# Patient Record
Sex: Male | Born: 1955 | Race: Black or African American | Hispanic: No | Marital: Single | Smoking: Former smoker
Health system: Southern US, Community
[De-identification: ages and names within clinical notes are randomized; demographics above are authoritative.]

## PROBLEM LIST (undated history)

## (undated) DIAGNOSIS — I10 Essential (primary) hypertension: Secondary | ICD-10-CM

## (undated) DIAGNOSIS — I209 Angina pectoris, unspecified: Secondary | ICD-10-CM

## (undated) DIAGNOSIS — F1010 Alcohol abuse, uncomplicated: Secondary | ICD-10-CM

## (undated) DIAGNOSIS — N189 Chronic kidney disease, unspecified: Secondary | ICD-10-CM

## (undated) DIAGNOSIS — C61 Malignant neoplasm of prostate: Secondary | ICD-10-CM

## (undated) DIAGNOSIS — E785 Hyperlipidemia, unspecified: Secondary | ICD-10-CM

## (undated) HISTORY — PX: PROSTATE BIOPSY: SHX241

## (undated) HISTORY — DX: Hyperlipidemia, unspecified: E78.5

## (undated) HISTORY — DX: Alcohol abuse, uncomplicated: F10.10

---

## 2002-03-24 ENCOUNTER — Emergency Department (HOSPITAL_COMMUNITY): Admission: EM | Admit: 2002-03-24 | Discharge: 2002-03-24 | Payer: Self-pay | Admitting: Emergency Medicine

## 2004-10-02 ENCOUNTER — Emergency Department (HOSPITAL_COMMUNITY): Admission: EM | Admit: 2004-10-02 | Discharge: 2004-10-02 | Payer: Self-pay | Admitting: Family Medicine

## 2006-09-27 ENCOUNTER — Inpatient Hospital Stay (HOSPITAL_COMMUNITY): Admission: EM | Admit: 2006-09-27 | Discharge: 2006-09-29 | Payer: Self-pay | Admitting: Family Medicine

## 2006-10-17 ENCOUNTER — Ambulatory Visit: Payer: Self-pay | Admitting: Family Medicine

## 2006-11-03 ENCOUNTER — Ambulatory Visit: Payer: Self-pay | Admitting: Family Medicine

## 2006-11-03 LAB — CONVERTED CEMR LAB
Creatinine,U: 170.1 mg/dL
Hgb A1c MFr Bld: 6.2 % — ABNORMAL HIGH (ref 4.6–6.0)
Microalb Creat Ratio: 1.2 mg/g (ref 0.0–30.0)
Microalb, Ur: 0.2 mg/dL (ref 0.0–1.9)

## 2006-11-23 ENCOUNTER — Encounter: Admission: RE | Admit: 2006-11-23 | Discharge: 2007-02-21 | Payer: Self-pay | Admitting: Family Medicine

## 2007-09-29 DIAGNOSIS — I10 Essential (primary) hypertension: Secondary | ICD-10-CM

## 2011-08-02 ENCOUNTER — Ambulatory Visit (INDEPENDENT_AMBULATORY_CARE_PROVIDER_SITE_OTHER): Payer: BC Managed Care – PPO | Admitting: Family Medicine

## 2011-08-02 ENCOUNTER — Encounter: Payer: Self-pay | Admitting: Family Medicine

## 2011-08-02 DIAGNOSIS — E119 Type 2 diabetes mellitus without complications: Secondary | ICD-10-CM

## 2011-08-02 LAB — POCT URINALYSIS DIPSTICK
Leukocytes, UA: NEGATIVE
Nitrite, UA: NEGATIVE
Spec Grav, UA: 1.015
Urobilinogen, UA: 0.2
pH, UA: 5

## 2011-08-02 LAB — GLUCOSE, POCT (MANUAL RESULT ENTRY): POC Glucose: 460

## 2011-08-02 MED ORDER — METFORMIN HCL 1000 MG PO TABS: 1000.0000 mg | ORAL_TABLET | Freq: Two times a day (BID) | ORAL | Status: DC

## 2011-08-02 MED ORDER — POLYETHYLENE GLYCOL 3350 17 GM/SCOOP PO POWD: 17.0000 g | Freq: Every day | ORAL | Status: AC

## 2011-08-02 NOTE — Progress Notes (Signed)
  Subjective:    Patient ID: Kenneth [name], male    DOB: 1956-11-18, 55 y.o.   MRN: [id number]  HPI 55 yr old male to re-establish with [place] after an absence of almost 5 years. He had lost his medical insurance and so stopped seeing [place], but recently he got insurance again. 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 He has diabetes, and when we last saw him in 2007 he was doing well on Avandamet and Lantus. His last A1c then was 6.2. He has been off all meds for years now, and it sounds like he does not watch his diet very closely. He does not check his glucoses either. He has felt very fatigued for the past few months, he has lost some weight, and he has been very constipated.    Review of Systems  Constitutional: Positive for unexpected weight change.  Respiratory: Negative.   Cardiovascular: Negative.   Gastrointestinal: Positive for constipation. Negative for nausea, vomiting, abdominal pain and abdominal distention.       Objective:   Physical Exam  Constitutional: He appears well-developed and well-nourished.  Neck: No thyromegaly present.  Cardiovascular: Normal rate, regular rhythm, normal heart sounds and intact distal pulses.   Pulmonary/Chest: Effort normal and breath sounds normal.  Abdominal: Soft. Bowel sounds are normal. He exhibits no distension and no mass. There is no tenderness. There is no rebound and no guarding.  Lymphadenopathy:    He has no cervical adenopathy.          Assessment & Plan:  His diabetes is out of control, and this probably explains the fatigue and the weight loss. Get back on Metformin again, and get more detailed labs today. Add Miralax for  the constipation. Recheck in one week.

## 2011-08-03 ENCOUNTER — Inpatient Hospital Stay (HOSPITAL_COMMUNITY)
Admission: EM | Admit: 2011-08-03 | Discharge: 2011-08-05 | DRG: 566 | Disposition: A | Discharge status: Home / Self Care | Payer: BC Managed Care – PPO | Attending: Internal Medicine | Admitting: Internal Medicine

## 2011-08-03 ENCOUNTER — Emergency Department (HOSPITAL_COMMUNITY): Payer: BC Managed Care – PPO

## 2011-08-03 DIAGNOSIS — E1310 Other specified diabetes mellitus with ketoacidosis without coma: Principal | ICD-10-CM | POA: Diagnosis present

## 2011-08-03 DIAGNOSIS — Z794 Long term (current) use of insulin: Secondary | ICD-10-CM

## 2011-08-03 DIAGNOSIS — N179 Acute kidney failure, unspecified: Secondary | ICD-10-CM | POA: Diagnosis present

## 2011-08-03 DIAGNOSIS — Z91199 Patient's noncompliance with other medical treatment and regimen due to unspecified reason: Secondary | ICD-10-CM

## 2011-08-03 DIAGNOSIS — E785 Hyperlipidemia, unspecified: Secondary | ICD-10-CM | POA: Diagnosis present

## 2011-08-03 DIAGNOSIS — E876 Hypokalemia: Secondary | ICD-10-CM | POA: Diagnosis present

## 2011-08-03 DIAGNOSIS — I10 Essential (primary) hypertension: Secondary | ICD-10-CM | POA: Diagnosis present

## 2011-08-03 DIAGNOSIS — Z833 Family history of diabetes mellitus: Secondary | ICD-10-CM

## 2011-08-03 DIAGNOSIS — Z9119 Patient's noncompliance with other medical treatment and regimen: Secondary | ICD-10-CM

## 2011-08-03 DIAGNOSIS — F17200 Nicotine dependence, unspecified, uncomplicated: Secondary | ICD-10-CM | POA: Diagnosis present

## 2011-08-03 LAB — URINALYSIS, ROUTINE W REFLEX MICROSCOPIC
Glucose, UA: >1000 mg/dL — AB
Ketones, ur: >80 mg/dL — AB
Leukocytes, UA: NEGATIVE
Nitrite: NEGATIVE
Protein, ur: 30 mg/dL — AB
Specific Gravity, Urine: 1.025 (ref 1.005–1.030)
Urobilinogen, UA: 0.2 mg/dL (ref 0.0–1.0)
pH: 5 (ref 5.0–8.0)

## 2011-08-03 LAB — DIFFERENTIAL
Basophils Absolute: 0 10*3/uL (ref 0.0–0.1)
Basophils Relative: 0 % (ref 0–1)
Eosinophils Absolute: 0 10*3/uL (ref 0.0–0.7)
Eosinophils Relative: 0 % (ref 0–5)
Lymphocytes Relative: 12 % (ref 12–46)
Lymphs Abs: 2 10*3/uL (ref 0.7–4.0)
Monocytes Absolute: 0.9 10*3/uL (ref 0.1–1.0)
Monocytes Relative: 5 % (ref 3–12)
Neutro Abs: 13.9 10*3/uL — ABNORMAL HIGH (ref 1.7–7.7)
Neutrophils Relative %: 83 % — ABNORMAL HIGH (ref 43–77)

## 2011-08-03 LAB — CBC
HCT: 43.6 % (ref 39.0–52.0)
Hemoglobin: 15.3 g/dL (ref 13.0–17.0)
MCH: 32.8 pg (ref 26.0–34.0)
MCHC: 35.1 g/dL (ref 30.0–36.0)
MCV: 93.4 fL (ref 78.0–100.0)
Platelets: 355 10*3/uL (ref 150–400)
RBC: 4.67 MIL/uL (ref 4.22–5.81)
RDW: 11.9 % (ref 11.5–15.5)
WBC: 16.8 10*3/uL — ABNORMAL HIGH (ref 4.0–10.5)

## 2011-08-03 LAB — POCT I-STAT 3, ART BLOOD GAS (G3+)
Acid-base deficit: 21 mmol/L — ABNORMAL HIGH (ref 0.0–2.0)
Bicarbonate: 6.7 mEq/L — ABNORMAL LOW (ref 20.0–24.0)
O2 Saturation: 94 %
Patient temperature: 98
TCO2: 7 mmol/L (ref 0–100)
pCO2 arterial: 21.2 mmHg — ABNORMAL LOW (ref 35.0–45.0)
pH, Arterial: 7.105 — CL (ref 7.350–7.450)
pO2, Arterial: 91 mmHg (ref 80.0–100.0)

## 2011-08-03 LAB — COMPREHENSIVE METABOLIC PANEL
ALT: 17 U/L (ref 0–53)
AST: 7 U/L (ref 0–37)
Albumin: 4.5 g/dL (ref 3.5–5.2)
Alkaline Phosphatase: 98 U/L (ref 39–117)
BUN: 17 mg/dL (ref 6–23)
CO2: 10 mEq/L — CL (ref 19–32)
Calcium: 9.9 mg/dL (ref 8.4–10.5)
Chloride: 94 mEq/L — ABNORMAL LOW (ref 96–112)
Creatinine, Ser: 1.93 mg/dL — ABNORMAL HIGH (ref 0.50–1.35)
GFR calc Af Amer: 44 mL/min — ABNORMAL LOW (ref >60)
GFR calc non Af Amer: 37 mL/min — ABNORMAL LOW (ref >60)
Glucose, Bld: 555 mg/dL (ref 70–99)
Potassium: 4.9 mEq/L (ref 3.5–5.1)
Sodium: 134 mEq/L — ABNORMAL LOW (ref 135–145)
Total Bilirubin: 0.4 mg/dL (ref 0.3–1.2)
Total Protein: 8.7 g/dL — ABNORMAL HIGH (ref 6.0–8.3)

## 2011-08-03 LAB — BASIC METABOLIC PANEL
BUN: 14 mg/dL (ref 6–23)
CO2: 15 mEq/L — ABNORMAL LOW (ref 19–32)
Calcium: 9.3 mg/dL (ref 8.4–10.5)
Chloride: 97 mEq/L (ref 96–112)
Creatinine, Ser: 1.3 mg/dL (ref 0.4–1.5)
GFR: 73.71 mL/min (ref >60.00)
Glucose, Bld: 481 mg/dL — ABNORMAL HIGH (ref 70–99)
Potassium: 4.6 mEq/L (ref 3.5–5.1)
Sodium: 132 mEq/L — ABNORMAL LOW (ref 135–145)

## 2011-08-03 LAB — GLUCOSE, CAPILLARY
Glucose-Capillary: 503 mg/dL — ABNORMAL HIGH (ref 70–99)
Glucose-Capillary: 462 mg/dL — ABNORMAL HIGH (ref 70–99)

## 2011-08-03 LAB — CBC WITH DIFFERENTIAL/PLATELET
Basophils Absolute: 0.2 10*3/uL — ABNORMAL HIGH (ref 0.0–0.1)
Basophils Relative: 1.6 % (ref 0.0–3.0)
Eosinophils Absolute: 0 10*3/uL (ref 0.0–0.7)
Eosinophils Relative: 0.2 % (ref 0.0–5.0)
HCT: 40.7 % (ref 39.0–52.0)
Hemoglobin: 13.8 g/dL (ref 13.0–17.0)
Lymphocytes Relative: 24.4 % (ref 12.0–46.0)
Lymphs Abs: 2.2 10*3/uL (ref 0.7–4.0)
MCHC: 33.9 g/dL (ref 30.0–36.0)
MCV: 95.1 fl (ref 78.0–100.0)
Monocytes Absolute: 0.4 10*3/uL (ref 0.1–1.0)
Monocytes Relative: 4.5 % (ref 3.0–12.0)
Neutro Abs: 6.3 10*3/uL (ref 1.4–7.7)
Neutrophils Relative %: 69.3 % (ref 43.0–77.0)
Platelets: 266 10*3/uL (ref 150.0–400.0)
RBC: 4.28 Mil/uL (ref 4.22–5.81)
RDW: 12.4 % (ref 11.5–14.6)
WBC: 9.1 10*3/uL (ref 4.5–10.5)

## 2011-08-03 LAB — HEPATIC FUNCTION PANEL
ALT: 22 U/L (ref 0–53)
AST: 12 U/L (ref 0–37)
Albumin: 4.3 g/dL (ref 3.5–5.2)
Alkaline Phosphatase: 82 U/L (ref 39–117)
Bilirubin, Direct: 0.1 mg/dL (ref 0.0–0.3)
Total Bilirubin: 1.4 mg/dL — ABNORMAL HIGH (ref 0.3–1.2)
Total Protein: 7.8 g/dL (ref 6.0–8.3)

## 2011-08-03 LAB — POCT I-STAT 3, VENOUS BLOOD GAS (G3P V)
Acid-base deficit: 21 mmol/L — ABNORMAL HIGH (ref 0.0–2.0)
Bicarbonate: 9.8 mEq/L — ABNORMAL LOW (ref 20.0–24.0)
O2 Saturation: 40 %
TCO2: 11 mmol/L (ref 0–100)
pCO2, Ven: 41.3 mmHg — ABNORMAL LOW (ref 45.0–50.0)
pH, Ven: 6.984 — CL (ref 7.250–7.300)
pO2, Ven: 35 mmHg (ref 30.0–45.0)

## 2011-08-03 LAB — URINE MICROSCOPIC-ADD ON

## 2011-08-03 LAB — MICROALBUMIN / CREATININE URINE RATIO
Creatinine,U: 38.1 mg/dL
Microalb Creat Ratio: 11.6 mg/g (ref 0.0–30.0)
Microalb, Ur: 4.4 mg/dL — ABNORMAL HIGH (ref 0.0–1.9)

## 2011-08-03 LAB — HEMOGLOBIN A1C: Hgb A1c MFr Bld: 12.3 % — ABNORMAL HIGH (ref 4.6–6.5)

## 2011-08-03 LAB — TSH: TSH: 1.57 u[IU]/mL (ref 0.35–5.50)

## 2011-08-04 ENCOUNTER — Telehealth: Payer: Self-pay | Admitting: *Deleted

## 2011-08-04 ENCOUNTER — Inpatient Hospital Stay (HOSPITAL_COMMUNITY): Payer: BC Managed Care – PPO

## 2011-08-04 LAB — GLUCOSE, CAPILLARY
Glucose-Capillary: 301 mg/dL — ABNORMAL HIGH (ref 70–99)
Glucose-Capillary: 357 mg/dL — ABNORMAL HIGH (ref 70–99)
Glucose-Capillary: 445 mg/dL — ABNORMAL HIGH (ref 70–99)
Glucose-Capillary: 265 mg/dL — ABNORMAL HIGH (ref 70–99)
Glucose-Capillary: 248 mg/dL — ABNORMAL HIGH (ref 70–99)
Glucose-Capillary: 250 mg/dL — ABNORMAL HIGH (ref 70–99)
Glucose-Capillary: 253 mg/dL — ABNORMAL HIGH (ref 70–99)
Glucose-Capillary: 196 mg/dL — ABNORMAL HIGH (ref 70–99)
Glucose-Capillary: 237 mg/dL — ABNORMAL HIGH (ref 70–99)
Glucose-Capillary: 212 mg/dL — ABNORMAL HIGH (ref 70–99)

## 2011-08-04 LAB — LIPID PANEL
Cholesterol: 218 mg/dL — ABNORMAL HIGH (ref 0–200)
HDL: 40 mg/dL (ref >39)
LDL Cholesterol: 149 mg/dL — ABNORMAL HIGH (ref 0–99)
Total CHOL/HDL Ratio: 5.5 RATIO
Triglycerides: 147 mg/dL (ref <150)
VLDL: 29 mg/dL (ref 0–40)

## 2011-08-04 LAB — BASIC METABOLIC PANEL
BUN: 12 mg/dL (ref 6–23)
CO2: 12 mEq/L — ABNORMAL LOW (ref 19–32)
CO2: 18 mEq/L — ABNORMAL LOW (ref 19–32)
CO2: 22 mEq/L (ref 19–32)
Calcium: 9.2 mg/dL (ref 8.4–10.5)
Calcium: 9 mg/dL (ref 8.4–10.5)
Chloride: 109 mEq/L (ref 96–112)
Creatinine, Ser: 1.41 mg/dL — ABNORMAL HIGH (ref 0.50–1.35)
Creatinine, Ser: 1.14 mg/dL (ref 0.50–1.35)
Creatinine, Ser: 0.99 mg/dL (ref 0.50–1.35)
GFR calc Af Amer: >60 mL/min (ref >60)
GFR calc Af Amer: >60 mL/min (ref >60)
GFR calc non Af Amer: >60 mL/min (ref >60)
Glucose, Bld: 265 mg/dL — ABNORMAL HIGH (ref 70–99)
Potassium: 3.8 mEq/L (ref 3.5–5.1)
Sodium: 141 mEq/L (ref 135–145)
Sodium: 145 mEq/L (ref 135–145)

## 2011-08-04 LAB — CBC
HCT: 35.7 % — ABNORMAL LOW (ref 39.0–52.0)
Hemoglobin: 12.6 g/dL — ABNORMAL LOW (ref 13.0–17.0)
MCH: 31.7 pg (ref 26.0–34.0)
MCHC: 35.3 g/dL (ref 30.0–36.0)
RDW: 11.8 % (ref 11.5–15.5)

## 2011-08-04 LAB — HEMOGLOBIN A1C
Hgb A1c MFr Bld: 13 % — ABNORMAL HIGH (ref <5.7)
Mean Plasma Glucose: 326 mg/dL — ABNORMAL HIGH (ref <117)

## 2011-08-04 LAB — DIFFERENTIAL
Basophils Relative: 0 % (ref 0–1)
Eosinophils Relative: 0 % (ref 0–5)
Lymphocytes Relative: 19 % (ref 12–46)
Monocytes Absolute: 0.9 10*3/uL (ref 0.1–1.0)
Monocytes Relative: 8 % (ref 3–12)
Neutro Abs: 8.2 10*3/uL — ABNORMAL HIGH (ref 1.7–7.7)

## 2011-08-04 LAB — RAPID URINE DRUG SCREEN, HOSP PERFORMED
Amphetamines: NOT DETECTED
Barbiturates: NOT DETECTED
Benzodiazepines: NOT DETECTED
Cocaine: NOT DETECTED
Opiates: NOT DETECTED
Tetrahydrocannabinol: NOT DETECTED

## 2011-08-04 LAB — CARDIAC PANEL(CRET KIN+CKTOT+MB+TROPI)
CK, MB: 5.2 ng/mL — ABNORMAL HIGH (ref 0.3–4.0)
Relative Index: 2.9 — ABNORMAL HIGH (ref 0.0–2.5)
Relative Index: 2.9 — ABNORMAL HIGH (ref 0.0–2.5)
Total CK: 180 U/L (ref 7–232)
Troponin I: <0.3 ng/mL (ref <0.30)

## 2011-08-04 LAB — MRSA PCR SCREENING: MRSA by PCR: NEGATIVE

## 2011-08-04 NOTE — Telephone Encounter (Signed)
[facility] Triage Call Report Triage Record Num: [id number] Operator: Audelia Hives Patient Name: Chandra Feger Call Date & Time: 08/03/2011 6:28:34PM Patient Phone: [phone] PCP: Tera Mater. Clent Ridges Patient Gender: Male PCP Fax : [phone] Patient DOB: 1956/09/10 [facility] Name[facility] Reason for Call: Greta, Significant Other, calling regarding Other. PCP is Bernie Covey number is [phone]. Greta/Spouse calling to report that she is in route to the ED [place]. Spoke with RN earlier and pt did not want to go via EMS. Pt has been vomiting and BS is elevated. See prior triage. Protocol(s) Used: Office Note Recommended Outcome per Protocol: Information Noted and Sent to Office Reason for Outcome: Caller information to office Care Advice: ~ 08/03/2011 6:32:44PM Page 1 of 1 C

## 2011-08-04 NOTE — Telephone Encounter (Signed)
[facility] Triage Call Report Triage Record Num: [id number] Operator: Lodema Pilot Patient Name: Kenneth [name] Call Date & Time: 08/03/2011 5:21:57PM Patient Phone: [phone] PCP: Tera Mater. Clent Ridges Patient Gender: Male PCP Fax : [phone] Patient DOB: 1956-05-13 [facility] Name: [facility] Reason for Call: PCP is Bernie Covey number is [phone]. Greta, Significant Other, calling regarding DM and Vomiting. Pt was seen in office on Friday 07/30/11. Pt was prescribed Metformin and BG was 460. Pt took Metformin 08/02/11 HS and vomited. Pt was sent home from work today 08/03/11 due to weakness and vomiting. BG unknown. Per Diabetes: Gastrointestinal Problems, advised fiance to call 911. Care advice given. Protocol(s) Used: Diabetes: Gastrointestinal Problems Recommended Outcome per Protocol: Activate EMS 911 Reason for Outcome: New or worsening signs and symptoms that may indicate shock Care Advice: ~ If testing equipment is available, measure blood sugar AFTER calling EMS 911. ~ An adult should stay with the patient, preferably one trained in CPR. ~ If available, bring recent log of blood sugars or bring blood glucose monitor with log of blood sugars. Lay the person down and elevate legs at least 12 inches (30 cm) above level of heart. Cover to help maintain body temperature. ~ ~ IMMEDIATE ACTION Write down provider's name. List or place the following in a bag for transport with the patient: current prescription and/or nonprescription medications; alternative treatments, therapies and medications; and street drugs. ~ 08/03/2011 5:46:06PM Page 1 of 1 [id number]

## 2011-08-05 ENCOUNTER — Telehealth: Payer: Self-pay | Admitting: Family Medicine

## 2011-08-05 LAB — BASIC METABOLIC PANEL
BUN: 9 mg/dL (ref 6–23)
CO2: 20 mEq/L (ref 19–32)
Calcium: 8.2 mg/dL — ABNORMAL LOW (ref 8.4–10.5)
Creatinine, Ser: 0.88 mg/dL (ref 0.50–1.35)
Glucose, Bld: 222 mg/dL — ABNORMAL HIGH (ref 70–99)

## 2011-08-05 LAB — CBC
Hemoglobin: 10.1 g/dL — ABNORMAL LOW (ref 13.0–17.0)
MCH: 32.1 pg (ref 26.0–34.0)
MCV: 89.5 fL (ref 78.0–100.0)
RBC: 3.15 MIL/uL — ABNORMAL LOW (ref 4.22–5.81)

## 2011-08-05 LAB — GLUCOSE, CAPILLARY

## 2011-08-05 NOTE — Telephone Encounter (Signed)
Spoke with pt and gave results. 

## 2011-08-05 NOTE — Telephone Encounter (Signed)
Message copied by Baldemar Friday on Thu Aug 05, 2011  1:00 PM ------      Message from: Gershon Crane A      Created: Thu Aug 05, 2011  5:45 AM       Labs are normal except for his diabetes, which is quite out of control. See me again this week, drink lots of water. He will need to be on insulin from now on.

## 2011-08-09 ENCOUNTER — Ambulatory Visit (INDEPENDENT_AMBULATORY_CARE_PROVIDER_SITE_OTHER): Payer: BC Managed Care – PPO | Admitting: Family Medicine

## 2011-08-09 ENCOUNTER — Encounter: Payer: Self-pay | Admitting: Family Medicine

## 2011-08-09 VITALS — BP 110/64 | HR 88 | Temp 98.4°F | Wt 203.0 lb

## 2011-08-09 DIAGNOSIS — E119 Type 2 diabetes mellitus without complications: Secondary | ICD-10-CM

## 2011-08-09 MED ORDER — LISINOPRIL 5 MG PO TABS: 2.5000 mg | ORAL_TABLET | Freq: Every day | ORAL | Status: DC

## 2011-08-09 MED ORDER — INSULIN GLARGINE 100 UNIT/ML ~~LOC~~ SOLN: 20.0000 [IU] | Freq: Every day | SUBCUTANEOUS | Status: DC

## 2011-08-09 MED ORDER — INSULIN ASPART 100 UNIT/ML ~~LOC~~ SOLN: 10.0000 [IU] | Freq: Three times a day (TID) | SUBCUTANEOUS | Status: DC

## 2011-08-09 NOTE — Progress Notes (Signed)
  Subjective:    Patient ID: Kenneth [name], male    DOB: [birth year], 55 y.o.   MRN: [id number]  HPI Here to follow up on an overnight stay at [facility] from 08-03-11 to 08-04-11 for DKA and newly diagnosed diabetes. His A1c was 13.0. He was started on insulin, Lisinopril, and Zocor. He feels much better now. His glucoses at home are running in the 200s most of the time.    Review of Systems  Constitutional: Negative.   Respiratory: Negative.   Cardiovascular: Negative.        Objective:   Physical Exam  Constitutional: He appears well-developed and well-nourished.  Cardiovascular: Normal rate, regular rhythm, normal heart sounds and intact distal pulses.   Pulmonary/Chest: Effort normal and breath sounds normal. No respiratory distress. He has no wheezes. He has no rales. He exhibits no tenderness.          Assessment & Plan:  He is doing better. We will adjust the insulins as above. Recheck in 2 weeks.

## 2011-08-10 LAB — CULTURE, BLOOD (ROUTINE X 2)
Culture  Setup Time: 201208150916
Culture: NO GROWTH

## 2011-08-24 ENCOUNTER — Encounter: Payer: Self-pay | Admitting: Family Medicine

## 2011-08-24 ENCOUNTER — Ambulatory Visit (INDEPENDENT_AMBULATORY_CARE_PROVIDER_SITE_OTHER): Payer: BC Managed Care – PPO | Admitting: Family Medicine

## 2011-08-24 VITALS — BP 108/62 | HR 81 | Temp 98.3°F | Wt 201.0 lb

## 2011-08-24 DIAGNOSIS — E119 Type 2 diabetes mellitus without complications: Secondary | ICD-10-CM

## 2011-08-24 DIAGNOSIS — I10 Essential (primary) hypertension: Secondary | ICD-10-CM

## 2011-08-24 MED ORDER — SIMVASTATIN 20 MG PO TABS: 20.0000 mg | ORAL_TABLET | Freq: Every day | ORAL | Status: DC

## 2011-08-24 MED ORDER — LISINOPRIL 2.5 MG PO TABS: 2.5000 mg | ORAL_TABLET | Freq: Every day | ORAL | Status: DC

## 2011-08-24 NOTE — Progress Notes (Signed)
  Subjective:    Patient ID: Kenneth [name], male    DOB: February 14, 1956, 55 y.o.   MRN: [id number]  HPI Here to follow up on diabetes. He feels well, and he has made some big adjustments to his diet. He has decreased his dose of Novolog from 10 units to 7. His fasting glucoses usually run 120-140.   Review of Systems  Constitutional: Negative.   Respiratory: Negative.   Cardiovascular: Negative.        Objective:   Physical Exam  Constitutional: He appears well-developed and well-nourished.  Cardiovascular: Normal rate, regular rhythm, normal heart sounds and intact distal pulses.   Pulmonary/Chest: Effort normal and breath sounds normal.          Assessment & Plan:  He seems to be stable now. Continue current meds and recheck with an A1c in early November.

## 2011-08-24 NOTE — H&P (Signed)
Kenneth [name], Kenneth [name] NO.:  [id number]  MEDICAL RECORD NO.:  [id number]  LOCATION:  [facility]                         FACILITY:  [facility]  PHYSICIAN:  Eduard Clos, MDDATE OF BIRTH:  07-May-1956  DATE OF ADMISSION:  08/03/2011 DATE OF DISCHARGE:                             HISTORY & PHYSICAL   PRIMARY CARE PHYSICIAN:  Jeannett Senior A. Clent Ridges, MD  CHIEF COMPLAINT:  Increased blood sugar.  HISTORY OF PRESENT ILLNESS:  This is a 55 year old male with a history diabetes mellitus type 2 who was not taking him medicines for almost last 2 years having increasing weakness, urinary frequency, and fatigue and gone to his primary care yesterday and was prescribed some medications tablet.  Previously, he was on Lantus and metformin which he has not been taking for almost 2 years due to insurance issues.  In the ER, the patient was found to have blood sugars in the 500s with anion gap.  The patient is admitted for DKA.  The patient denies any chest pain, shortness of breath, cough, or phlegm.  Denies any dizziness, loss of consciousness, or any focal deficits.  He has been having nausea and vomiting.  Denies any abdominal pain, any dysuria, discharge, or diarrhea.  He has been having constipation.  Denies any headache or visual symptoms or any difficulty speaking or swallowing.  PAST MEDICAL HISTORY:  Diabetes mellitus type 2.  SOCIAL HISTORY:  The patient smokes cigarettes.  He has been advised to quit smoking.  Drinks alcohol occasionally, usually 1-2 beers a week. Denies any drug abuse.  He is married, lives with his wife.  FAMILY HISTORY:  Significant for diabetes in both sides of the family.  ALLERGIES:  No known drug allergies.  REVIEW OF SYSTEMS:  As per history of present illness, nothing else significant.  PHYSICAL EXAMINATION:  GENERAL:  The patient is examined bedside, not in acute distress. VITAL SIGNS:  Blood pressure 120/60, pulse 119 per minute,  temperature 98.3, respirations 20 per minute, and O2 sat is 98%. HEENT:  Anicteric.  No pallor.  No discharge from ears, eyes, nose, or mouth. CHEST:  Bilateral entry present.  No rhonchi.  No crepitation. HEART:  S1 and S2 heard. ABDOMEN:  Soft and nontender.  Bowel sounds heard. CENTRAL NERVOUS SYSTEM:  Alert, awake, and oriented to time, place, and person.  Moves upper and lower extremities 5/5. EXTREMITIES:  Peripheral pulses are felt.  No edema.  LABORATORY DATA:  EKG has been ordered.  Abdominal x-ray shows no significant abnormality identified.  ABG:  PH is 7.1, pCO2 is 21.2, pO2 is 91, and oxygen saturation 94% on room air.  CBC:  WBC 16.8, hemoglobin is 15.3, hematocrit 43.6, and platelets 335.  Basic metabolic panel:  Sodium 134, potassium 4.9, chloride 94, carbon dioxide 10, anion gap is 30, glucose 555, BUN 17, creatinine 1.9, total bilirubin is 0.4, alkaline phosphatase 98, AST 7, ALT 17, total protein 8.7, albumin 4.5, calcium 9.9.  Hemoglobin A1c is 12.3.  Microalbumin is 4.4.  UA shows glucose more than 1000, ketones more than 80, bilirubin small, blood moderate, nitrites negative, leukocytes negative, squamous cells rare, granular and hyaline casts, wbc 0, bacteria rare.  ASSESSMENT: 1. Diabetic ketoacidosis. 2. Acute renal failure. 3. Nausea and vomiting probably from diabetic ketoacidosis reason.  PLAN: 1. At this time, we will admit the patient to intensive care unit. 2. For DKA, at this time we are going to aggressively hydrate the     patient with IV fluids.  The patient will be on DKA protocol with     IV insulin with frequent BMET checks.  We will repeat his CBC and     mag level again in the a.m.  We will keep the patient on strict     intake, output, and daily weights.  His acute renal failure is     probably from his dehydration which again I think will improve with     hydration.  As advised earlier, we will closely follow strict     intake, output,  and daily weight. 3. He is in DKA probably because he did not take his medications.  I     do not see any definite protruding cause.  At this time, the     patient is having no chest pain or cough or phlegm and he is not     febrile.  But in any case, I am going to check cardiac enzymes.  I     am going to check EKG.  We will continue with his IV insulin until     his anion gap is corrected.  The patient will need diabetes     education.     Eduard Clos, MD     ANK/MEDQ  D:  08/03/2011  T:  08/03/2011  Job:  [id number]  cc:   Jeannett Senior A. Clent Ridges, MD  Electronically Signed by Midge Minium MD on 08/24/2011 09:24:52 AM

## 2011-09-15 NOTE — Discharge Summary (Signed)
  Kenneth, [name]              ACCOUNT NO.:  [id number]  MEDICAL RECORD NO.:  [id number]  LOCATION:  4501                         FACILITY:  [facility]  PHYSICIAN:  Rafia Shedden I Nou Chard, MD      DATE OF BIRTH:  24-Apr-1956  DATE OF ADMISSION:  08/03/2011 DATE OF DISCHARGE:  08/05/2011                              DISCHARGE SUMMARY   PRIMARY CARE PHYSICIAN:  Jeannett Senior A. Clent Ridges, MD  DISCHARGE DIAGNOSES: 1. Diabetic ketoacidosis, resolved. 2. Diabetes mellitus with hemoglobin A1c of 13. 3. Noncompliance. 4. Acute renal failure, resolved. 5. Hypokalemia. 6. Hyperlipidemia.  DISCHARGE MEDICATIONS: 1. Insulin Lantus 20 units subcu daily. 2. NovoLog 3 units subcu t.i.d. every morning. 3. Lisinopril 2.5 mg p.o. daily. 4. Zocor 20 mg p.o. at bedtime.  PROCEDURE: 1. Chest x-ray, chronic bibasilar atelectasis scarring, no acute     cardiopulmonary process. 2. Abdominal x-ray, no significant abnormality.  CONSULTATION:  None.  HISTORY OF PRESENT ILLNESS:  This is a 55 year old male with a history diabetes mellitus type 2 who was not taking his medications for almost last 2 years presented with weakness and increased urine frequency, and fatigue.  The patient presented to the ED, found to have a blood sugar of 500 with anion gap and accordingly, the patient admitted.  His vitals signs, blood pressure 120/60, pulse rate 190, temperature 98.3.  The patient found to have a BUN of 17, creatinine 1.9, sodium 134, potassium 4.9, carbon dioxide 10, anion gap of 30, total protein 8.7, albumin 4.5, and calcium 9.9 and had microalbumin of 4.4.  Urinalysis, glucose more than 1000 and ketone more than 80.  Diagnosis of diabetic ketoacidosis made. 1. The patient DKA followed standard protocol of insulin IV.  Anion     gap closed and the patient started on insulin Lantus 15 units     subcu.  He is still with significant CBG around 206 and 273.     Accordingly, insulin Lantus increased to 20 units subcu  daily and     NovoLog 3 unit with meal done during hospital stay.  Education done     and the patient admitted.  He does not need any education regarding     insulin administration.  Currently, we felt the patient is stable     to be discharge, need to follow up closely with his primary care     physician for further adjustment of his insulin.     [name], MD     HIE/MEDQ  D:  08/05/2011  T:  08/05/2011  Job:  [id number]  cc:   Jeannett Senior A. Clent Ridges, MD  Electronically Signed by Ebony Cargo MD on 09/15/2011 10:27:01 AM

## 2011-09-20 ENCOUNTER — Other Ambulatory Visit: Payer: Self-pay | Admitting: Family Medicine

## 2011-09-20 NOTE — Telephone Encounter (Signed)
Pt needs refill on novolog call into [facility] [phone]

## 2011-09-22 MED ORDER — INSULIN ASPART 100 UNIT/ML ~~LOC~~ SOLN: 10.0000 [IU] | Freq: Three times a day (TID) | SUBCUTANEOUS | Status: DC

## 2011-09-22 NOTE — Telephone Encounter (Signed)
rx called into pharmacy

## 2011-09-22 NOTE — Telephone Encounter (Signed)
Pt will be out of novolog today.

## 2011-10-25 ENCOUNTER — Ambulatory Visit: Payer: BC Managed Care – PPO | Admitting: Family Medicine

## 2011-10-28 ENCOUNTER — Encounter: Payer: Self-pay | Admitting: Family Medicine

## 2011-10-28 ENCOUNTER — Ambulatory Visit (INDEPENDENT_AMBULATORY_CARE_PROVIDER_SITE_OTHER): Payer: BC Managed Care – PPO | Admitting: Family Medicine

## 2011-10-28 VITALS — BP 130/82 | HR 83 | Temp 98.5°F | Wt 217.0 lb

## 2011-10-28 DIAGNOSIS — E119 Type 2 diabetes mellitus without complications: Secondary | ICD-10-CM

## 2011-10-28 DIAGNOSIS — E785 Hyperlipidemia, unspecified: Secondary | ICD-10-CM | POA: Insufficient documentation

## 2011-10-28 DIAGNOSIS — I10 Essential (primary) hypertension: Secondary | ICD-10-CM

## 2011-10-28 LAB — HEMOGLOBIN A1C: Hgb A1c MFr Bld: 6.3 % (ref 4.6–6.5)

## 2011-10-28 MED ORDER — INSULIN ASPART 100 UNIT/ML ~~LOC~~ SOLN: 7.0000 [IU] | Freq: Three times a day (TID) | SUBCUTANEOUS | Status: DC

## 2011-10-28 MED ORDER — LISINOPRIL 2.5 MG PO TABS: 2.5000 mg | ORAL_TABLET | Freq: Every day | ORAL | Status: DC

## 2011-10-28 MED ORDER — INSULIN GLARGINE 100 UNIT/ML ~~LOC~~ SOLN: 15.0000 [IU] | Freq: Every day | SUBCUTANEOUS | Status: DC

## 2011-10-28 MED ORDER — SIMVASTATIN 20 MG PO TABS: 20.0000 mg | ORAL_TABLET | Freq: Every day | ORAL | Status: DC

## 2011-10-28 NOTE — Progress Notes (Signed)
  Subjective:    Patient ID: Kenneth [name], male    DOB: 12-09-1956, 55 y.o.   MRN: [id number]  HPI Here to follow up on diabetes. He feels well. His glucoses run from 120 to 150 most of the time.   Review of Systems  Constitutional: Negative.   Respiratory: Negative.   Cardiovascular: Negative.        Objective:   Physical Exam  Constitutional: He appears well-developed and well-nourished.  Neck: No thyromegaly present.  Cardiovascular: Normal rate, regular rhythm, normal heart sounds and intact distal pulses.   Pulmonary/Chest: Effort normal and breath sounds normal. No respiratory distress. He has no wheezes. He has no rales. He exhibits no tenderness.  Lymphadenopathy:    He has no cervical adenopathy.          Assessment & Plan:  Get an A1c. It sounds like he is doing well.

## 2011-11-01 NOTE — Progress Notes (Signed)
Quick Note:  Spoke with pt and gave results. Also put future lab order in computer. ______

## 2011-11-01 NOTE — Progress Notes (Signed)
Addended by: Aniceto Boss A on: 11/01/2011 03:32 PM   Modules accepted: Orders

## 2011-12-27 ENCOUNTER — Other Ambulatory Visit: Payer: Self-pay | Admitting: Internal Medicine

## 2012-02-05 ENCOUNTER — Inpatient Hospital Stay (HOSPITAL_COMMUNITY)
Admission: EM | Admit: 2012-02-05 | Discharge: 2012-02-08 | DRG: 566 | Disposition: A | Discharge status: Home / Self Care | Payer: BC Managed Care – PPO | Attending: Internal Medicine | Admitting: Internal Medicine

## 2012-02-05 ENCOUNTER — Other Ambulatory Visit: Payer: Self-pay

## 2012-02-05 ENCOUNTER — Emergency Department (HOSPITAL_COMMUNITY): Payer: BC Managed Care – PPO

## 2012-02-05 ENCOUNTER — Encounter (HOSPITAL_COMMUNITY): Payer: Self-pay | Admitting: Emergency Medicine

## 2012-02-05 DIAGNOSIS — I10 Essential (primary) hypertension: Secondary | ICD-10-CM | POA: Diagnosis present

## 2012-02-05 DIAGNOSIS — E785 Hyperlipidemia, unspecified: Secondary | ICD-10-CM | POA: Diagnosis present

## 2012-02-05 DIAGNOSIS — E870 Hyperosmolality and hypernatremia: Secondary | ICD-10-CM | POA: Diagnosis present

## 2012-02-05 DIAGNOSIS — Z794 Long term (current) use of insulin: Secondary | ICD-10-CM

## 2012-02-05 DIAGNOSIS — Z720 Tobacco use: Secondary | ICD-10-CM | POA: Diagnosis present

## 2012-02-05 DIAGNOSIS — E1110 Type 2 diabetes mellitus with ketoacidosis without coma: Secondary | ICD-10-CM | POA: Diagnosis present

## 2012-02-05 DIAGNOSIS — F1010 Alcohol abuse, uncomplicated: Secondary | ICD-10-CM | POA: Diagnosis present

## 2012-02-05 DIAGNOSIS — Z6827 Body mass index (BMI) 27.0-27.9, adult: Secondary | ICD-10-CM

## 2012-02-05 DIAGNOSIS — E1310 Other specified diabetes mellitus with ketoacidosis without coma: Principal | ICD-10-CM | POA: Diagnosis present

## 2012-02-05 DIAGNOSIS — F17200 Nicotine dependence, unspecified, uncomplicated: Secondary | ICD-10-CM | POA: Diagnosis present

## 2012-02-05 DIAGNOSIS — D72829 Elevated white blood cell count, unspecified: Secondary | ICD-10-CM | POA: Diagnosis present

## 2012-02-05 DIAGNOSIS — E875 Hyperkalemia: Secondary | ICD-10-CM | POA: Diagnosis present

## 2012-02-05 DIAGNOSIS — J209 Acute bronchitis, unspecified: Secondary | ICD-10-CM | POA: Diagnosis present

## 2012-02-05 DIAGNOSIS — Z23 Encounter for immunization: Secondary | ICD-10-CM

## 2012-02-05 DIAGNOSIS — N179 Acute kidney failure, unspecified: Secondary | ICD-10-CM | POA: Diagnosis present

## 2012-02-05 HISTORY — DX: Essential (primary) hypertension: I10

## 2012-02-05 HISTORY — DX: Angina pectoris, unspecified: I20.9

## 2012-02-05 HISTORY — DX: Chronic kidney disease, unspecified: N18.9

## 2012-02-05 LAB — BASIC METABOLIC PANEL
BUN: 29 mg/dL — ABNORMAL HIGH (ref 6–23)
CO2: 8 mEq/L — CL (ref 19–32)
CO2: 5 mEq/L — CL (ref 19–32)
Chloride: 84 mEq/L — ABNORMAL LOW (ref 96–112)
Chloride: 102 mEq/L (ref 96–112)
Creatinine, Ser: 2 mg/dL — ABNORMAL HIGH (ref 0.50–1.35)
Creatinine, Ser: 1.4 mg/dL — ABNORMAL HIGH (ref 0.50–1.35)
GFR calc Af Amer: 65 mL/min — ABNORMAL LOW (ref >90)
Glucose, Bld: 434 mg/dL — ABNORMAL HIGH (ref 70–99)
Potassium: 5.7 mEq/L — ABNORMAL HIGH (ref 3.5–5.1)

## 2012-02-05 LAB — URINALYSIS, ROUTINE W REFLEX MICROSCOPIC
Leukocytes, UA: NEGATIVE
Protein, ur: NEGATIVE mg/dL
Urobilinogen, UA: 0.2 mg/dL (ref 0.0–1.0)

## 2012-02-05 LAB — GLUCOSE, CAPILLARY: Glucose-Capillary: 493 mg/dL — ABNORMAL HIGH (ref 70–99)

## 2012-02-05 LAB — URINE MICROSCOPIC-ADD ON

## 2012-02-05 LAB — KETONES, QUALITATIVE

## 2012-02-05 LAB — DIFFERENTIAL
Basophils Absolute: 0 10*3/uL (ref 0.0–0.1)
Lymphocytes Relative: 8 % — ABNORMAL LOW (ref 12–46)
Monocytes Absolute: 1.3 10*3/uL — ABNORMAL HIGH (ref 0.1–1.0)
Neutro Abs: 18.2 10*3/uL — ABNORMAL HIGH (ref 1.7–7.7)

## 2012-02-05 LAB — CBC
HCT: 38.2 % — ABNORMAL LOW (ref 39.0–52.0)
Hemoglobin: 12.4 g/dL — ABNORMAL LOW (ref 13.0–17.0)
RDW: 12.8 % (ref 11.5–15.5)
WBC: 21.2 10*3/uL — ABNORMAL HIGH (ref 4.0–10.5)

## 2012-02-05 MED ORDER — SODIUM CHLORIDE 0.9 % IV BOLUS (SEPSIS)
1000.0000 mL | Freq: Once | INTRAVENOUS | Status: AC
  Administered 2012-02-05: 1000 mL via INTRAVENOUS

## 2012-02-05 MED ORDER — SODIUM CHLORIDE 0.9 % IV SOLN
INTRAVENOUS | Status: DC
  Administered 2012-02-05: 5.4 [IU]/h via INTRAVENOUS
  Filled 2012-02-05: qty 1

## 2012-02-05 MED ORDER — ENOXAPARIN SODIUM 40 MG/0.4ML ~~LOC~~ SOLN
40.0000 mg | SUBCUTANEOUS | Status: DC
  Administered 2012-02-06 – 2012-02-07 (×3): 40 mg via SUBCUTANEOUS
  Filled 2012-02-05 (×5): qty 0.4

## 2012-02-05 MED ORDER — SODIUM CHLORIDE 0.9 % IV SOLN: INTRAVENOUS | Status: DC

## 2012-02-05 MED ORDER — SODIUM CHLORIDE 0.9 % IV SOLN
INTRAVENOUS | Status: DC
  Administered 2012-02-06: 02:00:00 via INTRAVENOUS
  Filled 2012-02-05 (×2): qty 1

## 2012-02-05 MED ORDER — INSULIN REGULAR BOLUS VIA INFUSION: 5.0000 [IU] | Freq: Three times a day (TID) | INTRAVENOUS | Status: DC

## 2012-02-05 MED ORDER — ONDANSETRON HCL 4 MG/2ML IJ SOLN: 4.0000 mg | Freq: Four times a day (QID) | INTRAMUSCULAR | Status: DC | PRN

## 2012-02-05 MED ORDER — ONDANSETRON HCL 4 MG/2ML IJ SOLN: 4.0000 mg | Freq: Three times a day (TID) | INTRAMUSCULAR | Status: AC | PRN

## 2012-02-05 MED ORDER — MORPHINE SULFATE 2 MG/ML IJ SOLN
2.0000 mg | INTRAMUSCULAR | Status: DC | PRN
  Administered 2012-02-06: 2 mg via INTRAVENOUS
  Filled 2012-02-05: qty 1

## 2012-02-05 MED ORDER — DEXTROSE-NACL 5-0.45 % IV SOLN
INTRAVENOUS | Status: DC
  Administered 2012-02-06: 1000 mL via INTRAVENOUS

## 2012-02-05 MED ORDER — HYDROMORPHONE HCL PF 1 MG/ML IJ SOLN
1.0000 mg | INTRAMUSCULAR | Status: AC | PRN
  Administered 2012-02-05: 1 mg via INTRAVENOUS
  Filled 2012-02-05: qty 1

## 2012-02-05 MED ORDER — DEXTROSE 50 % IV SOLN: 25.0000 mL | INTRAVENOUS | Status: DC | PRN

## 2012-02-05 MED ORDER — PANTOPRAZOLE SODIUM 40 MG IV SOLR
40.0000 mg | INTRAVENOUS | Status: DC
  Administered 2012-02-05 – 2012-02-06 (×2): 40 mg via INTRAVENOUS
  Filled 2012-02-05 (×3): qty 40

## 2012-02-05 MED ORDER — SODIUM CHLORIDE 0.9 % IV SOLN
INTRAVENOUS | Status: DC
  Administered 2012-02-05: 1000 mL via INTRAVENOUS

## 2012-02-05 MED ORDER — DEXTROSE-NACL 5-0.45 % IV SOLN: INTRAVENOUS | Status: DC

## 2012-02-05 MED ORDER — NICOTINE 21 MG/24HR TD PT24
21.0000 mg | MEDICATED_PATCH | Freq: Every day | TRANSDERMAL | Status: DC
  Administered 2012-02-06 – 2012-02-08 (×4): 21 mg via TRANSDERMAL
  Filled 2012-02-05 (×5): qty 1

## 2012-02-05 NOTE — ED Notes (Signed)
MD at bedside. EDPA Williams  

## 2012-02-05 NOTE — ED Notes (Signed)
Family at bedside. 

## 2012-02-05 NOTE — ED Provider Notes (Signed)
History     CSN: [id number]  Arrival date & time 02/05/12  1610   First MD Initiated Contact with Patient 02/05/12 1643      Chief Complaint  Patient presents with  . Hyperglycemia    (Consider location/radiation/quality/duration/timing/severity/associated sxs/prior treatment) HPI History is obtained from the patient and family member at bedside. He is an insulin-dependent diabetic. He states that he has been feeling poorly for the past 2 weeks with cough and congestion symptoms. He has been taking an over-the-counter decongestant and cough drops for his symptoms.   He is currently taking 7 units of NovoLog 3 times a day and 15 units of Lantus at bedtime daily for his diabetes. He states that he has not taken any insulin since Thursday morning due to feeling poorly. He began to gradually feel worse after stopping insulin, and developed nausea and vomiting late yesterday with accompanying generalized abdominal pain. Has vomited at least 10 times since yesterday. Has been feeling thirsty and had increased UOP.   States he has had to be hospitalized in the past several times for hyperglycemia.  Past Medical History  Diagnosis Date  . Diabetes mellitus   . Alcohol abuse     History reviewed. No pertinent past surgical history.  Family History  Problem Relation Age of Onset  . Diabetes Father   . Hypertension Father   . Alcohol abuse Father   . Diabetes Sister     History  Substance Use Topics  . Smoking status: Current Some Day Smoker    Types: Cigarettes  . Smokeless tobacco: Never Used  . Alcohol Use: Yes      Review of Systems  Constitutional: Positive for appetite change. Negative for fever and activity change.  HENT: Positive for rhinorrhea. Negative for sore throat and trouble swallowing.   Eyes: Negative.   Respiratory: Negative for chest tightness and shortness of breath.   Cardiovascular: Negative for chest pain and palpitations.  Gastrointestinal: Positive  for nausea, vomiting and abdominal pain. Negative for diarrhea.  Genitourinary: Negative for dysuria, decreased urine volume and difficulty urinating.  Musculoskeletal: Negative for myalgias.  Skin: Negative for color change and wound.  Neurological: Negative for dizziness and weakness.    Allergies  Review of patient's allergies indicates no known allergies.  Home Medications   Current Outpatient Rx  Name Route Sig Dispense Refill  . ACETAMINOPHEN 500 MG PO TABS Oral Take 1,000 mg by mouth every 6 (six) hours as needed. For pain    . INSULIN ASPART 100 UNIT/ML [place] SOLN Subcutaneous Inject 7 Units into the skin 3 (three) times daily before meals.    . INSULIN GLARGINE 100 UNIT/ML [place] SOLN Subcutaneous Inject 15 Units into the skin at bedtime.    Marland Kitchen LISINOPRIL 2.5 MG PO TABS Oral Take 2.5 mg by mouth every morning.    Marland Kitchen SIMVASTATIN 20 MG PO TABS Oral Take 20 mg by mouth at bedtime.    Letta Pate ULTRA SYSTEM W/DEVICE KIT Does not apply 1 kit by Does not apply route 3 (three) times daily.      Marland Kitchen LANCETS MISC Does not apply 1 each by Does not apply route 3 (three) times daily.        BP 110/57  Pulse 126  Temp(Src) 98.3 F (36.8 C) (Oral)  Resp 20  SpO2 97%  Physical Exam  Nursing note and vitals reviewed. Constitutional: He is oriented to person, place, and time. He appears well-developed and well-nourished. No distress.  Slightly somnolent, but arousable to speech and answering questions appropriately.  HENT:  Head: Normocephalic and atraumatic.  Eyes: Pupils are equal, round, and reactive to light.  Neck: Normal range of motion.  Cardiovascular: Regular rhythm and normal heart sounds.  Exam reveals no gallop and no friction rub.   No murmur heard.      tachycardic  Pulmonary/Chest: Effort normal and breath sounds normal. He has no wheezes. He exhibits no tenderness.  Abdominal: Soft. Bowel sounds are normal. There is no rebound and no guarding.       Generalized  tenderness to palpation, worst in the epigastrium  Neurological: He is oriented to person, place, and time. No cranial nerve deficit. GCS eye subscore is 3. GCS verbal subscore is 5. GCS motor subscore is 6.  Skin: Skin is warm and dry. No rash noted. He is not diaphoretic.    ED Course  Procedures (including critical care time)  CRITICAL CARE Performed by: Grant Fontana   Total critical care time: 30 min  Critical care time was exclusive of separately billable procedures and treating other patients.  Critical care was necessary to treat or prevent imminent or life-threatening deterioration.  Critical care was time spent personally by me on the following activities: development of treatment plan with patient and/or surrogate as well as nursing, discussions with consultants, evaluation of patient's response to treatment, examination of patient, obtaining history from patient or surrogate, ordering and performing treatments and interventions, ordering and review of laboratory studies, ordering and review of radiographic studies, pulse oximetry and re-evaluation of patient's condition.   Date: 02/05/2012  Rate: 123  Rhythm: sinus tachycardia  QRS Axis: normal  Intervals: normal  ST/T Wave abnormalities: normal  Conduction Disutrbances:none  Narrative Interpretation: borderline prolonged QTc  Old EKG Reviewed: compared with Aug 2012 - old with slight STE in anterolat leads, not present today  Labs Reviewed  GLUCOSE, CAPILLARY - Abnormal; Notable for the following:    Glucose-Capillary >600 (*)    All other components within normal limits  CBC - Abnormal; Notable for the following:    WBC 21.2 (*)    RBC 3.84 (*)    Hemoglobin 12.4 (*)    HCT 38.2 (*)    Platelets 404 (*)    All other components within normal limits  DIFFERENTIAL - Abnormal; Notable for the following:    Neutrophils Relative 86 (*)    Neutro Abs 18.2 (*)    Lymphocytes Relative 8 (*)    Monocytes  Absolute 1.3 (*)    All other components within normal limits  BASIC METABOLIC PANEL - Abnormal; Notable for the following:    Potassium 5.9 (*)    Chloride 84 (*)    CO2 8 (*)    Glucose, Bld 763 (*)    BUN 34 (*)    Creatinine, Ser 2.00 (*)    GFR calc non Af Amer 36 (*)    GFR calc Af Amer 42 (*)    All other components within normal limits  KETONES, QUALITATIVE - Abnormal; Notable for the following:    Acetone, Bld SMALL (*)    All other components within normal limits  GLUCOSE, CAPILLARY - Abnormal; Notable for the following:    Glucose-Capillary 589 (*)    All other components within normal limits  GLUCOSE, CAPILLARY - Abnormal; Notable for the following:    Glucose-Capillary 493 (*)    All other components within normal limits  URINALYSIS, ROUTINE W REFLEX MICROSCOPIC  BLOOD GAS, ARTERIAL  CARDIAC PANEL(CRET KIN+CKTOT+MB+TROPI)   No results found.   1. DKA (diabetic ketoacidoses)       MDM  5:09 PM Patient with blood sugar over 600 in the department. He is slightly somnolent, but able to answer questions. Plan to obtain basic blood work, aggressively rehydrate, and put on Glucomander protocol.  5:51 PM Basic metabolic panel shows hyperkalemia, hyperchloremia, a bicarbonate of 8, and glucose of 763. The anion gap is 44. DKA. Second line started with IVF bolus. Discussed with Dr. Estell Harpin. Plan to admit to [facility].      Grant Fontana, [place] 02/05/12 2100  Grant Fontana, [place] 02/05/12 2132

## 2012-02-05 NOTE — H&P (Signed)
PCP:   Kenneth Salisbury, MD, MD   Chief Complaint:  Nausea vomiting  HPI: Patient is a 56 year old Afro-American male past medical history of diabetes mellitus type 2 now on insulin for the past 7 years plus hypertension was actually been in relatively well controlled without any complications. In the past few weeks the patient has noted a mild cough but no other problems. In the last day, he started having complaints of abdominal cramping nausea and vomiting and diarrhea. Today when he could not stop throwing up, he came to the emergency room.  The emergency room patient was noted to be in acute renal failure with a creatinine of 2 was normal baseline is normal renal function. His blood sugars were in the 700s, his white blood cell count was 22 with 83% shift, his anion gap was markedly elevated at 43 and he showed signs of a significant DKA. His urinalysis and chest x-ray were unremarkable as were cardiac markers. Patient was started on IV fluids and IV insulin try hospitals were called for admission.  Review of Systems:  I saw the patient, he was doing okay. He complains of mild abdominal discomfort and mild nausea. Overall he said he felt much better than before. He denies any headaches, vision changes, dysphasia, chest pain, palpitations, shortness of breath or wheezing. He did note some coughing this past week but nothing productive. Again he does is a mild abdominal discomfort. No hematuria or dysuria or constipation. He was having some diarrhea yesterday but has not had any since. He denies any focal tenderness weakness or pain. He does feel quite fatigued. Review systems otherwise negative.  Past Medical History: Past Medical History  Diagnosis Date  . Diabetes mellitus   . Alcohol abuse    History reviewed. No pertinent past surgical history.  Medications: Prior to Admission medications   Medication Sig Start Date End Date Taking? Authorizing Provider  acetaminophen (TYLENOL) 500 MG  tablet Take 1,000 mg by mouth every 6 (six) hours as needed. For pain   Yes Historical Provider, MD  insulin aspart (NOVOLOG) 100 UNIT/ML injection Inject 7 Units into the skin 3 (three) times daily before meals. 10/28/11  Yes Kenneth Salisbury, MD  insulin glargine (LANTUS) 100 UNIT/ML injection Inject 15 Units into the skin at bedtime. 10/28/11  Yes Kenneth Salisbury, MD  lisinopril (PRINIVIL,ZESTRIL) 2.5 MG tablet Take 2.5 mg by mouth every morning. 10/28/11  Yes Kenneth Salisbury, MD  simvastatin (ZOCOR) 20 MG tablet Take 20 mg by mouth at bedtime. 10/28/11  Yes Kenneth Salisbury, MD  Blood Glucose Monitoring Suppl (ONE TOUCH ULTRA SYSTEM KIT) W/DEVICE KIT 1 kit by Does not apply route 3 (three) times daily.      Historical Provider, MD  Lancets MISC 1 each by Does not apply route 3 (three) times daily.      Historical Provider, MD    Allergies:  No Known Allergies  Social History:  reports that he has been smoking Cigarettes.  He has never used smokeless tobacco. He reports that he drinks alcohol. He reports that he does not use illicit drugs. The patient is normally at baseline able to participate in full activities of daily living without complaint. He lives at home with his wife..  Family History: Family History  Problem Relation Age of Onset  . Diabetes Father   . Hypertension Father   . Alcohol abuse Father   . Diabetes Sister     Physical Exam: Filed Vitals:   02/05/12  1800 02/05/12 1815 02/05/12 1901 02/05/12 2031  BP: 134/76  135/72 135/72  Pulse: 120 121 120 126  Temp:      TempSrc:      Resp: 21 20 20    SpO2: 98% 98% 97% 99%   HEENT: Normocephalic, atraumatic, mucous her meds are dry Cardiovascular: Regular rhythm, tachycardic Lungs:" Bilaterally Abdomen: Soft, generalized nonspecific tenderness, minimal distention, hypoactive bowel sounds Extremity, clubbing or cyanosis, trace pitting edema   Labs on Admission:   [facility] 02/05/12 1649  NA 136  K 5.9*  CL 84*  CO2 8*    GLUCOSE 763*  BUN 34*  CREATININE 2.00*  CALCIUM 9.8  MG --  PHOS --    Basename 02/05/12 1649  WBC 21.2*  NEUTROABS 18.2*  HGB 12.4*  HCT 38.2*  MCV 99.5  PLT 404*    Basename 02/05/12 1903  CKTOTAL 147  CKMB 3.3  CKMBINDEX --  TROPONINI <0.30    Radiological Exams on Admission: Dg Chest Portable 1 View 02/05/2012    IMPRESSION:  1. No acute cardiopulmonary disease. 2.  Increased density projecting over the medial right lung base is similar over numerous prior exams and likely an area of scarring.  Original Report Authenticated By: Consuello Bossier, M.D.    Assessment/Plan Present on Admission:  .HYPERTENSION: Holding antihypertensives given his volume depletion with DKA.  Marland KitchenHyperlipidemia: Stable. Holding his statin.  Marland KitchenDIABETES MELLITUS, TYPE II: Normally well controlled. His A1c in November was 6.9. Unclear what had caused this DKA episode. We'll continue to investigate.  .ARF (acute renal failure): Secondary DKA. Baseline is normal renal function. Continue to follow labs.  .DKA, type 2: Principal problem. On DKA protocol. Continue aggressive hydration plus IV insulin and serial basic metabolic panels.  Leukocytosis: In part from stress margination from DKA, although some may be an underlying infection which may have caused this to begin with. We'll continue to follow.  .Hyperkalemia: This likely reverse itself as his CBGs come down with IV insulin.  .Tobacco abuse: Given nicotine patch  After discussion with the patient, he is to be a full code.  We will respect these wishes.  I anticipate his length of stay to be 2-3 days based on history, physical exam and lab work.  Time spent on this patient including examination and decision-making process: 55 minutes.  Hollice Espy [phone] 02/05/2012, 8:52 PM

## 2012-02-05 NOTE — ED Provider Notes (Signed)
Medical screening examination/treatment/procedure(s) were performed by non-physician practitioner and as supervising physician I was immediately available for consultation/collaboration.   Benny Lennert, MD 02/05/12 [phone]

## 2012-02-05 NOTE — ED Notes (Signed)
Per spouse, pt has been sick w/chest sinus congestion x2 wks, pt has been taking OTC decongestant and sugar free cough drops, pt reports he began feeling weaker on Thursday, began vomiting last night, vomit x10 in last 2 days, increase urine output and thirst, last took his insulin on Thursday. Pt sleepy, easily aroused w/verbal stimuli

## 2012-02-05 NOTE — ED Notes (Signed)
Pt to ED c/o hyperglycemia.  St's he has not taken his insulin since [place]. Because he felt bad.  CBG in triage >600

## 2012-02-05 NOTE — ED Notes (Signed)
Patient denies pain and is resting comfortably.  

## 2012-02-05 NOTE — ED Provider Notes (Signed)
Pt  Has weakness and cough.  pe lungs clear.  Heart tachycardic.  Dx dka.  Admit  Medical screening examination/treatment/procedure(s) were conducted as a shared visit with non-physician practitioner(s) and myself.  I personally evaluated the patient during the encounter   Benny Lennert, MD 02/05/12 1810

## 2012-02-05 NOTE — ED Notes (Signed)
Pt knows that urine is needed 

## 2012-02-06 LAB — CBC
Hemoglobin: 10.9 g/dL — ABNORMAL LOW (ref 13.0–17.0)
MCHC: 34.1 g/dL (ref 30.0–36.0)
RBC: 3.43 MIL/uL — ABNORMAL LOW (ref 4.22–5.81)

## 2012-02-06 LAB — GLUCOSE, CAPILLARY
Glucose-Capillary: 139 mg/dL — ABNORMAL HIGH (ref 70–99)
Glucose-Capillary: 142 mg/dL — ABNORMAL HIGH (ref 70–99)
Glucose-Capillary: 156 mg/dL — ABNORMAL HIGH (ref 70–99)
Glucose-Capillary: 156 mg/dL — ABNORMAL HIGH (ref 70–99)
Glucose-Capillary: 181 mg/dL — ABNORMAL HIGH (ref 70–99)
Glucose-Capillary: 181 mg/dL — ABNORMAL HIGH (ref 70–99)
Glucose-Capillary: 191 mg/dL — ABNORMAL HIGH (ref 70–99)
Glucose-Capillary: 222 mg/dL — ABNORMAL HIGH (ref 70–99)
Glucose-Capillary: 253 mg/dL — ABNORMAL HIGH (ref 70–99)

## 2012-02-06 LAB — BASIC METABOLIC PANEL
BUN: 21 mg/dL (ref 6–23)
BUN: 13 mg/dL (ref 6–23)
CO2: 18 mEq/L — ABNORMAL LOW (ref 19–32)
CO2: 20 mEq/L (ref 19–32)
CO2: 21 mEq/L (ref 19–32)
CO2: 25 mEq/L (ref 19–32)
Calcium: 9.2 mg/dL (ref 8.4–10.5)
Calcium: 8.8 mg/dL (ref 8.4–10.5)
Chloride: 113 mEq/L — ABNORMAL HIGH (ref 96–112)
Creatinine, Ser: 1.09 mg/dL (ref 0.50–1.35)
GFR calc non Af Amer: >90 mL/min (ref >90)
Glucose, Bld: 230 mg/dL — ABNORMAL HIGH (ref 70–99)
Glucose, Bld: 187 mg/dL — ABNORMAL HIGH (ref 70–99)
Glucose, Bld: 170 mg/dL — ABNORMAL HIGH (ref 70–99)
Glucose, Bld: 172 mg/dL — ABNORMAL HIGH (ref 70–99)
Potassium: 3 mEq/L — ABNORMAL LOW (ref 3.5–5.1)
Sodium: 150 mEq/L — ABNORMAL HIGH (ref 135–145)
Sodium: 151 mEq/L — ABNORMAL HIGH (ref 135–145)

## 2012-02-06 MED ORDER — POTASSIUM CHLORIDE CRYS ER 20 MEQ PO TBCR
40.0000 meq | EXTENDED_RELEASE_TABLET | Freq: Once | ORAL | Status: AC
  Administered 2012-02-06: 40 meq via ORAL
  Filled 2012-02-06: qty 2

## 2012-02-06 MED ORDER — DM-GUAIFENESIN ER 30-600 MG PO TB12
1.0000 | ORAL_TABLET | Freq: Two times a day (BID) | ORAL | Status: DC
  Administered 2012-02-06 – 2012-02-08 (×5): 1 via ORAL
  Filled 2012-02-06 (×7): qty 1

## 2012-02-06 MED ORDER — INSULIN GLARGINE 100 UNIT/ML ~~LOC~~ SOLN
15.0000 [IU] | SUBCUTANEOUS | Status: AC
  Administered 2012-02-06: 15 [IU] via SUBCUTANEOUS

## 2012-02-06 MED ORDER — INSULIN ASPART 100 UNIT/ML ~~LOC~~ SOLN
7.0000 [IU] | Freq: Three times a day (TID) | SUBCUTANEOUS | Status: DC
  Administered 2012-02-07 (×2): 7 [IU] via SUBCUTANEOUS
  Filled 2012-02-06: qty 3

## 2012-02-06 MED ORDER — SIMVASTATIN 20 MG PO TABS
20.0000 mg | ORAL_TABLET | Freq: Every day | ORAL | Status: DC
  Administered 2012-02-06 – 2012-02-07 (×2): 20 mg via ORAL
  Filled 2012-02-06 (×3): qty 1

## 2012-02-06 MED ORDER — SODIUM CHLORIDE 0.9 % IV SOLN
INTRAVENOUS | Status: DC
  Administered 2012-02-06: 1000 mL via INTRAVENOUS

## 2012-02-06 MED ORDER — INSULIN GLARGINE 100 UNIT/ML ~~LOC~~ SOLN
15.0000 [IU] | Freq: Every day | SUBCUTANEOUS | Status: DC
  Filled 2012-02-06: qty 3

## 2012-02-06 MED ORDER — INSULIN GLARGINE 100 UNIT/ML ~~LOC~~ SOLN
15.0000 [IU] | Freq: Every day | SUBCUTANEOUS | Status: DC
  Administered 2012-02-06: 15 [IU] via SUBCUTANEOUS

## 2012-02-06 MED ORDER — LISINOPRIL 2.5 MG PO TABS: 2.5000 mg | ORAL_TABLET | Freq: Every morning | ORAL | Status: DC

## 2012-02-06 NOTE — Progress Notes (Addendum)
Subjective: He ran out of his Lantus 2 wks ago and has been taking Novolog with breakfast and lunch only. He developed a cough w/ yellow sputum about 1 wk ago. He noted his sugars going up about 2-3 days ago after he began a cough syrup.   Objective: Blood pressure 106/64, pulse 90, temperature 97.7 F (36.5 C), temperature source Oral, resp. rate 22, height 5\' 9"  (1.753 m), weight 83.462 kg (184 lb), SpO2 98.00%. Weight change:   Intake/Output Summary (Last 24 hours) at 02/06/12 1532 Last data filed at 02/06/12 1200  Gross per 24 hour  Intake   3440 ml  Output   2425 ml  Net   1015 ml    Physical Exam: General appearance: alert and cooperative Lungs: clear to auscultation bilaterally Heart: regular rate and rhythm, S1, S2 normal, no murmur, click, rub or gallop Abdomen: soft, non-tender; bowel sounds normal; no masses,  no organomegaly Skin: Skin color, texture, turgor normal. No rashes or lesions  Lab Results:  Basename 02/06/12 0926 02/06/12 0245  NA 151* 150*  K 4.1 4.1  CL 116* 114*  CO2 21 20  GLUCOSE 170* 187*  BUN 18 21  CREATININE 0.80 1.01  CALCIUM 8.8 9.2  MG -- --  PHOS -- --   No results found for this basename: AST:2,ALT:2,ALKPHOS:2,BILITOT:2,PROT:2,ALBUMIN:2 in the last 72 hours No results found for this basename: LIPASE:2,AMYLASE:2 in the last 72 hours  Basename 02/06/12 0245 02/05/12 1649  WBC 15.2* 21.2*  NEUTROABS -- 18.2*  HGB 10.9* 12.4*  HCT 32.0* 38.2*  MCV 93.3 99.5  PLT 311 404*    Basename 02/05/12 1903  CKTOTAL 147  CKMB 3.3  CKMBINDEX --  TROPONINI <0.30   No components found with this basename: POCBNP:3 No results found for this basename: DDIMER:2 in the last 72 hours No results found for this basename: HGBA1C:2 in the last 72 hours No results found for this basename: CHOL:2,HDL:2,LDLCALC:2,TRIG:2,CHOLHDL:2,LDLDIRECT:2 in the last 72 hours No results found for this basename: TSH,T4TOTAL,FREET3,T3FREE,THYROIDAB in the last 72  hours No results found for this basename: VITAMINB12:2,FOLATE:2,FERRITIN:2,TIBC:2,IRON:2,RETICCTPCT:2 in the last 72 hours  Micro Results: Recent Results (from the past 240 hour(s))  MRSA PCR SCREENING     Status: Normal   Collection Time   02/05/12 10:27 PM      Component Value Range Status Comment   MRSA by PCR NEGATIVE  NEGATIVE  Final     Studies/Results: Dg Chest Portable 1 View  02/05/2012  *RADIOLOGY REPORT*  Clinical Data: Hyperglycemia and weakness.  PORTABLE CHEST - 1 VIEW  Comparison: 08/04/2011.  Findings: Midline trachea.  Normal heart size.  Costophrenic angles are minimally excluded. Given this factor, no pleural effusion or pneumothorax.  Lung apices are clear.  An area of increased density projecting over the medial right lung base is similar over prior exams, including back to 09/27/2006.  Lower lobe predominant interstitial thickening may be related to smoking or chronic bronchitis.  IMPRESSION:  1. No acute cardiopulmonary disease. 2.  Increased density projecting over the medial right lung base is similar over numerous prior exams and likely an area of scarring.  Original Report Authenticated By: Consuello Bossier, M.D.    Medications: Scheduled Meds:   . dextromethorphan-guaiFENesin  1 tablet Oral BID  . enoxaparin  40 mg Subcutaneous Q24H  . nicotine  21 mg Transdermal Daily  . pantoprazole (PROTONIX) IV  40 mg Intravenous Q24H  . sodium chloride  1,000 mL Intravenous Once  . sodium chloride  1,000  mL Intravenous Once  . sodium chloride  1,000 mL Intravenous Once  . sodium chloride  1,000 mL Intravenous Once  . DISCONTD: insulin regular  5 Units Intravenous TID WC   Continuous Infusions:   . dextrose 5 % and 0.45% NaCl    . dextrose 5 % and 0.45% NaCl 125 mL/hr at 02/06/12 1200  . insulin (NOVOLIN-R) infusion 10.8 Units/hr (02/05/12 2043)  . insulin (NOVOLIN-R) infusion 4.1 Units/hr (02/06/12 0650)  . DISCONTD: sodium chloride    . DISCONTD: sodium chloride 125  mL/hr at 02/06/12 0300   PRN Meds:.dextrose, dextrose, HYDROmorphone (DILAUDID) injection, morphine injection, ondansetron (ZOFRAN) IV, ondansetron (ZOFRAN) IV  Assessment/Plan: Principal Problem:  *DKA, type 2- will switch off of insulin drip once gap has closed. Will resume home dose of Lantus and place him on TID novolog.   Active Problems:  Acute Bronchitis  Viral vs bacterial. If WBC count doesn't improve, may need to start a Quinolone tomorrow. He may have had the flu but as this has been going on for about 1 wk and he is improving now, he is not a candidate for tamiflu.    HYPERTENSION- cont to hold lisinopril due to hypotension  Hypernatremia- if sodium continues to increase on d51/2ns, then will switch to sterile water.    ARF (acute renal failure)- resolved  Hyperkalemia- resolved  Tobacco abuse  Hyperlipidemia   LOS: 1 day   [facility] [phone] 02/06/2012, 3:32 PM

## 2012-02-07 LAB — GLUCOSE, CAPILLARY
Glucose-Capillary: 382 mg/dL — ABNORMAL HIGH (ref 70–99)
Glucose-Capillary: 365 mg/dL — ABNORMAL HIGH (ref 70–99)
Glucose-Capillary: 197 mg/dL — ABNORMAL HIGH (ref 70–99)
Glucose-Capillary: 274 mg/dL — ABNORMAL HIGH (ref 70–99)
Glucose-Capillary: 303 mg/dL — ABNORMAL HIGH (ref 70–99)

## 2012-02-07 LAB — BASIC METABOLIC PANEL
BUN: 9 mg/dL (ref 6–23)
CO2: 25 mEq/L (ref 19–32)
Calcium: 8.5 mg/dL (ref 8.4–10.5)
Creatinine, Ser: 0.71 mg/dL (ref 0.50–1.35)

## 2012-02-07 MED ORDER — ASPIRIN 81 MG PO CHEW
81.0000 mg | CHEWABLE_TABLET | Freq: Every day | ORAL | Status: DC
  Administered 2012-02-07 – 2012-02-08 (×2): 81 mg via ORAL
  Filled 2012-02-07 (×2): qty 1

## 2012-02-07 MED ORDER — INSULIN ASPART 100 UNIT/ML ~~LOC~~ SOLN
0.0000 [IU] | Freq: Every day | SUBCUTANEOUS | Status: DC
  Administered 2012-02-07: 4 [IU] via SUBCUTANEOUS

## 2012-02-07 MED ORDER — POTASSIUM CHLORIDE CRYS ER 20 MEQ PO TBCR
40.0000 meq | EXTENDED_RELEASE_TABLET | Freq: Once | ORAL | Status: AC
  Administered 2012-02-07: 40 meq via ORAL
  Filled 2012-02-07: qty 2

## 2012-02-07 MED ORDER — INSULIN GLARGINE 100 UNIT/ML ~~LOC~~ SOLN
20.0000 [IU] | Freq: Every day | SUBCUTANEOUS | Status: DC
  Administered 2012-02-07: 20 [IU] via SUBCUTANEOUS

## 2012-02-07 MED ORDER — INSULIN ASPART 100 UNIT/ML ~~LOC~~ SOLN
0.0000 [IU] | Freq: Three times a day (TID) | SUBCUTANEOUS | Status: DC
  Administered 2012-02-07: 15 [IU] via SUBCUTANEOUS
  Administered 2012-02-08: 7 [IU] via SUBCUTANEOUS
  Administered 2012-02-08: 11 [IU] via SUBCUTANEOUS
  Administered 2012-02-08: 7 [IU] via SUBCUTANEOUS

## 2012-02-07 MED ORDER — INSULIN ASPART 100 UNIT/ML ~~LOC~~ SOLN
8.0000 [IU] | Freq: Three times a day (TID) | SUBCUTANEOUS | Status: DC
  Administered 2012-02-07 – 2012-02-08 (×4): 8 [IU] via SUBCUTANEOUS

## 2012-02-07 MED ORDER — LISINOPRIL 2.5 MG PO TABS
2.5000 mg | ORAL_TABLET | Freq: Every morning | ORAL | Status: DC
  Administered 2012-02-08: 2.5 mg via ORAL
  Filled 2012-02-07: qty 1

## 2012-02-07 MED ORDER — OXYCODONE HCL 5 MG PO TABS: 5.0000 mg | ORAL_TABLET | ORAL | Status: DC | PRN

## 2012-02-07 NOTE — Progress Notes (Signed)
[facility] [place] TEAM 8  Subjective: 56 year old male past medical history of diabetes mellitus type 2 now on insulin for the past 7 years started having complaints of abdominal cramping nausea and vomiting and diarrhea.  Resting comfortably at present.  Denies f/c, sob, n/v, or abdom pain.    Objective: Weight change:   Intake/Output Summary (Last 24 hours) at 02/07/12 1546 Last data filed at 02/07/12 1200  Gross per 24 hour  Intake    940 ml  Output   3000 ml  Net  -2060 ml   Blood pressure 119/79, pulse 85, temperature 97.8 F (36.6 C), temperature source Oral, resp. rate 18, height 5\' 9"  (1.753 m), weight 83.462 kg (184 lb), SpO2 96.00%.  Physical Exam: General: No acute respiratory distress Lungs: Clear to auscultation bilaterally without wheezes or crackles Cardiovascular: Regular rate and rhythm without murmur gallop or rub normal S1 and S2 Abdomen: Nontender, nondistended, soft, bowel sounds positive, no rebound, no ascites, no appreciable mass Extremities: No significant cyanosis, clubbing, or edema bilateral lower extremities  Lab Results:  Basename 02/07/12 0505 02/06/12 1555 02/06/12 0926  NA 139 143 151*  K 3.4* 3.0* 4.1  CL 106 109 116*  CO2 25 25 21   GLUCOSE 203* 172* 170*  BUN 9 13 18   CREATININE 0.71 0.72 0.80  CALCIUM 8.5 8.6 8.8  MG -- -- --  PHOS -- -- --    Basename 02/06/12 0245 02/05/12 1649  WBC 15.2* 21.2*  NEUTROABS -- 18.2*  HGB 10.9* 12.4*  HCT 32.0* 38.2*  MCV 93.3 99.5  PLT 311 404*    Basename 02/05/12 1903  CKTOTAL 147  CKMB 3.3  CKMBINDEX --  TROPONINI <0.30   Micro Results: Recent Results (from the past 240 hour(s))  MRSA PCR SCREENING     Status: Normal   Collection Time   02/05/12 10:27 PM      Component Value Range Status Comment   MRSA by PCR NEGATIVE  NEGATIVE  Final     Studies/Results: All recent x-ray/radiology reports have been reviewed in detail.   Medications: I have reviewed the patient's  complete medication list.  Assessment/Plan:  Acute renal failure  Resolved  Uncontrolled DM/DKA Ran out of his lantus 2 weeks ago - gap now closed - off insulin gtt - CBG still not at goal/is erratic - adjust tx plan and follow CBG - is on an ACE and asa as well   Acute bronchitis Sx have all but resolved - do not feel abx will be necessary  hypernatremia Resolved  EtOH abuse Advised to abstain  HTN Well controlled at present - follow trend   Hyperlipidemia On medical tx  Leukocytosis Likely due to DKA - CBC was not ordered for today - will recheck in AM  Tobacco abuse Advised to abstain  Mild hypokalemia Replace and recheck in AM  Dispo Stable for transfer to floor - d/c home as soon as CBG better controlled  Lonia Blood, MD [facility] Office  [phone] Pager [phone]  On-Call/Text Page:      [email or web address]      password [email or web address]

## 2012-02-07 NOTE — Progress Notes (Addendum)
Inpatient Diabetes Program Recommendations  AACE/ADA: New Consensus Statement on Inpatient Glycemic Control (2009)  Target Ranges:  Prepandial:   less than 140 mg/dL      Peak postprandial:   less than 180 mg/dL (1-2 hours)      Critically ill patients:  140 - 180 mg/dL   Results for Kenneth [name], Kenneth [name] (MRN [id number]) as of 02/07/2012 09:16  Ref. Range 02/07/2012 08:12  Glucose-Capillary Latest Range: 70-99 mg/dL 409 (H)    Inpatient Diabetes Program Recommendations Correction (SSI): Please start Novolog Moderate correction scale (SSI) tid ac + HS.  Spoke with patient about why he ran out of insulin.  Pt told me he gets his Novolog and Lantus at a local pharmacy and could not get to the pharmacy before it closed after work.  Reminded pt of the importance of taking insulin to control CBGs and prevent long-term and acute complications.  Pt stated that he checks his CBGs bid at home.  Needs to make a follow-up appt with his primary MD (Dr. Clent Ridges) in about 1 month.  Last A1C was 6.3% (10/28/11).    Note: will follow. Ambrose Finland RN, MSN, CDE Diabetes Coordinator Inpatient Diabetes Program [phone]

## 2012-02-07 NOTE — Progress Notes (Signed)
Utilization Review Completed.Kenneth [name] T2/18/2013   

## 2012-02-07 NOTE — Progress Notes (Signed)
Pt. Being transferred to [id number] via wheelchair. Pt. As well as his girlfriend are aware of the new room. Phone report called to Tammy,RN.

## 2012-02-08 LAB — CBC
Hemoglobin: 9.2 g/dL — ABNORMAL LOW (ref 13.0–17.0)
MCH: 32.2 pg (ref 26.0–34.0)
MCV: 93 fL (ref 78.0–100.0)
RBC: 2.86 MIL/uL — ABNORMAL LOW (ref 4.22–5.81)

## 2012-02-08 LAB — BASIC METABOLIC PANEL
CO2: 30 mEq/L (ref 19–32)
GFR calc non Af Amer: >90 mL/min (ref >90)
Glucose, Bld: 219 mg/dL — ABNORMAL HIGH (ref 70–99)
Potassium: 3.3 mEq/L — ABNORMAL LOW (ref 3.5–5.1)
Sodium: 139 mEq/L (ref 135–145)

## 2012-02-08 LAB — GLUCOSE, CAPILLARY
Glucose-Capillary: 212 mg/dL — ABNORMAL HIGH (ref 70–99)
Glucose-Capillary: 261 mg/dL — ABNORMAL HIGH (ref 70–99)
Glucose-Capillary: 227 mg/dL — ABNORMAL HIGH (ref 70–99)

## 2012-02-08 MED ORDER — INSULIN GLARGINE 100 UNIT/ML ~~LOC~~ SOLN: 25.0000 [IU] | Freq: Every day | SUBCUTANEOUS | Status: DC

## 2012-02-08 MED ORDER — INSULIN ASPART 100 UNIT/ML ~~LOC~~ SOLN: 10.0000 [IU] | Freq: Three times a day (TID) | SUBCUTANEOUS | Status: DC

## 2012-02-09 NOTE — Discharge Summary (Signed)
DISCHARGE SUMMARY  Kenneth [name]  MR#: [id number]  DOB:1956-04-25  Date of Admission: 02/05/2012 Date of Discharge: 02/09/2012  Attending Physician:Kenneth [name]  Patient's PCP:Kenneth A, MD, MD  Consults: none  Presenting Complaint: Nausea and vomiting  Discharge Diagnoses: Principal Problem:  *DKA, type 2 Active Problems: Acute Viral bronchitis ARF due to dehydration  HYPERTENSION  ARF (acute renal failure)  DIABETES MELLITUS, TYPE II  Hyperlipidemia  Hyperkalemia  Tobacco abuse    Discharge Medications: Medication List  As of 02/09/2012  9:55 PM   TAKE these medications         acetaminophen 500 MG tablet   Commonly known as: TYLENOL   Take 1,000 mg by mouth every 6 (six) hours as needed. For pain      insulin aspart 100 UNIT/ML injection   Commonly known as: novoLOG   Inject 10 Units into the skin 3 (three) times daily before meals.      insulin glargine 100 UNIT/ML injection   Commonly known as: LANTUS   Inject 25 Units into the skin at bedtime.      Lancets Misc   1 each by Does not apply route 3 (three) times daily.      lisinopril 2.5 MG tablet   Commonly known as: PRINIVIL,ZESTRIL   Take 2.5 mg by mouth every morning.      ONE TOUCH ULTRA SYSTEM KIT W/DEVICE Kit   1 kit by Does not apply route 3 (three) times daily.      simvastatin 20 MG tablet   Commonly known as: ZOCOR   Take 20 mg by mouth at bedtime.             Procedures: Dg Chest Portable 1 View  02/05/2012  *RADIOLOGY REPORT*  Clinical Data: Hyperglycemia and weakness.  PORTABLE CHEST - 1 VIEW  Comparison: 08/04/2011.  Findings: Midline trachea.  Normal heart size.  Costophrenic angles are minimally excluded. Given this factor, no pleural effusion or pneumothorax.  Lung apices are clear.  An area of increased density projecting over the medial right lung base is similar over prior exams, including back to 09/27/2006.  Lower lobe predominant interstitial thickening may be  related to smoking or chronic bronchitis.  IMPRESSION:  1. No acute cardiopulmonary disease. 2.  Increased density projecting over the medial right lung base is similar over numerous prior exams and likely an area of scarring.  Original Report Authenticated By: Consuello Bossier, M.D.    echocardiogram  Hospital Course This is [name] 56 y/o insluin dependant diabetic who ran out of his Lantus about 2-3 weeks prior to admission. About [name] week prior to admission he developed [name] cold and [name] congested cough. He noticed that his sugars began to creep up until they did not register on his glucometer. He developed vomiting and then presented to the ER and was noted to be in DKA.   His metabolic disturbance was corrected with an Insulin drip, IVF and potassium replacement. His vomiting resolved and he is now tolerating [name] regular diet. His sugars have been high and we have had to titrate his Insulin up. He is now running in the high 100s-low 200s. He is advised to ensure he follows up with his doctor in 1 week. He states that he has bought his Lantus and will be using it as ordered.    Day of Discharge Physical Exam: BP 121/69  Pulse 99  Temp(Src) 99 F (37.2 C) (Oral)  Resp 18  Ht 5\' 9"  (1.753 m)  Wt 83.462 kg (184 lb)  BMI 27.17 kg/m2  SpO2 99% General: No acute respiratory distress  Lungs: Clear to auscultation bilaterally without wheezes or crackles  Cardiovascular: Regular rate and rhythm without murmur gallop or rub normal S1 and S2  Abdomen: Nontender, nondistended, soft, bowel sounds positive, no rebound, no ascites, no appreciable mass  Extremities: No significant cyanosis, clubbing, or edema bilateral lower extremities   No results found for this or any previous visit (from the past 24 hour(s)).  Disposition: stable for d/c   Follow-up Appts: Discharge Orders    Future Orders Please Complete By Expires   Diet - low sodium heart healthy      Comments:   diabetic diet   Increase activity  slowly         Follow-up with PCP in 1-2 weeks.   Time on Discharge: >45 min  Signed: [name] 02/09/2012, 9:55 PM

## 2012-02-14 ENCOUNTER — Encounter: Payer: Self-pay | Admitting: Family Medicine

## 2012-02-14 ENCOUNTER — Ambulatory Visit (INDEPENDENT_AMBULATORY_CARE_PROVIDER_SITE_OTHER): Payer: BC Managed Care – PPO | Admitting: Family Medicine

## 2012-02-14 VITALS — BP 120/70 | HR 82 | Temp 98.9°F | Wt 203.0 lb

## 2012-02-14 DIAGNOSIS — E1110 Type 2 diabetes mellitus with ketoacidosis without coma: Secondary | ICD-10-CM

## 2012-02-14 DIAGNOSIS — E119 Type 2 diabetes mellitus without complications: Secondary | ICD-10-CM

## 2012-02-14 MED ORDER — INSULIN ASPART 100 UNIT/ML ~~LOC~~ SOLN: 10.0000 [IU] | Freq: Three times a day (TID) | SUBCUTANEOUS | Status: DC

## 2012-02-14 MED ORDER — INSULIN GLARGINE 100 UNIT/ML ~~LOC~~ SOLN: 25.0000 [IU] | Freq: Every day | SUBCUTANEOUS | Status: DC

## 2012-02-14 NOTE — Progress Notes (Signed)
  Subjective:    Patient ID: Kenneth [name], male    DOB: Oct 17, 1956, 56 y.o.   MRN: [id number]  HPI Here to follow up a hospital stay from 02-05-12 to 02-09-12 for DKA. His diabetes was under excellent control for awhile, as evidenced by his A1c of 6.3 on 11-01-11. Then he ran out of Lantus for 3 weeks and developed a viral URI. He quickly became dehydrated, and wound up in the ER. He has done well since he got home, and he is back to work. His glucoses run in the high 100s to middle 200s. He has stopped smoking.    Review of Systems  Constitutional: Negative.   Respiratory: Negative.   Cardiovascular: Negative.        Objective:   Physical Exam  Constitutional: He appears well-developed and well-nourished.  Cardiovascular: Normal rate, regular rhythm, normal heart sounds and intact distal pulses.   Pulmonary/Chest: Effort normal and breath sounds normal.          Assessment & Plan:  His URI has resolved. His diabetes is getting back under control. We will see him back for an A1c in 4 weeks

## 2012-02-28 ENCOUNTER — Telehealth: Payer: Self-pay | Admitting: Family Medicine

## 2012-02-28 NOTE — Telephone Encounter (Signed)
Pt is experiencing a cough and would like to know what he could take to help suppress the cough. Pt has been taking cough drops but are not helping.

## 2012-02-28 NOTE — Telephone Encounter (Signed)
Pt has non productive cough with no fever, and it is mostly at night.  Would like RX called in to pharmacy.  Does not feel ill.

## 2012-02-28 NOTE — Telephone Encounter (Signed)
Use Mucinex bid and add Delsym prn cough

## 2012-02-29 NOTE — Telephone Encounter (Signed)
Notified pt. 

## 2012-03-16 ENCOUNTER — Ambulatory Visit (INDEPENDENT_AMBULATORY_CARE_PROVIDER_SITE_OTHER): Payer: BC Managed Care – PPO | Admitting: Family Medicine

## 2012-03-16 ENCOUNTER — Encounter: Payer: Self-pay | Admitting: Family Medicine

## 2012-03-16 VITALS — BP 120/80 | HR 81 | Temp 98.5°F | Wt 210.0 lb

## 2012-03-16 DIAGNOSIS — I10 Essential (primary) hypertension: Secondary | ICD-10-CM

## 2012-03-16 DIAGNOSIS — E119 Type 2 diabetes mellitus without complications: Secondary | ICD-10-CM

## 2012-03-16 LAB — BASIC METABOLIC PANEL
CO2: 27 mEq/L (ref 19–32)
Calcium: 9.4 mg/dL (ref 8.4–10.5)
Glucose, Bld: 119 mg/dL — ABNORMAL HIGH (ref 70–99)
Sodium: 138 mEq/L (ref 135–145)

## 2012-03-16 NOTE — Progress Notes (Signed)
  Subjective:    Patient ID: Kenneth [name], male    DOB: 21-Jan-1956, 56 y.o.   MRN: [id number]  HPI Here to follow up on diabetes. He is following his diet closely and taking his meds as prescribed. He feels great.    Review of Systems  Constitutional: Negative.   Respiratory: Negative.   Cardiovascular: Negative.        Objective:   Physical Exam  Constitutional: He appears well-developed and well-nourished.  Cardiovascular: Normal rate, regular rhythm, normal heart sounds and intact distal pulses.   Pulmonary/Chest: Effort normal and breath sounds normal.          Assessment & Plan:  He seems to be doing well. We will check an A1c today

## 2012-03-28 ENCOUNTER — Other Ambulatory Visit: Payer: Self-pay | Admitting: Family Medicine

## 2012-03-28 NOTE — Progress Notes (Signed)
Quick Note:  Pt informed, labs mailed to his home with instructions highlighted. Future orders done ______

## 2012-04-25 ENCOUNTER — Telehealth: Payer: Self-pay | Admitting: Family Medicine

## 2012-04-25 NOTE — Telephone Encounter (Signed)
Call in 100 mg tabs to use prn, #5 with 11 rf

## 2012-04-25 NOTE — Telephone Encounter (Signed)
Pt left a voice message and is requesting Viagra.

## 2012-04-26 MED ORDER — SILDENAFIL CITRATE 100 MG PO TABS: 50.0000 mg | ORAL_TABLET | Freq: Every day | ORAL | Status: DC | PRN

## 2012-04-26 NOTE — Telephone Encounter (Signed)
I sent in script and spoke with pt. 

## 2012-06-20 ENCOUNTER — Telehealth: Payer: Self-pay | Admitting: Family Medicine

## 2012-06-20 NOTE — Telephone Encounter (Signed)
Faxed to 2 pages to Dr. Gershon Crane for review on 06-20-12 ym

## 2012-09-12 ENCOUNTER — Other Ambulatory Visit: Payer: Self-pay | Admitting: Family Medicine

## 2012-09-18 ENCOUNTER — Ambulatory Visit (INDEPENDENT_AMBULATORY_CARE_PROVIDER_SITE_OTHER): Payer: BC Managed Care – PPO | Admitting: Family Medicine

## 2012-09-18 ENCOUNTER — Encounter: Payer: Self-pay | Admitting: Family Medicine

## 2012-09-18 VITALS — BP 122/80 | HR 86 | Temp 98.8°F | Wt 218.0 lb

## 2012-09-18 DIAGNOSIS — E119 Type 2 diabetes mellitus without complications: Secondary | ICD-10-CM

## 2012-09-18 DIAGNOSIS — I10 Essential (primary) hypertension: Secondary | ICD-10-CM

## 2012-09-18 DIAGNOSIS — E785 Hyperlipidemia, unspecified: Secondary | ICD-10-CM

## 2012-09-18 DIAGNOSIS — Z23 Encounter for immunization: Secondary | ICD-10-CM

## 2012-09-18 DIAGNOSIS — R05 Cough: Secondary | ICD-10-CM

## 2012-09-18 DIAGNOSIS — R059 Cough, unspecified: Secondary | ICD-10-CM

## 2012-09-18 LAB — CBC WITH DIFFERENTIAL/PLATELET
Basophils Absolute: 0 10*3/uL (ref 0.0–0.1)
Basophils Relative: 0.3 % (ref 0.0–3.0)
Eosinophils Absolute: 0 10*3/uL (ref 0.0–0.7)
HCT: 37 % — ABNORMAL LOW (ref 39.0–52.0)
Hemoglobin: 12.3 g/dL — ABNORMAL LOW (ref 13.0–17.0)
Lymphocytes Relative: 36.1 % (ref 12.0–46.0)
Lymphs Abs: 2.4 10*3/uL (ref 0.7–4.0)
MCHC: 33.2 g/dL (ref 30.0–36.0)
Monocytes Relative: 10 % (ref 3.0–12.0)
Neutro Abs: 3.6 10*3/uL (ref 1.4–7.7)
RBC: 3.87 Mil/uL — ABNORMAL LOW (ref 4.22–5.81)
RDW: 13.6 % (ref 11.5–14.6)

## 2012-09-18 LAB — HEPATIC FUNCTION PANEL
Alkaline Phosphatase: 82 U/L (ref 39–117)
Bilirubin, Direct: 0.1 mg/dL (ref 0.0–0.3)
Total Bilirubin: 0.6 mg/dL (ref 0.3–1.2)

## 2012-09-18 LAB — LIPID PANEL
HDL: 42.3 mg/dL (ref >39.00)
LDL Cholesterol: 68 mg/dL (ref 0–99)
Total CHOL/HDL Ratio: 3
VLDL: 14.2 mg/dL (ref 0.0–40.0)

## 2012-09-18 LAB — BASIC METABOLIC PANEL
CO2: 25 mEq/L (ref 19–32)
Calcium: 9.2 mg/dL (ref 8.4–10.5)
GFR: 108.98 mL/min (ref >60.00)
Sodium: 138 mEq/L (ref 135–145)

## 2012-09-18 MED ORDER — SIMVASTATIN 20 MG PO TABS: 20.0000 mg | ORAL_TABLET | Freq: Every day | ORAL | Status: DC

## 2012-09-18 MED ORDER — LOSARTAN POTASSIUM 25 MG PO TABS: 25.0000 mg | ORAL_TABLET | Freq: Every day | ORAL | Status: DC

## 2012-09-18 NOTE — Addendum Note (Signed)
Addended by: Aniceto Boss A on: 09/18/2012 10:05 AM   Modules accepted: Orders

## 2012-09-18 NOTE — Progress Notes (Signed)
  Subjective:    Patient ID: Kenneth [name], male    DOB: 12-Oct-1956, 56 y.o.   MRN: [id number]  HPI Here to follow up. He feels fine except he mentions a 3 month hx of a dry tickling cough that comes at night mostly. No allergy symptoms. He has been on Lisinopril for about a year now. His am fasting glucoses average 120-140.    Review of Systems  Constitutional: Negative.   Respiratory: Positive for cough. Negative for choking, chest tightness, shortness of breath and wheezing.   Cardiovascular: Negative.        Objective:   Physical Exam  Constitutional: He appears well-developed and well-nourished.  Neck: No thyromegaly present.  Cardiovascular: Normal rate, regular rhythm, normal heart sounds and intact distal pulses.   Pulmonary/Chest: Effort normal and breath sounds normal.  Lymphadenopathy:    He has no cervical adenopathy.          Assessment & Plan:  It sounds like his cough is related to his ACE inhibitor. Stop the Lisinopril and switch to Losartan. Get fasting labs today

## 2012-09-20 ENCOUNTER — Encounter: Payer: Self-pay | Admitting: Family Medicine

## 2012-09-20 MED ORDER — POTASSIUM CHLORIDE ER 10 MEQ PO TBCR: 10.0000 meq | EXTENDED_RELEASE_TABLET | Freq: Every day | ORAL | Status: DC

## 2012-09-20 NOTE — Progress Notes (Signed)
Quick Note:  I left voice message for pt to return my call, med list was updated, sent script in for [place] con to pharmacy and put a copy of results in mail. ______

## 2012-09-20 NOTE — Addendum Note (Signed)
Addended by: Aniceto Boss A on: 09/20/2012 04:46 PM   Modules accepted: Orders

## 2012-09-21 NOTE — Progress Notes (Signed)
Quick Note:  I spoke with pt and went over all results. ______

## 2012-11-10 ENCOUNTER — Telehealth: Payer: Self-pay | Admitting: Family Medicine

## 2012-11-10 NOTE — Telephone Encounter (Signed)
Pt had flu shot about a month ago. He now feels like he's coming down w/something. States that because he is a diabetic, he doesn't know what he should take. Same thing happened last year. Please call.

## 2012-11-10 NOTE — Telephone Encounter (Signed)
Use Mucinex for congestion, Claritin for runny nose, and Robitussin for cough

## 2012-11-10 NOTE — Telephone Encounter (Signed)
I spoke with pt  

## 2013-02-16 ENCOUNTER — Other Ambulatory Visit: Payer: Self-pay | Admitting: Family Medicine

## 2013-03-26 ENCOUNTER — Telehealth: Payer: Self-pay | Admitting: Family Medicine

## 2013-03-26 NOTE — Telephone Encounter (Signed)
What is he referring to exactly?

## 2013-03-26 NOTE — Telephone Encounter (Signed)
Pt is requesting topical medication for pain/inflammation and send to [facility] ( fax # [phone] )

## 2013-03-27 NOTE — Telephone Encounter (Signed)
It is a compounded medication, see actual fax. Per Dr. Clent Ridges this is okay to send in, which I did and then sent to scan.

## 2013-05-18 ENCOUNTER — Encounter: Payer: Self-pay | Admitting: Family Medicine

## 2013-05-18 ENCOUNTER — Ambulatory Visit (INDEPENDENT_AMBULATORY_CARE_PROVIDER_SITE_OTHER): Payer: BC Managed Care – PPO | Admitting: Family Medicine

## 2013-05-18 VITALS — BP 136/78 | HR 95 | Temp 97.7°F | Wt 216.0 lb

## 2013-05-18 DIAGNOSIS — I10 Essential (primary) hypertension: Secondary | ICD-10-CM

## 2013-05-18 DIAGNOSIS — E119 Type 2 diabetes mellitus without complications: Secondary | ICD-10-CM

## 2013-05-18 DIAGNOSIS — E785 Hyperlipidemia, unspecified: Secondary | ICD-10-CM

## 2013-05-18 MED ORDER — INSULIN ASPART 100 UNIT/ML ~~LOC~~ SOLN: SUBCUTANEOUS | Status: DC

## 2013-05-18 MED ORDER — INSULIN GLARGINE 100 UNIT/ML SOLOSTAR PEN: 40.0000 [IU] | PEN_INJECTOR | Freq: Every day | SUBCUTANEOUS | Status: DC

## 2013-05-18 NOTE — Progress Notes (Signed)
  Subjective:    Patient ID: Kenneth [name], male    DOB: 1956-06-09, 57 y.o.   MRN: [id number]  HPI Here for follow up. He feels great. His random glucoses run from 180 to 240.    Review of Systems  Constitutional: Negative.   Respiratory: Negative.   Cardiovascular: Negative.        Objective:   Physical Exam  Constitutional: He appears well-developed and well-nourished.  Cardiovascular: Normal rate, regular rhythm, normal heart sounds and intact distal pulses.   Pulmonary/Chest: Effort normal and breath sounds normal.          Assessment & Plan:  He seems to be doing well. We will set up fasting labs soon

## 2013-05-28 ENCOUNTER — Other Ambulatory Visit: Payer: BC Managed Care – PPO

## 2013-05-28 LAB — LIPID PANEL
Cholesterol: 116 mg/dL (ref 0–200)
HDL: 32 mg/dL — ABNORMAL LOW (ref >39.00)
Triglycerides: 70 mg/dL (ref 0.0–149.0)
VLDL: 14 mg/dL (ref 0.0–40.0)

## 2013-05-28 LAB — BASIC METABOLIC PANEL
BUN: 13 mg/dL (ref 6–23)
Calcium: 9.2 mg/dL (ref 8.4–10.5)
Creatinine, Ser: 0.8 mg/dL (ref 0.4–1.5)
GFR: 122.25 mL/min (ref >60.00)
Glucose, Bld: 97 mg/dL (ref 70–99)

## 2013-05-28 LAB — HEPATIC FUNCTION PANEL
Albumin: 3.7 g/dL (ref 3.5–5.2)
Total Protein: 7.6 g/dL (ref 6.0–8.3)

## 2013-05-28 NOTE — Addendum Note (Signed)
Addended by: Bonnye Fava on: 05/28/2013 08:14 AM   Modules accepted: Orders

## 2013-05-30 MED ORDER — POTASSIUM CHLORIDE CRYS ER 20 MEQ PO TBCR: 20.0000 meq | EXTENDED_RELEASE_TABLET | Freq: Every day | ORAL | Status: DC

## 2013-05-30 NOTE — Progress Notes (Signed)
Quick Note:  I spoke with pt and send script e-scribe ______

## 2013-05-30 NOTE — Addendum Note (Signed)
Addended by: Aniceto Boss A on: 05/30/2013 01:46 PM   Modules accepted: Orders

## 2013-09-13 ENCOUNTER — Telehealth: Payer: Self-pay | Admitting: Family Medicine

## 2013-09-13 NOTE — Telephone Encounter (Signed)
I spoke with pt and this is not something he is going to start on. I tried to call the company and the mail box was full.

## 2013-09-13 NOTE — Telephone Encounter (Signed)
[facility] foll up on request for the topical compound for pt. pls advise,

## 2013-09-19 ENCOUNTER — Other Ambulatory Visit: Payer: Self-pay | Admitting: Family Medicine

## 2013-09-20 ENCOUNTER — Telehealth: Payer: Self-pay | Admitting: Family Medicine

## 2013-09-20 NOTE — Telephone Encounter (Signed)
meds had already been called in for pt. Opened in error.

## 2013-09-21 ENCOUNTER — Other Ambulatory Visit: Payer: Self-pay | Admitting: Family Medicine

## 2013-10-31 ENCOUNTER — Telehealth: Payer: Self-pay | Admitting: Family Medicine

## 2013-10-31 NOTE — Telephone Encounter (Signed)
I spoke with pt and he does not want to order this cream.

## 2013-10-31 NOTE — Telephone Encounter (Signed)
Pt returning your call concerning that pain cream.

## 2013-12-11 ENCOUNTER — Telehealth: Payer: Self-pay | Admitting: Family Medicine

## 2013-12-11 MED ORDER — INSULIN GLARGINE 100 UNIT/ML SOLOSTAR PEN: 40.0000 [IU] | PEN_INJECTOR | Freq: Every day | SUBCUTANEOUS | Status: DC

## 2013-12-11 NOTE — Telephone Encounter (Signed)
I sent script e-scribe. 

## 2013-12-11 NOTE — Telephone Encounter (Signed)
Pt needs refill on lantus solostar sent to [facility]

## 2013-12-22 ENCOUNTER — Other Ambulatory Visit: Payer: Self-pay | Admitting: Family Medicine

## 2014-01-07 ENCOUNTER — Telehealth: Payer: Self-pay | Admitting: Family Medicine

## 2014-01-07 NOTE — Telephone Encounter (Signed)
I spoke with pt and he would like to order his diabetic testing supplies from another company, instead of [facility]. I have a fax with this request, howevery there is no name on it. Pt said to fill out and fax back to [phone], which I did.

## 2014-01-21 ENCOUNTER — Ambulatory Visit (INDEPENDENT_AMBULATORY_CARE_PROVIDER_SITE_OTHER): Payer: BC Managed Care – PPO | Admitting: Family Medicine

## 2014-01-21 ENCOUNTER — Encounter: Payer: Self-pay | Admitting: Family Medicine

## 2014-01-21 VITALS — BP 124/76 | HR 81 | Temp 97.8°F | Ht 69.0 in | Wt 200.0 lb

## 2014-01-21 DIAGNOSIS — I10 Essential (primary) hypertension: Secondary | ICD-10-CM

## 2014-01-21 DIAGNOSIS — E119 Type 2 diabetes mellitus without complications: Secondary | ICD-10-CM

## 2014-01-21 DIAGNOSIS — Z0000 Encounter for general adult medical examination without abnormal findings: Secondary | ICD-10-CM

## 2014-01-21 DIAGNOSIS — E785 Hyperlipidemia, unspecified: Secondary | ICD-10-CM

## 2014-01-21 LAB — POCT URINALYSIS DIPSTICK
Bilirubin, UA: NEGATIVE
Blood, UA: NEGATIVE
KETONES UA: NEGATIVE
LEUKOCYTES UA: NEGATIVE
Nitrite, UA: NEGATIVE
PROTEIN UA: NEGATIVE
Spec Grav, UA: 1.015
Urobilinogen, UA: 0.2
pH, UA: 5.5

## 2014-01-21 LAB — LIPID PANEL
CHOL/HDL RATIO: 3
CHOLESTEROL: 134 mg/dL (ref 0–200)
HDL: 38.8 mg/dL — ABNORMAL LOW (ref >39.00)
LDL CALC: 78 mg/dL (ref 0–99)
TRIGLYCERIDES: 87 mg/dL (ref 0.0–149.0)
VLDL: 17.4 mg/dL (ref 0.0–40.0)

## 2014-01-21 LAB — TSH: TSH: 3.24 u[IU]/mL (ref 0.35–5.50)

## 2014-01-21 LAB — HEPATIC FUNCTION PANEL
ALT: 14 U/L (ref 0–53)
AST: 11 U/L (ref 0–37)
Albumin: 4 g/dL (ref 3.5–5.2)
Alkaline Phosphatase: 91 U/L (ref 39–117)
BILIRUBIN TOTAL: 0.7 mg/dL (ref 0.3–1.2)
Bilirubin, Direct: 0 mg/dL (ref 0.0–0.3)
Total Protein: 7.7 g/dL (ref 6.0–8.3)

## 2014-01-21 LAB — CBC WITH DIFFERENTIAL/PLATELET
BASOS ABS: 0 10*3/uL (ref 0.0–0.1)
Basophils Relative: 0.4 % (ref 0.0–3.0)
Eosinophils Absolute: 0.1 10*3/uL (ref 0.0–0.7)
Eosinophils Relative: 0.9 % (ref 0.0–5.0)
HEMATOCRIT: 41.4 % (ref 39.0–52.0)
HEMOGLOBIN: 13.9 g/dL (ref 13.0–17.0)
LYMPHS PCT: 41.9 % (ref 12.0–46.0)
Lymphs Abs: 2.8 10*3/uL (ref 0.7–4.0)
MCHC: 33.6 g/dL (ref 30.0–36.0)
MCV: 92.7 fl (ref 78.0–100.0)
MONOS PCT: 7.9 % (ref 3.0–12.0)
Monocytes Absolute: 0.5 10*3/uL (ref 0.1–1.0)
NEUTROS ABS: 3.3 10*3/uL (ref 1.4–7.7)
Neutrophils Relative %: 48.9 % (ref 43.0–77.0)
Platelets: 300 10*3/uL (ref 150.0–400.0)
RBC: 4.46 Mil/uL (ref 4.22–5.81)
RDW: 13 % (ref 11.5–14.6)
WBC: 6.7 10*3/uL (ref 4.5–10.5)

## 2014-01-21 LAB — BASIC METABOLIC PANEL
BUN: 14 mg/dL (ref 6–23)
CHLORIDE: 99 meq/L (ref 96–112)
CO2: 30 mEq/L (ref 19–32)
CREATININE: 1 mg/dL (ref 0.4–1.5)
Calcium: 9.6 mg/dL (ref 8.4–10.5)
GFR: 95.31 mL/min (ref >60.00)
Glucose, Bld: 344 mg/dL — ABNORMAL HIGH (ref 70–99)
Potassium: 4.2 mEq/L (ref 3.5–5.1)
Sodium: 135 mEq/L (ref 135–145)

## 2014-01-21 LAB — HEMOGLOBIN A1C: Hgb A1c MFr Bld: 14 % — ABNORMAL HIGH (ref 4.6–6.5)

## 2014-01-21 NOTE — Progress Notes (Signed)
   Subjective:    Patient ID: Kenneth [name], male    DOB: 1956/03/19, 58 y.o.   MRN: [id number]  HPI Here for follow up. He feels fine. He had been gaining a lot of weight, and at one point he had reached 250 lbs. He decided this was due to the Novolog he was taking, so 2 months ago he stopped using Novolog altogether. He stayed on the other meds as directed. His glucoses in the mornings after breakfast run from 180 to 220.    Review of Systems  Constitutional: Negative.   Respiratory: Negative.   Cardiovascular: Negative.        Objective:   Physical Exam  Constitutional: He appears well-developed and well-nourished.  Cardiovascular: Normal rate, regular rhythm, normal heart sounds and intact distal pulses.   Pulmonary/Chest: Effort normal and breath sounds normal.          Assessment & Plan:  He is fasting so we will get labs today.

## 2014-01-22 ENCOUNTER — Telehealth: Payer: Self-pay | Admitting: Family Medicine

## 2014-01-22 NOTE — Telephone Encounter (Signed)
Relevant patient education mailed to patient.  

## 2014-01-24 NOTE — Addendum Note (Signed)
Addended by: Alysia Penna A on: 01/24/2014 02:15 PM   Modules accepted: Orders

## 2014-01-25 NOTE — Addendum Note (Signed)
Addended by: Alysia Penna A on: 01/25/2014 01:06 PM   Modules accepted: Orders

## 2014-01-30 ENCOUNTER — Telehealth: Payer: Self-pay | Admitting: Family Medicine

## 2014-01-30 ENCOUNTER — Ambulatory Visit: Payer: BC Managed Care – PPO | Admitting: Endocrinology

## 2014-01-30 NOTE — Telephone Encounter (Signed)
Dr. Fry is aware.  

## 2014-01-30 NOTE — Telephone Encounter (Signed)
Pt is calling to let md know he rescheduled his appt w/dr ellison until 02-15-14

## 2014-01-31 ENCOUNTER — Encounter: Payer: Self-pay | Admitting: Internal Medicine

## 2014-02-15 ENCOUNTER — Ambulatory Visit: Payer: BC Managed Care – PPO | Admitting: Endocrinology

## 2014-02-20 ENCOUNTER — Ambulatory Visit (INDEPENDENT_AMBULATORY_CARE_PROVIDER_SITE_OTHER): Payer: BC Managed Care – PPO | Admitting: Endocrinology

## 2014-02-20 ENCOUNTER — Encounter: Payer: Self-pay | Admitting: Endocrinology

## 2014-02-20 VITALS — BP 122/60 | HR 69 | Temp 98.2°F | Ht 69.0 in | Wt 202.0 lb

## 2014-02-20 DIAGNOSIS — E1029 Type 1 diabetes mellitus with other diabetic kidney complication: Secondary | ICD-10-CM

## 2014-02-20 MED ORDER — GLUCOSE BLOOD VI STRP: 1.0000 | ORAL_STRIP | Freq: Four times a day (QID) | Status: DC

## 2014-02-20 NOTE — Progress Notes (Signed)
Subjective:    Patient ID: Kenneth [name], male    DOB: Apr 12, 1956, 58 y.o.   MRN: [id number]  HPI pt states DM was dx'ed in 2008; he has mild if any neuropathy of the lower extremities, but he has associated renal disease.  he has been on insulin since dx.  He has had DKA twice--once at the time of dx, and the oother was approx 2011; he says these were related to alcoholism; pt says his diet and exercise are much better now, and he no longer consumes etoh.   Pt says glycemic control has been compromised by 2 deaths in his family 2 months ago.  Since then, he says control is much better.   Past Medical History  Diagnosis Date  . Diabetes mellitus   . Alcohol abuse   . Chronic kidney disease   . Hypertension   . Angina     No past surgical history on file.  History   Social History  . Marital Status: Single    Spouse Name: N/A    Number of Children: N/A  . Years of Education: N/A   Occupational History  . Not on file.   Social History Main Topics  . Smoking status: Former Smoker -- 0.50 packs/day    Types: Cigarettes    Quit date: 01/30/2012  . Smokeless tobacco: Never Used  . Alcohol Use: No  . Drug Use: No  . Sexual Activity: Not on file   Other Topics Concern  . Not on file   Social History Narrative  . No narrative on file    Current Outpatient Prescriptions on File Prior to Visit  Medication Sig Dispense Refill  . acetaminophen (TYLENOL) 500 MG tablet Take 1,000 mg by mouth every 6 (six) hours as needed. For pain      . Blood Glucose Monitoring Suppl (ONE TOUCH ULTRA SYSTEM KIT) W/DEVICE KIT 1 kit by Does not apply route 3 (three) times daily.        . insulin aspart (NOVOLOG FLEXPEN) 100 UNIT/ML injection Give 15 units with breakfast, 15 units with lunch, and 10 units with supper  1 vial  0  . Insulin Glargine (LANTUS SOLOSTAR) 100 UNIT/ML SOPN Inject 40 Units into the skin at bedtime.  45 mL  6  . Lancets MISC 1 each by Does not apply route 3 (three)  times daily.        Marland Kitchen losartan (COZAAR) 25 MG tablet TAKE ONE TABLET BY MOUTH EVERY DAY  30 tablet  11  . ONE TOUCH ULTRA TEST test strip TEST 3 TIMES DAILY  100 each  6  . potassium chloride SA (KLOR-CON M20) 20 MEQ tablet Take 1 tablet (20 mEq total) by mouth daily.  30 tablet  11  . simvastatin (ZOCOR) 20 MG tablet TAKE ONE TABLET BY MOUTH AT BEDTIME  30 tablet  11  . sildenafil (VIAGRA) 100 MG tablet Take 0.5-1 tablets (50-100 mg total) by mouth daily as needed for erectile dysfunction.  5 tablet  11   No current facility-administered medications on file prior to visit.    No Known Allergies  Family History  Problem Relation Age of Onset  . Diabetes Father   . Hypertension Father   . Alcohol abuse Father   . Diabetes Sister     BP 122/60  Pulse 69  Temp(Src) 98.2 F (36.8 C) (Oral)  Ht 5' 9"  (1.753 m)  Wt 202 lb (91.627 kg)  BMI 29.82 kg/m2  SpO2 97%  Review of Systems denies weight loss, blurry vision, headache, chest pain, sob, n/v, urinary frequency, cramps, excessive diaphoresis, memory loss, depression, cold intolerance, rhinorrhea, and easy bruising.      Objective:   Physical Exam VS: see vs page GEN: no distress HEAD: head: no deformity eyes: no periorbital swelling, no proptosis external nose and ears are normal mouth: no lesion seen NECK: supple, thyroid is not enlarged CHEST WALL: no deformity LUNGS: clear to auscultation BREASTS:  No gynecomastia CV: reg rate and rhythm, no murmur ABD: abdomen is soft, nontender.  no hepatosplenomegaly.  not distended.  no hernia MUSCULOSKELETAL: muscle bulk and strength are grossly normal.  no obvious joint swelling.  gait is normal and steady PULSES: no carotid bruit NEURO:  cn 2-12 grossly intact.   readily moves all 4's. SKIN:  Normal texture and temperature.  No rash or suspicious lesion is visible.   NODES:  None palpable at the neck PSYCH: alert, well-oriented.  Does not appear anxious nor depressed.  Lab  Results  Component Value Date   HGBA1C 14.0* 01/21/2014      Assessment & Plan:  Type 1 DM: very poor control: this causes very high risk to his health.  However, pt says control is improved.  The next step is to check cbg's on this insulin dosage. Smoking: this is very dangerous, in the setting of DM. Alcoholism: this raises the possibility of DM secondary to pancreatic insufficiency.  However, he does not seem to have exocrine insufficiency, so this seems unlikely. Bereavement: this compromises the rx of DM.

## 2014-02-20 NOTE — Patient Instructions (Addendum)
good diet and exercise habits significanly improve the control of your diabetes.  please let me know if you wish to be referred to a dietician.  high blood sugar is very risky to your health.  you should see an eye doctor every year.  You are at higher than average risk for pneumonia and hepatitis-B.  You should be vaccinated against both.   controlling your blood pressure and cholesterol drastically reduces the damage diabetes does to your body.  this also applies to quitting smoking.  please discuss these with your doctor.   check your blood sugar 4 times a day: before the 3 meals, and at bedtime.  also check if you have symptoms of your blood sugar being too high or too low.  please keep a record of the readings and bring it to your next appointment here.  You can write it on any piece of paper.  please call us sooner if your blood sugar goes below 70, or if you have a lot of readings over 200.     Please come back for a follow-up appointment in 2 weeks.

## 2014-02-21 ENCOUNTER — Other Ambulatory Visit: Payer: Self-pay | Admitting: *Deleted

## 2014-02-21 DIAGNOSIS — E1029 Type 1 diabetes mellitus with other diabetic kidney complication: Secondary | ICD-10-CM | POA: Insufficient documentation

## 2014-02-21 MED ORDER — INSULIN ASPART 100 UNIT/ML ~~LOC~~ SOLN: SUBCUTANEOUS | Status: DC

## 2014-03-11 ENCOUNTER — Ambulatory Visit (AMBULATORY_SURGERY_CENTER): Payer: Self-pay

## 2014-03-11 VITALS — Ht 71.0 in | Wt 207.0 lb

## 2014-03-11 DIAGNOSIS — Z1211 Encounter for screening for malignant neoplasm of colon: Secondary | ICD-10-CM

## 2014-03-11 MED ORDER — MOVIPREP 100 G PO SOLR: 1.0000 | Freq: Once | ORAL | Status: DC

## 2014-03-13 ENCOUNTER — Encounter: Payer: Self-pay | Admitting: Internal Medicine

## 2014-03-19 ENCOUNTER — Ambulatory Visit (INDEPENDENT_AMBULATORY_CARE_PROVIDER_SITE_OTHER): Payer: BC Managed Care – PPO | Admitting: Endocrinology

## 2014-03-19 ENCOUNTER — Encounter: Payer: Self-pay | Admitting: Endocrinology

## 2014-03-19 VITALS — BP 112/58 | HR 88 | Temp 98.4°F | Ht 69.0 in | Wt 208.0 lb

## 2014-03-19 DIAGNOSIS — I10 Essential (primary) hypertension: Secondary | ICD-10-CM

## 2014-03-19 NOTE — Patient Instructions (Addendum)
check your blood sugar 4 times a day: before the 3 meals, and at bedtime.  also check if you have symptoms of your blood sugar being too high or too low.  please keep a record of the readings and bring it to your next appointment here.  You can write it on any piece of paper.  please call us sooner if your blood sugar goes below 70, or if you have a lot of readings over 200.    Please stop taking the novolog, and: Increase the lantus to 80 units each morning.    Please come back for a follow-up appointment in 1 month.

## 2014-03-19 NOTE — Progress Notes (Signed)
Subjective:    Patient ID: Kenneth [name], male    DOB: 10-29-1956, 58 y.o.   MRN: [id number]  HPI pt returns for f/u of insulin-requiring DM (dx'ed 2008; he has mild if any neuropathy of the lower extremities, but he has associated renal disease; he has been on insulin since dx; he has had DKA twice--once at the time of dx, and the oother was approx 2011; he says these were related to alcoholism; he no longer consumes etoh).  no cbg record, but states cbg's vary from 190-320.  There is no trend throughout the day.  He says "there is no rhyme or reason to it."  Past Medical History  Diagnosis Date  . Diabetes mellitus   . Alcohol abuse   . Chronic kidney disease   . Hypertension   . Angina     No past surgical history on file.  History   Social History  . Marital Status: Single    Spouse Name: N/A    Number of Children: N/A  . Years of Education: N/A   Occupational History  . Not on file.   Social History Main Topics  . Smoking status: Former Smoker -- 0.50 packs/day    Types: Cigarettes    Quit date: 01/30/2012  . Smokeless tobacco: Never Used  . Alcohol Use: No  . Drug Use: No  . Sexual Activity: Not on file   Other Topics Concern  . Not on file   Social History Narrative  . No narrative on file    Current Outpatient Prescriptions on File Prior to Visit  Medication Sig Dispense Refill  . acetaminophen (TYLENOL) 500 MG tablet Take 1,000 mg by mouth every 6 (six) hours as needed. For pain      . glucose blood (ONE TOUCH ULTRA TEST) test strip 1 each by Other route 4 (four) times daily. And lancets 4/day, 250.01  120 each  12  . Lancets MISC 1 each by Does not apply route 3 (three) times daily.        Marland Kitchen losartan (COZAAR) 25 MG tablet TAKE ONE TABLET BY MOUTH EVERY DAY  30 tablet  11  . MOVIPREP 100 G SOLR Take 1 kit (200 g total) by mouth once.  1 kit  0  . potassium chloride SA (KLOR-CON M20) 20 MEQ tablet Take 1 tablet (20 mEq total) by mouth daily.  30 tablet   11  . simvastatin (ZOCOR) 20 MG tablet TAKE ONE TABLET BY MOUTH AT BEDTIME  30 tablet  11  . sildenafil (VIAGRA) 100 MG tablet Take 0.5-1 tablets (50-100 mg total) by mouth daily as needed for erectile dysfunction.  5 tablet  11   No current facility-administered medications on file prior to visit.    No Known Allergies  Family History  Problem Relation Age of Onset  . Diabetes Father   . Hypertension Father   . Alcohol abuse Father   . Diabetes Sister   . Breast cancer Mother   . Colon cancer Neg Hx   . Pancreatic cancer Neg Hx   . Stomach cancer Neg Hx     BP 112/58  Pulse 88  Temp(Src) 98.4 F (36.9 C) (Oral)  Ht _0  (1.753 m)  Wt 208 lb (94.348 kg)  BMI 30.70 kg/m2  SpO2 96%  Review of Systems He denies hypoglycemia and weight change    Objective:   Physical Exam VITAL SIGNS:  See vs page GENERAL: no distress SKIN:  Insulin injection sites  at the anterior thighs are normal.        Assessment & Plan:  DM: poor control.  He needs to change to qd insulin. Alcoholism: this raises the possibility of DM secondary to pancreatic insufficiency.  However, he does not seem to have exocrine insufficiency, so this seems unlikely.

## 2014-03-25 ENCOUNTER — Encounter: Payer: BC Managed Care – PPO | Admitting: Internal Medicine

## 2014-04-18 ENCOUNTER — Encounter: Payer: Self-pay | Admitting: Endocrinology

## 2014-04-18 ENCOUNTER — Ambulatory Visit (INDEPENDENT_AMBULATORY_CARE_PROVIDER_SITE_OTHER): Payer: BC Managed Care – PPO | Admitting: Endocrinology

## 2014-04-18 VITALS — BP 118/68 | HR 72 | Temp 98.5°F | Ht 69.0 in | Wt 215.0 lb

## 2014-04-18 DIAGNOSIS — E1029 Type 1 diabetes mellitus with other diabetic kidney complication: Secondary | ICD-10-CM

## 2014-04-18 NOTE — Patient Instructions (Addendum)
check your blood sugar 4 times a day: before the 3 meals, and at bedtime.  also check if you have symptoms of your blood sugar being too high or too low.  please keep a record of the readings and bring it to your next appointment here.  You can write it on any piece of paper.  please call us sooner if your blood sugar goes below 70, or if you have a lot of readings over 200.    Increase the lantus to 100 units each morning.    Please come back for a follow-up appointment in 2 months.  On this type of insulin schedule, you should eat meals on a regular schedule.  If a meal is missed or significantly delayed, your blood sugar could go low.

## 2014-04-18 NOTE — Progress Notes (Signed)
Subjective:    Patient ID: Kenneth [name], male    DOB: 10-24-1956, 58 y.o.   MRN: [id number]  HPI pt returns for f/u of insulin-requiring DM (dx'ed 2008; he has mild if any neuropathy of the lower extremities, but he has associated renal disease; he has been on insulin since dx; he has had DKA twice--once at the time of dx, and the other was approx 2011; he says these were related to alcoholism; he no longer consumes etoh; in early 2015, he was changed to a simpler qd insulin regimen, due to poor results with multiple daily injections).  no cbg record, but states cbg's vary from 190-251.  There is no trend throughout the day.  pt states he feels well in general.   Past Medical History  Diagnosis Date  . Diabetes mellitus   . Alcohol abuse   . Chronic kidney disease   . Hypertension   . Angina     No past surgical history on file.  History   Social History  . Marital Status: Single    Spouse Name: N/A    Number of Children: N/A  . Years of Education: N/A   Occupational History  . Not on file.   Social History Main Topics  . Smoking status: Former Smoker -- 0.50 packs/day    Types: Cigarettes    Quit date: 01/30/2012  . Smokeless tobacco: Never Used  . Alcohol Use: No  . Drug Use: No  . Sexual Activity: Not on file   Other Topics Concern  . Not on file   Social History Narrative  . No narrative on file    Current Outpatient Prescriptions on File Prior to Visit  Medication Sig Dispense Refill  . acetaminophen (TYLENOL) 500 MG tablet Take 1,000 mg by mouth every 6 (six) hours as needed. For pain      . glucose blood (ONE TOUCH ULTRA TEST) test strip 1 each by Other route 4 (four) times daily. And lancets 4/day, 250.01  120 each  12  . Insulin Glargine (LANTUS) 100 UNIT/ML Solostar Pen Inject 100 Units into the skin every morning.       . Lancets MISC 1 each by Does not apply route 3 (three) times daily.        Marland Kitchen losartan (COZAAR) 25 MG tablet TAKE ONE TABLET BY  MOUTH EVERY DAY  30 tablet  11  . MOVIPREP 100 G SOLR Take 1 kit (200 g total) by mouth once.  1 kit  0  . potassium chloride SA (KLOR-CON M20) 20 MEQ tablet Take 1 tablet (20 mEq total) by mouth daily.  30 tablet  11  . simvastatin (ZOCOR) 20 MG tablet TAKE ONE TABLET BY MOUTH AT BEDTIME  30 tablet  11  . sildenafil (VIAGRA) 100 MG tablet Take 0.5-1 tablets (50-100 mg total) by mouth daily as needed for erectile dysfunction.  5 tablet  11   No current facility-administered medications on file prior to visit.    No Known Allergies  Family History  Problem Relation Age of Onset  . Diabetes Father   . Hypertension Father   . Alcohol abuse Father   . Diabetes Sister   . Breast cancer Mother   . Colon cancer Neg Hx   . Pancreatic cancer Neg Hx   . Stomach cancer Neg Hx     BP 118/68  Pulse 72  Temp(Src) 98.5 F (36.9 C) (Oral)  Ht 5' 9"  (1.753 m)  Wt 215 lb (97.523  kg)  BMI 31.74 kg/m2  SpO2 98%  Review of Systems He denies hypoglycemia and weight change.      Objective:   Physical Exam VITAL SIGNS:  See vs page GENERAL: no distress PSYCH: Alert and well-oriented.  Does not appear anxious nor depressed.    Lab Results  Component Value Date   HGBA1C 14.0* 01/21/2014      Assessment & Plan:  DM: he needs increased rx.  This insulin regimen was chosen from multiple options, for its simplicity.  The benefits of glycemic control must be weighed against the risks of hypoglycemia.   Alcoholism: this raises the possibility of DM secondary to pancreatic insufficiency.  However, he does not seem to have exocrine insufficiency, so this seems unlikely.   Smoking: this accelerates the development of chronic complications.

## 2014-04-22 ENCOUNTER — Telehealth: Payer: Self-pay | Admitting: Endocrinology

## 2014-04-22 MED ORDER — INSULIN GLARGINE 100 UNIT/ML SOLOSTAR PEN: 100.0000 [IU] | PEN_INJECTOR | SUBCUTANEOUS | Status: DC

## 2014-04-22 NOTE — Telephone Encounter (Signed)
Rx for Lantus sent to pharmacy.

## 2014-04-22 NOTE — Telephone Encounter (Signed)
Please call in the lantus rx pt states the pharmacy does not have the updated rx for him

## 2014-05-06 ENCOUNTER — Encounter: Payer: Self-pay | Admitting: Internal Medicine

## 2014-05-06 ENCOUNTER — Ambulatory Visit (AMBULATORY_SURGERY_CENTER): Payer: BC Managed Care – PPO | Admitting: Internal Medicine

## 2014-05-06 VITALS — BP 124/78 | HR 70 | Temp 97.4°F | Resp 22 | Ht 71.0 in | Wt 207.0 lb

## 2014-05-06 DIAGNOSIS — Z1211 Encounter for screening for malignant neoplasm of colon: Secondary | ICD-10-CM

## 2014-05-06 HISTORY — PX: COLONOSCOPY: SHX174

## 2014-05-06 LAB — GLUCOSE, CAPILLARY
GLUCOSE-CAPILLARY: 88 mg/dL (ref 70–99)
GLUCOSE-CAPILLARY: 85 mg/dL (ref 70–99)

## 2014-05-06 MED ORDER — SODIUM CHLORIDE 0.9 % IV SOLN: 500.0000 mL | INTRAVENOUS | Status: DC

## 2014-05-06 NOTE — Patient Instructions (Signed)
Discharge instructions given with verbal understanding. Normal exam. Resume previous medications. YOU HAD AN ENDOSCOPIC PROCEDURE TODAY AT THE [facility]: Refer to the procedure report that was given to you for any specific questions about what was found during the examination.  If the procedure report does not answer your questions, please call your gastroenterologist to clarify.  If you requested that your care partner not be given the details of your procedure findings, then the procedure report has been included in a sealed envelope for you to review at your convenience later.  YOU SHOULD EXPECT: Some feelings of bloating in the abdomen. Passage of more gas than usual.  Walking can help get rid of the air that was put into your GI tract during the procedure and reduce the bloating. If you had a lower endoscopy (such as a colonoscopy or flexible sigmoidoscopy) you may notice spotting of blood in your stool or on the toilet paper. If you underwent a bowel prep for your procedure, then you may not have a normal bowel movement for a few days.  DIET: Your first meal following the procedure should be a light meal and then it is ok to progress to your normal diet.  A half-sandwich or bowl of soup is an example of a good first meal.  Heavy or fried foods are harder to digest and may make you feel nauseous or bloated.  Likewise meals heavy in dairy and vegetables can cause extra gas to form and this can also increase the bloating.  Drink plenty of fluids but you should avoid alcoholic beverages for 24 hours.  ACTIVITY: Your care partner should take you home directly after the procedure.  You should plan to take it easy, moving slowly for the rest of the day.  You can resume normal activity the day after the procedure however you should NOT DRIVE or use heavy machinery for 24 hours (because of the sedation medicines used during the test).    SYMPTOMS TO REPORT IMMEDIATELY: A gastroenterologist  can be reached at any hour.  During normal business hours, 8:30 AM to 5:00 PM Monday through Friday, call [phone].  After hours and on weekends, please call the GI answering service at [phone] who will take a message and have the physician on call contact you.   Following lower endoscopy (colonoscopy or flexible sigmoidoscopy):  Excessive amounts of blood in the stool  Significant tenderness or worsening of abdominal pains  Swelling of the abdomen that is new, acute  Fever of 100F or higher  FOLLOW UP: If any biopsies were taken you will be contacted by phone or by letter within the next 1-3 weeks.  Call your gastroenterologist if you have not heard about the biopsies in 3 weeks.  Our staff will call the home number listed on your records the next business day following your procedure to check on you and address any questions or concerns that you may have at that time regarding the information given to you following your procedure. This is a courtesy call and so if there is no answer at the home number and we have not heard from you through the emergency physician on call, we will assume that you have returned to your regular daily activities without incident.  SIGNATURES/CONFIDENTIALITY: You and/or your care partner have signed paperwork which will be entered into your electronic medical record.  These signatures attest to the fact that that the information above on your After Visit Summary has been reviewed   and is understood.  Full responsibility of the confidentiality of this discharge information lies with you and/or your care-partner. 

## 2014-05-06 NOTE — Progress Notes (Signed)
Pt drowsy arousable by verbal tactile pain resp adequate on RA

## 2014-05-06 NOTE — Progress Notes (Signed)
Made CRNA Aware that pt. Had swallow of water at 12:20 p.m.

## 2014-05-06 NOTE — Op Note (Signed)
[facility]  [address] [place], [address]   COLONOSCOPY PROCEDURE REPORT  PATIENT: [name], Kenneth  MR#: [id number] BIRTHDATE: [birth year] , 48  yrs. old GENDER: Male ENDOSCOPIST: Eustace Quail, MD REFERRED [id number] Sarajane Jews, M.D. PROCEDURE DATE:  05/06/2014 PROCEDURE:   Colonoscopy, screening First Screening Colonoscopy - Avg.  risk and is 50 yrs.  old or older Yes.  Prior Negative Screening - Now for repeat screening. N/A  History of Adenoma - Now for follow-up colonoscopy & has been > or = to 3 yrs.  N/A  Polyps Removed Today? No.  Recommend repeat exam, <10 yrs? No. ASA CLASS:   Class II INDICATIONS:average risk screening. MEDICATIONS: MAC sedation, administered by CRNA and propofol (Diprivan) 240mg  IV  DESCRIPTION OF PROCEDURE:   After the risks benefits and alternatives of the procedure were thoroughly explained, informed consent was obtained.  A digital rectal exam revealed no abnormalities of the rectum.   The LB [id number]  endoscope was introduced through the anus and advanced to the cecum, which was identified by both the appendix and ileocecal valve. No adverse events experienced.   The quality of the prep was excellent, using MoviPrep  The instrument was then slowly withdrawn as the colon was fully examined.   COLON FINDINGS: A normal appearing cecum, ileocecal valve, and appendiceal orifice were identified.  The ascending, hepatic flexure, transverse, splenic flexure, descending, sigmoid colon and rectum appeared unremarkable.  No polyps or cancers were seen. Retroflexed views revealed no abnormalities. The time to cecum=2 minutes 24 seconds.  Withdrawal time=9 minutes 39 seconds.  The scope was withdrawn and the procedure completed.  COMPLICATIONS: There were no complications.  ENDOSCOPIC IMPRESSION: Normal colon  RECOMMENDATIONS: Continue current colorectal screening recommendations for "routine risk" patients with a repeat  colonoscopy in 10 years.   eSigned:  Eustace Quail, MD 05/06/2014 2:26 PM   cc: Laurey Morale, MD and The Patient

## 2014-05-07 ENCOUNTER — Telehealth: Payer: Self-pay | Admitting: *Deleted

## 2014-05-07 NOTE — Telephone Encounter (Signed)
  Follow up Call-  Call back number 05/06/2014  Post procedure Call Back phone  # [phone]  Permission to leave phone message Yes     Patient questions:  Do you have a fever, pain , or abdominal swelling? no Pain Score  0 *  Have you tolerated food without any problems? yes  Have you been able to return to your normal activities? yes  Do you have any questions about your discharge instructions: Diet   no Medications  no Follow up visit  no  Do you have questions or concerns about your Care? no  Actions: * If pain score is 4 or above: No action needed, pain <4.

## 2014-05-16 ENCOUNTER — Other Ambulatory Visit: Payer: Self-pay | Admitting: Family Medicine

## 2014-06-17 ENCOUNTER — Telehealth: Payer: Self-pay | Admitting: Family Medicine

## 2014-06-17 NOTE — Telephone Encounter (Signed)
[facility] called for pt requesting a "breathe 2 meter" and the test strips and lancets to go with that Need testing frequency

## 2014-06-18 ENCOUNTER — Ambulatory Visit (INDEPENDENT_AMBULATORY_CARE_PROVIDER_SITE_OTHER): Payer: BC Managed Care – PPO | Admitting: Endocrinology

## 2014-06-18 ENCOUNTER — Encounter: Payer: Self-pay | Admitting: Endocrinology

## 2014-06-18 VITALS — BP 118/66 | HR 78 | Temp 98.5°F | Ht 71.0 in | Wt 217.0 lb

## 2014-06-18 DIAGNOSIS — R202 Paresthesia of skin: Secondary | ICD-10-CM

## 2014-06-18 DIAGNOSIS — R200 Anesthesia of skin: Secondary | ICD-10-CM

## 2014-06-18 DIAGNOSIS — R209 Unspecified disturbances of skin sensation: Secondary | ICD-10-CM

## 2014-06-18 DIAGNOSIS — E1029 Type 1 diabetes mellitus with other diabetic kidney complication: Secondary | ICD-10-CM

## 2014-06-18 LAB — BASIC METABOLIC PANEL
BUN: 16 mg/dL (ref 6–23)
CALCIUM: 9.8 mg/dL (ref 8.4–10.5)
CO2: 28 mEq/L (ref 19–32)
Chloride: 99 mEq/L (ref 96–112)
Creatinine, Ser: 1 mg/dL (ref 0.4–1.5)
GFR: 97.33 mL/min (ref >60.00)
GLUCOSE: 184 mg/dL — AB (ref 70–99)
POTASSIUM: 4.2 meq/L (ref 3.5–5.1)
SODIUM: 134 meq/L — AB (ref 135–145)

## 2014-06-18 LAB — HEMOGLOBIN A1C: Hgb A1c MFr Bld: 8.1 % — ABNORMAL HIGH (ref 4.6–6.5)

## 2014-06-18 MED ORDER — INSULIN GLARGINE 100 UNIT/ML SOLOSTAR PEN: 130.0000 [IU] | PEN_INJECTOR | SUBCUTANEOUS | Status: DC

## 2014-06-18 NOTE — Progress Notes (Signed)
Subjective:    Patient ID: Kenneth [name], male    DOB: 08-Nov-1956, 58 y.o.   MRN: [id number]  HPI pt returns for f/u of insulin-requiring DM (dx'ed 2008; he has mild if any neuropathy of the lower extremities, but he has associated renal disease; he has been on insulin since dx; he has had DKA twice--once at the time of dx, and the other was approx 2011; he says these were related to alcoholism; he no longer consumes etoh; in early 2015, he was changed to a simpler qd insulin regimen, due to poor results with multiple daily injections).  pt states he feels well in general.  Pt says he never misses the insulin.  no cbg record, but states cbg's vary from 160-240.  There is no trend throughout the day.  Past Medical History  Diagnosis Date  . Diabetes mellitus   . Alcohol abuse   . Chronic kidney disease   . Hypertension   . Angina   . Hyperlipidemia     No past surgical history on file.  History   Social History  . Marital Status: Single    Spouse Name: N/A    Number of Children: N/A  . Years of Education: N/A   Occupational History  . Not on file.   Social History Main Topics  . Smoking status: Former Smoker -- 0.50 packs/day    Types: Cigarettes    Quit date: 01/30/2012  . Smokeless tobacco: Never Used  . Alcohol Use: No  . Drug Use: No  . Sexual Activity: Not on file   Other Topics Concern  . Not on file   Social History Narrative  . No narrative on file    Current Outpatient Prescriptions on File Prior to Visit  Medication Sig Dispense Refill  . acetaminophen (TYLENOL) 500 MG tablet Take 1,000 mg by mouth every 6 (six) hours as needed. For pain      . glucose blood (ONE TOUCH ULTRA TEST) test strip 1 each by Other route 4 (four) times daily. And lancets 4/day, 250.01  120 each  12  . Lancets MISC 1 each by Does not apply route 3 (three) times daily.        Marland Kitchen losartan (COZAAR) 25 MG tablet TAKE ONE TABLET BY MOUTH EVERY DAY  30 tablet  11  . potassium  chloride SA (KLOR-CON M20) 20 MEQ tablet Take 1 tablet (20 mEq total) by mouth daily.  30 tablet  11  . simvastatin (ZOCOR) 20 MG tablet TAKE ONE TABLET BY MOUTH AT BEDTIME  30 tablet  11  . sildenafil (VIAGRA) 100 MG tablet Take 0.5-1 tablets (50-100 mg total) by mouth daily as needed for erectile dysfunction.  5 tablet  11   No current facility-administered medications on file prior to visit.    No Known Allergies  Family History  Problem Relation Age of Onset  . Diabetes Father   . Hypertension Father   . Alcohol abuse Father   . Diabetes Sister   . Breast cancer Mother   . Colon cancer Neg Hx   . Pancreatic cancer Neg Hx   . Stomach cancer Neg Hx     BP 118/66  Pulse 78  Temp(Src) 98.5 F (36.9 C) (Oral)  Ht 5\' 11"  (1.803 m)  Wt 217 lb (98.431 kg)  BMI 30.28 kg/m2  SpO2 97%   Review of Systems He denies hypoglycemia.  He has gained weight.  He has slight tingling of the feet.  Objective:   Physical Exam VITAL SIGNS:  See vs page GENERAL: no distress Pulses: dorsalis pedis intact bilat.   Feet: no deformity. normal color and temp.  no edema. There is bilateral onychomycosis.  Skin:  no ulcer on the feet.   Neuro: sensation is intact to touch on the feet.     Lab Results  Component Value Date   HGBA1C 8.1* 06/18/2014   Lab Results  Component Value Date   CREATININE 1.0 06/18/2014   BUN 16 06/18/2014   NA 134* 06/18/2014   K 4.2 06/18/2014   CL 99 06/18/2014   CO2 28 06/18/2014  B-12=normal    Assessment & Plan:  DM: moderate exacerbation.  Weight gain: this complicates the rx of DM: This impairs the ability to achieve glycemic control.  I'll work around this as best I can.  tingling of feet, new: prob due to DM.   Patient is advised the following: Patient Instructions  check your blood sugar 4 times a day: before the 3 meals, and at bedtime.  also check if you have symptoms of your blood sugar being too high or too low.  please keep a record of the  readings and bring it to your next appointment here.  You can write it on any piece of paper.  please call us sooner if your blood sugar goes below 70, or if you have a lot of readings over 200.   Increase the lantus to 130 units each morning.   Please come back for a follow-up appointment in 3 months.   On this type of insulin schedule, you should eat meals on a regular schedule.  If a meal is missed or significantly delayed, your blood sugar could go low.

## 2014-06-18 NOTE — Patient Instructions (Addendum)
check your blood sugar 4 times a day: before the 3 meals, and at bedtime.  also check if you have symptoms of your blood sugar being too high or too low.  please keep a record of the readings and bring it to your next appointment here.  You can write it on any piece of paper.  please call us sooner if your blood sugar goes below 70, or if you have a lot of readings over 200.   Increase the lantus to 130 units each morning.   Please come back for a follow-up appointment in 3 months.   On this type of insulin schedule, you should eat meals on a regular schedule.  If a meal is missed or significantly delayed, your blood sugar could go low.

## 2014-06-19 LAB — VITAMIN B12: Vitamin B-12: 419 pg/mL (ref 211–911)

## 2014-06-19 NOTE — Telephone Encounter (Signed)
I spoke with pharmacy and she is going to forward this request to Dr. Loanne Drilling.

## 2014-06-28 ENCOUNTER — Telehealth: Payer: Self-pay | Admitting: Family Medicine

## 2014-06-28 NOTE — Telephone Encounter (Signed)
[id number] [place] [address] [place] [address] is requesting re-fill on potassium chloride SA (KLOR-CON M20) 20 MEQ tablet

## 2014-07-01 MED ORDER — POTASSIUM CHLORIDE CRYS ER 20 MEQ PO TBCR: 20.0000 meq | EXTENDED_RELEASE_TABLET | Freq: Every day | ORAL | Status: DC

## 2014-07-01 NOTE — Telephone Encounter (Signed)
I sent script e-scribe. 

## 2014-08-09 ENCOUNTER — Other Ambulatory Visit: Payer: Self-pay | Admitting: Endocrinology

## 2014-08-09 DIAGNOSIS — E1029 Type 1 diabetes mellitus with other diabetic kidney complication: Secondary | ICD-10-CM

## 2014-08-22 ENCOUNTER — Telehealth: Payer: Self-pay | Admitting: Family Medicine

## 2014-08-22 NOTE — Telephone Encounter (Signed)
Elmyra Ricks called for a verbal order on [place] 2 meter    Phone number; [phone]  [facility]

## 2014-08-23 NOTE — Telephone Encounter (Signed)
I called [facility] and spoke with Gus the pharmacist and he wanted to verify how often the pt needs to check his blood sugar.  I informed Gus the pt should check his blood sugar twice a day since he is diabetic

## 2014-08-23 NOTE — Telephone Encounter (Signed)
And he requested supplies and quantities for the pt.  I advised Gus to call Dr Loanne Drilling (endocrinologist) as her the labs from 01/2014 Dr Sarajane Jews recommended Dr Loanne Drilling manage the pt and he was given the phone number to contact Dr Loanne Drilling.

## 2014-09-18 ENCOUNTER — Ambulatory Visit (INDEPENDENT_AMBULATORY_CARE_PROVIDER_SITE_OTHER): Payer: BC Managed Care – PPO | Admitting: Endocrinology

## 2014-09-18 ENCOUNTER — Encounter: Payer: Self-pay | Admitting: Endocrinology

## 2014-09-18 VITALS — BP 116/62 | HR 76 | Temp 97.9°F | Ht 71.0 in | Wt 222.0 lb

## 2014-09-18 DIAGNOSIS — E1029 Type 1 diabetes mellitus with other diabetic kidney complication: Secondary | ICD-10-CM

## 2014-09-18 DIAGNOSIS — Z23 Encounter for immunization: Secondary | ICD-10-CM

## 2014-09-18 LAB — HEMOGLOBIN A1C
Hgb A1c MFr Bld: 5.5 % (ref <5.7)
MEAN PLASMA GLUCOSE: 111 mg/dL (ref <117)

## 2014-09-18 MED ORDER — POTASSIUM CHLORIDE CRYS ER 20 MEQ PO TBCR: 20.0000 meq | EXTENDED_RELEASE_TABLET | Freq: Every day | ORAL | Status: DC

## 2014-09-18 NOTE — Progress Notes (Signed)
Subjective:    Patient ID: Kenneth [name], male    DOB: 20-Apr-1956, 58 y.o.   MRN: [id number]  HPI Pt returns for f/u of diabetes mellitus: DM type: insulin-requiring type 2 Dx'ed: 4315 Complications: renal insuficiency Therapy: insulin since dx DKA: once at the time of dx, and approx 2011 Pancreatitis: never Other:  pt says these were related to alcoholism; he no longer consumes etoh; in early 2015, he was changed to a simpler qd insulin regimen, due to poor results with multiple daily injections; he works 2nd shift, setting up for events Interval history: Pt says he never misses the insulin. pt states he feels well in general, except for weight gain.  no cbg record, but states cbg's vary from 190-240.  There is no trend throughout the day.   Past Medical History  Diagnosis Date  . Diabetes mellitus   . Alcohol abuse   . Chronic kidney disease   . Hypertension   . Angina   . Hyperlipidemia     No past surgical history on file.  History   Social History  . Marital Status: Single    Spouse Name: N/A    Number of Children: N/A  . Years of Education: N/A   Occupational History  . Not on file.   Social History Main Topics  . Smoking status: Former Smoker -- 0.50 packs/day    Types: Cigarettes    Quit date: 01/30/2012  . Smokeless tobacco: Never Used  . Alcohol Use: No  . Drug Use: No  . Sexual Activity: Not on file   Other Topics Concern  . Not on file   Social History Narrative  . No narrative on file    Current Outpatient Prescriptions on File Prior to Visit  Medication Sig Dispense Refill  . acetaminophen (TYLENOL) 500 MG tablet Take 1,000 mg by mouth every 6 (six) hours as needed. For pain      . glucose blood (ONE TOUCH ULTRA TEST) test strip 1 each by Other route 4 (four) times daily. And lancets 4/day, 250.01  120 each  12  . Lancets MISC 1 each by Does not apply route 3 (three) times daily.        Marland Kitchen losartan (COZAAR) 25 MG tablet TAKE ONE TABLET BY  MOUTH EVERY DAY  30 tablet  11  . simvastatin (ZOCOR) 20 MG tablet TAKE ONE TABLET BY MOUTH AT BEDTIME  30 tablet  11  . sildenafil (VIAGRA) 100 MG tablet Take 0.5-1 tablets (50-100 mg total) by mouth daily as needed for erectile dysfunction.  5 tablet  11   No current facility-administered medications on file prior to visit.    No Known Allergies  Family History  Problem Relation Age of Onset  . Diabetes Father   . Hypertension Father   . Alcohol abuse Father   . Diabetes Sister   . Breast cancer Mother   . Colon cancer Neg Hx   . Pancreatic cancer Neg Hx   . Stomach cancer Neg Hx     BP 116/62  Pulse 76  Temp(Src) 97.9 F (36.6 C) (Oral)  Ht 5\' 11"  (1.803 m)  Wt 222 lb (100.699 kg)  BMI 30.98 kg/m2  SpO2 98%  Review of Systems He denies hypoglycemia and n/v.      Objective:   Physical Exam VITAL SIGNS:  See vs page GENERAL: no distress Pulses: dorsalis pedis intact bilat.   Feet: no deformity.  no edema.  There is bilateral onychomycosis.  Skin:  no ulcer on the feet.  normal color and temp. Neuro: sensation is intact to touch on the feet  Lab Results  Component Value Date   HGBA1C 5.5 09/18/2014        Assessment & Plan:  DM: overcontrolled Noncompliance with cbg recording: I'll work around this as best I can. Obesity, persistent.  Patient is advised the following: Patient Instructions  check your blood sugar twice a day: vary the time of day, between before the 3 meals, and at bedtime.  also check if you have symptoms of your blood sugar being too high or too low.  please keep a record of the readings and bring it to your next appointment here.  You can write it on any piece of paper.  please call us sooner if your blood sugar goes below 70, or if you have a lot of readings over 200.   blood and urine tests are being requested for you today.  We'll contact you with results. Please come back for a follow-up appointment in January.   good diet and exercise  habits significanly improve the control of your diabetes.  please let me know if you wish to be referred to a dietician.  high blood sugar is very risky to your health.  you should see an eye doctor and dentist every year.  It is very important to get all recommended vaccinations.  On this type of insulin schedule, you should eat meals on a regular schedule.  If a meal is missed or significantly delayed, your blood sugar could go low.

## 2014-09-18 NOTE — Patient Instructions (Addendum)
check your blood sugar twice a day: vary the time of day, between before the 3 meals, and at bedtime.  also check if you have symptoms of your blood sugar being too high or too low.  please keep a record of the readings and bring it to your next appointment here.  You can write it on any piece of paper.  please call us sooner if your blood sugar goes below 70, or if you have a lot of readings over 200.   blood and urine tests are being requested for you today.  We'll contact you with results. Please come back for a follow-up appointment in January.   good diet and exercise habits significanly improve the control of your diabetes.  please let me know if you wish to be referred to a dietician.  high blood sugar is very risky to your health.  you should see an eye doctor and dentist every year.  It is very important to get all recommended vaccinations.  On this type of insulin schedule, you should eat meals on a regular schedule.  If a meal is missed or significantly delayed, your blood sugar could go low.

## 2014-09-19 LAB — MICROALBUMIN / CREATININE URINE RATIO
Creatinine, Urine: 341.4 mg/dL
MICROALB UR: 2.6 mg/dL — AB (ref <2.0)
Microalb Creat Ratio: 7.6 mg/g (ref 0.0–30.0)

## 2014-09-21 ENCOUNTER — Other Ambulatory Visit: Payer: Self-pay | Admitting: Family Medicine

## 2014-09-24 ENCOUNTER — Telehealth: Payer: Self-pay | Admitting: Family Medicine

## 2014-09-24 NOTE — Telephone Encounter (Signed)
error 

## 2014-10-10 ENCOUNTER — Telehealth: Payer: Self-pay | Admitting: Family Medicine

## 2014-10-10 NOTE — Telephone Encounter (Signed)
[facility] would like authorization for a pain cream faxed on 10/20 Pt has requested.  Ref no:  [id number]

## 2014-10-15 NOTE — Telephone Encounter (Signed)
This is not something that Dr. Sarajane Jews is going to prescribe. I have already faxed over a form with this information on it.

## 2015-01-17 ENCOUNTER — Encounter: Payer: Self-pay | Admitting: Endocrinology

## 2015-01-17 ENCOUNTER — Ambulatory Visit (INDEPENDENT_AMBULATORY_CARE_PROVIDER_SITE_OTHER): Payer: BC Managed Care – PPO | Admitting: Endocrinology

## 2015-01-17 VITALS — BP 122/76 | HR 72 | Temp 98.0°F | Ht 71.0 in | Wt 232.0 lb

## 2015-01-17 DIAGNOSIS — E1022 Type 1 diabetes mellitus with diabetic chronic kidney disease: Secondary | ICD-10-CM

## 2015-01-17 DIAGNOSIS — N189 Chronic kidney disease, unspecified: Secondary | ICD-10-CM

## 2015-01-17 LAB — HEMOGLOBIN A1C: HEMOGLOBIN A1C: 7.1 % — AB (ref 4.6–6.5)

## 2015-01-17 NOTE — Progress Notes (Signed)
Subjective:    Patient ID: Kenneth [name], male    DOB: [birth year], 59 y.o.   MRN: [id number]  HPI Pt returns for f/u of diabetes mellitus: DM type: 1 Dx'ed: 8101 Complications: renal insuficiency Therapy: insulin since dx DKA: once at the time of dx, and approx 2011 Pancreatitis: never Other: pt says DKA episodes were related to alcoholism; he no longer consumes etoh; in early 2015, he was changed to a simpler qd insulin regimen, due to poor results with multiple daily injections; he works 2nd shift, setting up for events. Interval history: Pt says he never misses the insulin. no cbg record, but states cbg's are well-controlled. pt states he feels well in general. Past Medical History  Diagnosis Date  . Diabetes mellitus   . Alcohol abuse   . Chronic kidney disease   . Hypertension   . Angina   . Hyperlipidemia     No past surgical history on file.  History   Social History  . Marital Status: Single    Spouse Name: N/A    Number of Children: N/A  . Years of Education: N/A   Occupational History  . Not on file.   Social History Main Topics  . Smoking status: Former Smoker -- 0.50 packs/day    Types: Cigarettes    Quit date: 01/30/2012  . Smokeless tobacco: Never Used  . Alcohol Use: No  . Drug Use: No  . Sexual Activity: Not on file   Other Topics Concern  . Not on file   Social History Narrative    Current Outpatient Prescriptions on File Prior to Visit  Medication Sig Dispense Refill  . acetaminophen (TYLENOL) 500 MG tablet Take 1,000 mg by mouth every 6 (six) hours as needed. For pain    . glucose blood (ONE TOUCH ULTRA TEST) test strip 1 each by Other route 4 (four) times daily. And lancets 4/day, 250.01 120 each 12  . Insulin Glargine (LANTUS) 100 UNIT/ML Solostar Pen Inject 120 Units into the skin every morning.    . Lancets MISC 1 each by Does not apply route 3 (three) times daily.      Marland Kitchen losartan (COZAAR) 25 MG tablet TAKE ONE TABLET BY MOUTH  ONCE DAILY 30 tablet 3  . potassium chloride SA (KLOR-CON M20) 20 MEQ tablet Take 1 tablet (20 mEq total) by mouth daily. 30 tablet 3  . simvastatin (ZOCOR) 20 MG tablet TAKE ONE TABLET BY MOUTH ONCE DAILY AT BEDTIME 30 tablet 3  . sildenafil (VIAGRA) 100 MG tablet Take 0.5-1 tablets (50-100 mg total) by mouth daily as needed for erectile dysfunction. 5 tablet 11   No current facility-administered medications on file prior to visit.    No Known Allergies  Family History  Problem Relation Age of Onset  . Diabetes Father   . Hypertension Father   . Alcohol abuse Father   . Diabetes Sister   . Breast cancer Mother   . Colon cancer Neg Hx   . Pancreatic cancer Neg Hx   . Stomach cancer Neg Hx     BP 122/76 mmHg  Pulse 72  Temp(Src) 98 F (36.7 C) (Oral)  Ht 5\' 11"  (1.803 m)  Wt 232 lb (105.235 kg)  BMI 32.37 kg/m2  SpO2 96%    Review of Systems He denies hypoglycemia.  He has gained weight    Objective:   Physical Exam VITAL SIGNS:  See vs page GENERAL: no distress Pulses: dorsalis pedis intact bilat.   MSK:  no deformity of the feet CV: no leg edema Skin:  no ulcer on the feet.  normal color and temp on the feet. Neuro: sensation is intact to touch on the feet   Lab Results  Component Value Date   HGBA1C 7.1* 01/17/2015       Assessment & Plan:  DM: this is the best control this pt should aim for, given this regimen, which does match insulin to his changing needs throughout the day.   Patient is advised the following: Patient Instructions  check your blood sugar twice a day: vary the time of day, between before the 3 meals, and at bedtime.  also check if you have symptoms of your blood sugar being too high or too low.  please keep a record of the readings and bring it to your next appointment here.  You can write it on any piece of paper.  please call us sooner if your blood sugar goes below 70, or if you have a lot of readings over 200.   A diabetes blood test  is requested for you today.  We'll contact you with results. Please come back for a follow-up appointment in 3 months.   On this type of insulin schedule, you should eat meals on a regular schedule.  If a meal is missed or significantly delayed, your blood sugar could go low.   If the blood test says we need to reduce the insulin, doing so will help you lose weight.

## 2015-01-17 NOTE — Patient Instructions (Addendum)
check your blood sugar twice a day: vary the time of day, between before the 3 meals, and at bedtime.  also check if you have symptoms of your blood sugar being too high or too low.  please keep a record of the readings and bring it to your next appointment here.  You can write it on any piece of paper.  please call us sooner if your blood sugar goes below 70, or if you have a lot of readings over 200.   A diabetes blood test is requested for you today.  We'll contact you with results. Please come back for a follow-up appointment in 3 months.   On this type of insulin schedule, you should eat meals on a regular schedule.  If a meal is missed or significantly delayed, your blood sugar could go low.   If the blood test says we need to reduce the insulin, doing so will help you lose weight.

## 2015-01-19 ENCOUNTER — Other Ambulatory Visit: Payer: Self-pay | Admitting: Family Medicine

## 2015-02-18 ENCOUNTER — Other Ambulatory Visit: Payer: Self-pay | Admitting: Endocrinology

## 2015-03-23 ENCOUNTER — Other Ambulatory Visit: Payer: Self-pay | Admitting: Family Medicine

## 2015-04-18 ENCOUNTER — Ambulatory Visit (INDEPENDENT_AMBULATORY_CARE_PROVIDER_SITE_OTHER): Payer: BC Managed Care – PPO | Admitting: Endocrinology

## 2015-04-18 ENCOUNTER — Encounter: Payer: Self-pay | Admitting: Endocrinology

## 2015-04-18 VITALS — BP 136/84 | HR 76 | Temp 98.7°F | Ht 71.0 in | Wt 230.0 lb

## 2015-04-18 DIAGNOSIS — N189 Chronic kidney disease, unspecified: Secondary | ICD-10-CM | POA: Diagnosis not present

## 2015-04-18 DIAGNOSIS — E1022 Type 1 diabetes mellitus with diabetic chronic kidney disease: Secondary | ICD-10-CM

## 2015-04-18 LAB — HEMOGLOBIN A1C: Hgb A1c MFr Bld: 5.6 % (ref 4.6–6.5)

## 2015-04-18 NOTE — Patient Instructions (Addendum)
check your blood sugar twice a day: vary the time of day, between before the 3 meals, and at bedtime.  also check if you have symptoms of your blood sugar being too high or too low.  please keep a record of the readings and bring it to your next appointment here.  You can write it on any piece of paper.  please call us sooner if your blood sugar goes below 70, or if you have a lot of readings over 200.   A diabetes blood test is requested for you today.  We'll contact you with results. Please come back for a follow-up appointment in 4 months.   On this type of insulin schedule, you should eat meals on a regular schedule.  If a meal is missed or significantly delayed, your blood sugar could go low.

## 2015-04-18 NOTE — Progress Notes (Signed)
Subjective:    Patient ID: Kenneth [name], male    DOB: July 13, 1956, 59 y.o.   MRN: [id number]  HPI Pt returns for f/u of diabetes mellitus: DM type: 1 Dx'ed: 2633 Complications: renal insuficiency Therapy: insulin since dx DKA: once at the time of dx, and approx 2011 Pancreatitis: never Other: pt says DKA episodes were related to alcoholism; he no longer consumes EtOH; in early 2015, he was changed to a simpler qd insulin regimen, due to poor results with multiple daily injections; he works 2nd shift, setting up for events. Interval history: Pt says he never misses the insulin. no cbg record, but states cbg's are well-controlled. pt states he feels well in general.  Past Medical History  Diagnosis Date  . Diabetes mellitus   . Alcohol abuse   . Chronic kidney disease   . Hypertension   . Angina   . Hyperlipidemia     No past surgical history on file.  History   Social History  . Marital Status: Single    Spouse Name: N/A  . Number of Children: N/A  . Years of Education: N/A   Occupational History  . Not on file.   Social History Main Topics  . Smoking status: Former Smoker -- 0.50 packs/day    Types: Cigarettes    Quit date: 01/30/2012  . Smokeless tobacco: Never Used  . Alcohol Use: No  . Drug Use: No  . Sexual Activity: Not on file   Other Topics Concern  . Not on file   Social History Narrative    Current Outpatient Prescriptions on File Prior to Visit  Medication Sig Dispense Refill  . acetaminophen (TYLENOL) 500 MG tablet Take 1,000 mg by mouth every 6 (six) hours as needed. For pain    . Insulin Glargine (LANTUS) 100 UNIT/ML Solostar Pen Inject 100 Units into the skin every morning.     . Lancets MISC 1 each by Does not apply route 3 (three) times daily.      Marland Kitchen losartan (COZAAR) 25 MG tablet TAKE ONE TABLET BY MOUTH ONCE DAILY 30 tablet 0  . potassium chloride SA (KLOR-CON M20) 20 MEQ tablet Take 1 tablet (20 mEq total) by mouth daily. 30 tablet 3    . simvastatin (ZOCOR) 20 MG tablet TAKE ONE TABLET BY MOUTH AT BEDTIME 30 tablet 0  . UNISTRIP1 GENERIC test strip TEST BLOOD SUGARS 4 TIMES DAILY 100 each 8  . sildenafil (VIAGRA) 100 MG tablet Take 0.5-1 tablets (50-100 mg total) by mouth daily as needed for erectile dysfunction. 5 tablet 11   No current facility-administered medications on file prior to visit.    No Known Allergies  Family History  Problem Relation Age of Onset  . Diabetes Father   . Hypertension Father   . Alcohol abuse Father   . Diabetes Sister   . Breast cancer Mother   . Colon cancer Neg Hx   . Pancreatic cancer Neg Hx   . Stomach cancer Neg Hx     BP 136/84 mmHg  Pulse 76  Temp(Src) 98.7 F (37.1 C) (Oral)  Ht 5\' 11"  (1.803 m)  Wt 230 lb (104.327 kg)  BMI 32.09 kg/m2  SpO2 97%    Review of Systems He has lost a few lbs. Denies numbness.    Objective:   Physical Exam VITAL SIGNS:  See vs page GENERAL: no distress Pulses: dorsalis pedis intact bilat.   MSK: no deformity of the feet CV: no leg edema Skin:  no ulcer on the feet.  normal color and temp on the feet. Neuro: sensation is intact to touch on the feet Ext: There is bilateral onychomycosis of the toenails   Lab Results  Component Value Date   HGBA1C 5.6 04/18/2015      Assessment & Plan:  DM: overcontrolled Obesity: pt is advised to continue  Patient is advised the following: Patient Instructions  check your blood sugar twice a day: vary the time of day, between before the 3 meals, and at bedtime.  also check if you have symptoms of your blood sugar being too high or too low.  please keep a record of the readings and bring it to your next appointment here.  You can write it on any piece of paper.  please call us sooner if your blood sugar goes below 70, or if you have a lot of readings over 200.   A diabetes blood test is requested for you today.  We'll contact you with results. Please come back for a follow-up appointment  in 4 months.   On this type of insulin schedule, you should eat meals on a regular schedule.  If a meal is missed or significantly delayed, your blood sugar could go low.     addendum: Please reduce the insulin to 100 units qam.

## 2015-04-26 ENCOUNTER — Other Ambulatory Visit: Payer: Self-pay | Admitting: Family Medicine

## 2015-05-20 ENCOUNTER — Ambulatory Visit: Payer: BC Managed Care – PPO | Admitting: Family Medicine

## 2015-05-21 ENCOUNTER — Other Ambulatory Visit: Payer: Self-pay | Admitting: Family Medicine

## 2015-05-23 ENCOUNTER — Encounter: Payer: Self-pay | Admitting: Family Medicine

## 2015-05-23 ENCOUNTER — Ambulatory Visit (INDEPENDENT_AMBULATORY_CARE_PROVIDER_SITE_OTHER): Payer: BC Managed Care – PPO | Admitting: Family Medicine

## 2015-05-23 VITALS — BP 120/68 | HR 67 | Temp 98.7°F | Ht 71.0 in | Wt 225.0 lb

## 2015-05-23 DIAGNOSIS — I10 Essential (primary) hypertension: Secondary | ICD-10-CM | POA: Diagnosis not present

## 2015-05-23 DIAGNOSIS — E785 Hyperlipidemia, unspecified: Secondary | ICD-10-CM | POA: Diagnosis not present

## 2015-05-23 DIAGNOSIS — N401 Enlarged prostate with lower urinary tract symptoms: Secondary | ICD-10-CM | POA: Diagnosis not present

## 2015-05-23 DIAGNOSIS — N138 Other obstructive and reflux uropathy: Secondary | ICD-10-CM

## 2015-05-23 LAB — CBC WITH DIFFERENTIAL/PLATELET
Basophils Absolute: 0 10*3/uL (ref 0.0–0.1)
Basophils Relative: 0.5 % (ref 0.0–3.0)
Eosinophils Absolute: 0.1 10*3/uL (ref 0.0–0.7)
Eosinophils Relative: 1.2 % (ref 0.0–5.0)
HCT: 38.7 % — ABNORMAL LOW (ref 39.0–52.0)
Hemoglobin: 13.1 g/dL (ref 13.0–17.0)
Lymphocytes Relative: 30.1 % (ref 12.0–46.0)
Lymphs Abs: 1.8 10*3/uL (ref 0.7–4.0)
MCHC: 33.8 g/dL (ref 30.0–36.0)
MCV: 92.1 fl (ref 78.0–100.0)
Monocytes Absolute: 0.5 10*3/uL (ref 0.1–1.0)
Monocytes Relative: 8.8 % (ref 3.0–12.0)
NEUTROS ABS: 3.5 10*3/uL (ref 1.4–7.7)
NEUTROS PCT: 59.4 % (ref 43.0–77.0)
Platelets: 276 10*3/uL (ref 150.0–400.0)
RBC: 4.21 Mil/uL — ABNORMAL LOW (ref 4.22–5.81)
RDW: 13.5 % (ref 11.5–15.5)
WBC: 5.8 10*3/uL (ref 4.0–10.5)

## 2015-05-23 LAB — LIPID PANEL
CHOL/HDL RATIO: 3
Cholesterol: 146 mg/dL (ref 0–200)
HDL: 41.8 mg/dL (ref >39.00)
LDL CALC: 91 mg/dL (ref 0–99)
NONHDL: 104.2
Triglycerides: 65 mg/dL (ref 0.0–149.0)
VLDL: 13 mg/dL (ref 0.0–40.0)

## 2015-05-23 LAB — POCT URINALYSIS DIPSTICK
Bilirubin, UA: NEGATIVE
Blood, UA: NEGATIVE
Glucose, UA: NEGATIVE
KETONES UA: NEGATIVE
Leukocytes, UA: NEGATIVE
Nitrite, UA: NEGATIVE
Spec Grav, UA: 1.025
Urobilinogen, UA: 1
pH, UA: 5.5

## 2015-05-23 LAB — BASIC METABOLIC PANEL
BUN: 12 mg/dL (ref 6–23)
CHLORIDE: 104 meq/L (ref 96–112)
CO2: 32 mEq/L (ref 19–32)
Calcium: 9.4 mg/dL (ref 8.4–10.5)
Creatinine, Ser: 1.02 mg/dL (ref 0.40–1.50)
GFR: 97 mL/min (ref >60.00)
Glucose, Bld: 60 mg/dL — ABNORMAL LOW (ref 70–99)
Potassium: 4.7 mEq/L (ref 3.5–5.1)
Sodium: 138 mEq/L (ref 135–145)

## 2015-05-23 LAB — HEPATIC FUNCTION PANEL
ALT: 18 U/L (ref 0–53)
AST: 16 U/L (ref 0–37)
Albumin: 4.1 g/dL (ref 3.5–5.2)
Alkaline Phosphatase: 103 U/L (ref 39–117)
BILIRUBIN TOTAL: 0.6 mg/dL (ref 0.2–1.2)
Bilirubin, Direct: 0.1 mg/dL (ref 0.0–0.3)
Total Protein: 7.3 g/dL (ref 6.0–8.3)

## 2015-05-23 LAB — PSA: PSA: 2 ng/mL (ref 0.10–4.00)

## 2015-05-23 LAB — TSH: TSH: 1.44 u[IU]/mL (ref 0.35–4.50)

## 2015-05-23 MED ORDER — LOSARTAN POTASSIUM 25 MG PO TABS: 25.0000 mg | ORAL_TABLET | Freq: Every day | ORAL | Status: DC

## 2015-05-23 MED ORDER — SIMVASTATIN 20 MG PO TABS: 20.0000 mg | ORAL_TABLET | Freq: Every day | ORAL | Status: DC

## 2015-05-23 NOTE — Progress Notes (Signed)
Pre visit review using our clinic review tool, if applicable. No additional management support is needed unless otherwise documented below in the visit note. 

## 2015-05-23 NOTE — Progress Notes (Signed)
   Subjective:    Patient ID: Kenneth [name], male    DOB: [birth year], 59 y.o.   MRN: [id number]  HPI Here to follow up after a year. He feels well and has no concerns. He sees Dr. Loanne Drilling frequently and his diabetes has been well controlled. His BP is stable. He watches is diet closely and he has lost a fair amount of weight.    Review of Systems  Constitutional: Negative.   Respiratory: Negative.   Cardiovascular: Negative.        Objective:   Physical Exam  Constitutional: He appears well-developed and well-nourished.  Cardiovascular: Normal rate, regular rhythm, normal heart sounds and intact distal pulses.   Pulmonary/Chest: Effort normal and breath sounds normal.          Assessment & Plan:  His HTN is stable. We will get fasting labs to day to check his lipids among other things.

## 2015-07-18 ENCOUNTER — Other Ambulatory Visit: Payer: Self-pay | Admitting: Endocrinology

## 2015-07-21 ENCOUNTER — Telehealth: Payer: Self-pay | Admitting: Family Medicine

## 2015-07-21 NOTE — Telephone Encounter (Signed)
Pt request refill Insulin Glargine (LANTUS) 100 UNIT/ML Solostar Pen  [facility] [address] Pt is out of this med

## 2015-07-21 NOTE — Telephone Encounter (Signed)
Script was sent in today by endocrinology.

## 2015-08-20 ENCOUNTER — Ambulatory Visit: Payer: BC Managed Care – PPO | Admitting: Endocrinology

## 2015-09-20 ENCOUNTER — Other Ambulatory Visit: Payer: Self-pay | Admitting: Endocrinology

## 2015-09-22 ENCOUNTER — Telehealth: Payer: Self-pay | Admitting: Endocrinology

## 2015-09-22 NOTE — Telephone Encounter (Signed)
[facility]

## 2015-09-22 NOTE — Telephone Encounter (Signed)
Patient need a refill of Lantus, send to [facility] on [address]

## 2015-09-22 NOTE — Telephone Encounter (Signed)
Rx has been submitted per pt's request.

## 2016-01-18 ENCOUNTER — Other Ambulatory Visit: Payer: Self-pay | Admitting: Endocrinology

## 2016-01-19 ENCOUNTER — Telehealth: Payer: Self-pay

## 2016-01-19 MED ORDER — INSULIN GLARGINE 100 UNIT/ML SOLOSTAR PEN: 130.0000 [IU] | PEN_INJECTOR | Freq: Every day | SUBCUTANEOUS | Status: DC

## 2016-01-19 NOTE — Telephone Encounter (Signed)
Received notice the pt's Lantus is no longer covered. Alternative is [employer], [employer]. Last office visit was 04/18/2015. Please advise if ok to change medication.

## 2016-01-19 NOTE — Telephone Encounter (Signed)
Rx sent. Pt notified of med changed and to call back and schedule office visit via voicemail.

## 2016-01-19 NOTE — Telephone Encounter (Signed)
basaglar is [place]. Please refill x 1. Ov is due

## 2016-01-27 ENCOUNTER — Telehealth: Payer: Self-pay | Admitting: Family Medicine

## 2016-01-27 ENCOUNTER — Encounter: Payer: Self-pay | Admitting: Family Medicine

## 2016-01-27 DIAGNOSIS — M25562 Pain in left knee: Secondary | ICD-10-CM

## 2016-01-27 DIAGNOSIS — M25561 Pain in right knee: Secondary | ICD-10-CM | POA: Insufficient documentation

## 2016-01-27 NOTE — Telephone Encounter (Signed)
Pt would like to try cream for pain in the knees. We received a fax from [facility] and it was filled out and faxed back. I did speak with pt about this.

## 2016-02-02 ENCOUNTER — Telehealth: Payer: Self-pay | Admitting: Family Medicine

## 2016-02-02 NOTE — Telephone Encounter (Signed)
[facility] called to ask for verbal orders for testing strips for pt. Advised pharm to fax over. They said they would. Pt has never ordered from them before.

## 2016-02-03 NOTE — Telephone Encounter (Signed)
I spoke with pt and he does not want to order.

## 2016-02-18 ENCOUNTER — Encounter: Payer: Self-pay | Admitting: Endocrinology

## 2016-02-18 ENCOUNTER — Ambulatory Visit (INDEPENDENT_AMBULATORY_CARE_PROVIDER_SITE_OTHER): Payer: BC Managed Care – PPO | Admitting: Endocrinology

## 2016-02-18 VITALS — BP 132/84 | HR 73 | Temp 97.5°F | Ht 71.0 in | Wt 230.0 lb

## 2016-02-18 DIAGNOSIS — E1022 Type 1 diabetes mellitus with diabetic chronic kidney disease: Secondary | ICD-10-CM | POA: Diagnosis not present

## 2016-02-18 DIAGNOSIS — N189 Chronic kidney disease, unspecified: Secondary | ICD-10-CM

## 2016-02-18 LAB — POCT GLYCOSYLATED HEMOGLOBIN (HGB A1C): Hemoglobin A1C: 5.8

## 2016-02-18 LAB — MICROALBUMIN / CREATININE URINE RATIO
CREATININE, U: 200.9 mg/dL
MICROALB/CREAT RATIO: 1.4 mg/g (ref 0.0–30.0)
Microalb, Ur: 2.8 mg/dL — ABNORMAL HIGH (ref 0.0–1.9)

## 2016-02-18 MED ORDER — INSULIN GLARGINE 100 UNIT/ML SOLOSTAR PEN: 90.0000 [IU] | PEN_INJECTOR | SUBCUTANEOUS | Status: DC

## 2016-02-18 NOTE — Patient Instructions (Addendum)
check your blood sugar twice a day: vary the time of day, between before the 3 meals, and at bedtime.  also check if you have symptoms of your blood sugar being too high or too low.  please keep a record of the readings and bring it to your next appointment here.  You can write it on any piece of paper.  please call us sooner if your blood sugar goes below 70, or if you have a lot of readings over 200.   A diabetes urine test is requested for you today.  We'll let you know about the results. Please reduce the insulin to 90 units daily.  Please come back for a follow-up appointment in 3 months.   On this type of insulin schedule, you should eat meals on a regular schedule.  If a meal is missed or significantly delayed, your blood sugar could go low.

## 2016-02-18 NOTE — Progress Notes (Signed)
Subjective:    Patient ID: Kenneth [name], male    DOB: [birth year], 58 y.o.   MRN: [id number]  HPI Pt returns for f/u of diabetes mellitus: DM type: 1 Dx'ed: [id number] Complications: renal insuficiency Therapy: insulin since dx DKA: twice: at the time of dx, and approx 2011 Pancreatitis: never Other: pt says DKA episodes were related to alcoholism; he no longer consumes EtOH; in early 2015, he was changed to a simpler qd insulin regimen, due to poor results with multiple daily injections; he works 2nd shift, setting up for events. Interval history: Pt says he never misses the insulin. no cbg record, but states cbg's are well-controlled. pt states he feels well in general. He takes 100 units qd.   Past Medical History  Diagnosis Date  . Diabetes mellitus   . Alcohol abuse   . Chronic kidney disease   . Hypertension   . Angina   . Hyperlipidemia     No past surgical history on file.  Social History   Social History  . Marital Status: Single    Spouse Name: N/A  . Number of Children: N/A  . Years of Education: N/A   Occupational History  . Not on file.   Social History Main Topics  . Smoking status: Former Smoker -- 0.50 packs/day    Types: Cigarettes    Quit date: 01/30/2012  . Smokeless tobacco: Never Used  . Alcohol Use: 0.0 oz/week    0 Standard drinks or equivalent per week     Comment: rare  . Drug Use: No  . Sexual Activity: Not on file   Other Topics Concern  . Not on file   Social History Narrative    Current Outpatient Prescriptions on File Prior to Visit  Medication Sig Dispense Refill  . acetaminophen (TYLENOL) 500 MG tablet Take 1,000 mg by mouth every 6 (six) hours as needed. For pain    . Lancets MISC 1 each by Does not apply route 3 (three) times daily.      Marland Kitchen losartan (COZAAR) 25 MG tablet Take 1 tablet (25 mg total) by mouth daily. 30 tablet 11  . NONFORMULARY OR COMPOUNDED ITEM Neurocream- Ketoprofen 5%-Lidocaine 2%-Gabapentin 3% Apply 1  gram to affected areas 4 times daily with quantity of 120 grams and 11 refills.    . potassium chloride SA (KLOR-CON M20) 20 MEQ tablet Take 1 tablet (20 mEq total) by mouth daily. 30 tablet 3  . simvastatin (ZOCOR) 20 MG tablet Take 1 tablet (20 mg total) by mouth at bedtime. 30 tablet 11  . UNISTRIP1 GENERIC test strip TEST BLOOD SUGARS 4 TIMES DAILY 100 each 8  . sildenafil (VIAGRA) 100 MG tablet Take 0.5-1 tablets (50-100 mg total) by mouth daily as needed for erectile dysfunction. 5 tablet 11   No current facility-administered medications on file prior to visit.    No Known Allergies  Family History  Problem Relation Age of Onset  . Diabetes Father   . Hypertension Father   . Alcohol abuse Father   . Diabetes Sister   . Breast cancer Mother   . Colon cancer Neg Hx   . Pancreatic cancer Neg Hx   . Stomach cancer Neg Hx     BP 132/84 mmHg  Pulse 73  Temp(Src) 97.5 F (36.4 C) (Oral)  Ht 5\' 11"  (1.803 m)  Wt 230 lb (104.327 kg)  BMI 32.09 kg/m2  SpO2 97%  Review of Systems Denies LOC    Objective:  Physical Exam VITAL SIGNS:  See vs page GENERAL: no distress Pulses: dorsalis pedis intact bilat.   MSK: no deformity of the feet CV: no leg edema Skin:  no ulcer on the feet.  normal color and temp on the feet. Neuro: sensation is intact to touch on the feet Ext: There is bilateral onychomycosis of the toenails   Lab Results  Component Value Date   HGBA1C 5.8 02/18/2016      Assessment & Plan:  DM: overcontrolled, given this regimen, which does match insulin to his changing needs throughout the day  Patient is advised the following: Patient Instructions  check your blood sugar twice a day: vary the time of day, between before the 3 meals, and at bedtime.  also check if you have symptoms of your blood sugar being too high or too low.  please keep a record of the readings and bring it to your next appointment here.  You can write it on any piece of paper.  please  call us sooner if your blood sugar goes below 70, or if you have a lot of readings over 200.   A diabetes urine test is requested for you today.  We'll let you know about the results. Please reduce the insulin to 90 units daily.  Please come back for a follow-up appointment in 3 months.   On this type of insulin schedule, you should eat meals on a regular schedule.  If a meal is missed or significantly delayed, your blood sugar could go low.

## 2016-05-19 ENCOUNTER — Encounter: Payer: Self-pay | Admitting: Endocrinology

## 2016-05-19 ENCOUNTER — Ambulatory Visit (INDEPENDENT_AMBULATORY_CARE_PROVIDER_SITE_OTHER): Payer: BC Managed Care – PPO | Admitting: Endocrinology

## 2016-05-19 VITALS — BP 122/70 | HR 76 | Temp 98.2°F | Ht 71.0 in | Wt 225.0 lb

## 2016-05-19 DIAGNOSIS — N189 Chronic kidney disease, unspecified: Secondary | ICD-10-CM | POA: Diagnosis not present

## 2016-05-19 DIAGNOSIS — E1022 Type 1 diabetes mellitus with diabetic chronic kidney disease: Secondary | ICD-10-CM

## 2016-05-19 LAB — POCT GLYCOSYLATED HEMOGLOBIN (HGB A1C): Hemoglobin A1C: 5.5

## 2016-05-19 MED ORDER — INSULIN GLARGINE 100 UNIT/ML SOLOSTAR PEN: 80.0000 [IU] | PEN_INJECTOR | SUBCUTANEOUS | Status: DC

## 2016-05-19 NOTE — Patient Instructions (Addendum)
check your blood sugar twice a day: vary the time of day, between before the 3 meals, and at bedtime.  also check if you have symptoms of your blood sugar being too high or too low.  please keep a record of the readings and bring it to your next appointment here.  You can write it on any piece of paper.  please call us sooner if your blood sugar goes below 70, or if you have a lot of readings over 200.   Please reduce the insulin to 80 units daily.  Please come back for a follow-up appointment in 3 months.   On this type of insulin schedule, you should eat meals on a regular schedule.  If a meal is missed or significantly delayed, your blood sugar could go low.

## 2016-05-19 NOTE — Progress Notes (Signed)
Subjective:    Patient ID: Kenneth [name], male    DOB: 05-Sep-1956, 60 y.o.   MRN: [id number]  HPI Pt returns for f/u of diabetes mellitus: DM type: 1 Dx'ed: [id number] Complications: renal insufficiency.  Therapy: insulin since dx DKA: twice: at the time of dx, and approx 2011.  Pancreatitis: never Other: pt says DKA episodes were related to alcoholism; he no longer consumes EtOH; in early 2015, he was changed to a qd insulin regimen, due to poor results with multiple daily injections; he works 2nd shift, setting up for events. Interval history: Pt says he never misses the insulin. no cbg record, but states cbg's are well-controlled.  He seldom has hypoglycemia, and these episodes are mild.  pt states he feels well in general. He takes 90 units qd.   Past Medical History  Diagnosis Date  . Diabetes mellitus   . Alcohol abuse   . Chronic kidney disease   . Hypertension   . Angina   . Hyperlipidemia     No past surgical history on file.  Social History   Social History  . Marital Status: Single    Spouse Name: N/A  . Number of Children: N/A  . Years of Education: N/A   Occupational History  . Not on file.   Social History Main Topics  . Smoking status: Former Smoker -- 0.50 packs/day    Types: Cigarettes    Quit date: 01/30/2012  . Smokeless tobacco: Never Used  . Alcohol Use: 0.0 oz/week    0 Standard drinks or equivalent per week     Comment: rare  . Drug Use: No  . Sexual Activity: Not on file   Other Topics Concern  . Not on file   Social History Narrative    Current Outpatient Prescriptions on File Prior to Visit  Medication Sig Dispense Refill  . acetaminophen (TYLENOL) 500 MG tablet Take 1,000 mg by mouth every 6 (six) hours as needed. For pain    . Lancets MISC 1 each by Does not apply route 3 (three) times daily.      Marland Kitchen losartan (COZAAR) 25 MG tablet Take 1 tablet (25 mg total) by mouth daily. 30 tablet 11  . NONFORMULARY OR COMPOUNDED ITEM  Neurocream- Ketoprofen 5%-Lidocaine 2%-Gabapentin 3% Apply 1 gram to affected areas 4 times daily with quantity of 120 grams and 11 refills.    . potassium chloride SA (KLOR-CON M20) 20 MEQ tablet Take 1 tablet (20 mEq total) by mouth daily. 30 tablet 3  . simvastatin (ZOCOR) 20 MG tablet Take 1 tablet (20 mg total) by mouth at bedtime. 30 tablet 11  . UNISTRIP1 GENERIC test strip TEST BLOOD SUGARS 4 TIMES DAILY 100 each 8  . sildenafil (VIAGRA) 100 MG tablet Take 0.5-1 tablets (50-100 mg total) by mouth daily as needed for erectile dysfunction. 5 tablet 11   No current facility-administered medications on file prior to visit.    No Known Allergies  Family History  Problem Relation Age of Onset  . Diabetes Father   . Hypertension Father   . Alcohol abuse Father   . Diabetes Sister   . Breast cancer Mother   . Colon cancer Neg Hx   . Pancreatic cancer Neg Hx   . Stomach cancer Neg Hx     BP 122/70 mmHg  Pulse 76  Temp(Src) 98.2 F (36.8 C) (Oral)  Ht 5\' 11"  (1.803 m)  Wt 225 lb (102.059 kg)  BMI 31.39 kg/m2  SpO2 95%  Review of Systems He has lost a few lbs.      Objective:   Physical Exam VITAL SIGNS:  See vs page GENERAL: no distress Pulses: dorsalis pedis intact bilat.   MSK: no deformity of the feet CV: no leg edema Skin:  no ulcer on the feet.  normal color and temp on the feet. Neuro: sensation is intact to touch on the feet. Ext: There is bilateral onychomycosis of the toenails  A1c=5.5%     Assessment & Plan:  DM: overcontrolled  Patient is advised the following: Patient Instructions  check your blood sugar twice a day: vary the time of day, between before the 3 meals, and at bedtime.  also check if you have symptoms of your blood sugar being too high or too low.  please keep a record of the readings and bring it to your next appointment here.  You can write it on any piece of paper.  please call us sooner if your blood sugar goes below 70, or if you have  a lot of readings over 200.   Please reduce the insulin to 80 units daily.  Please come back for a follow-up appointment in 3 months.   On this type of insulin schedule, you should eat meals on a regular schedule.  If a meal is missed or significantly delayed, your blood sugar could go low.     Renato Shin, MD

## 2016-06-21 ENCOUNTER — Telehealth: Payer: Self-pay | Admitting: Family Medicine

## 2016-06-21 ENCOUNTER — Other Ambulatory Visit: Payer: Self-pay | Admitting: Family Medicine

## 2016-06-21 NOTE — Telephone Encounter (Signed)
Both scripts were sent e-scribe. 

## 2016-06-21 NOTE — Telephone Encounter (Signed)
Pt needs a refill on losartan and simvastatin #90 w/refills send to [facility]

## 2016-07-21 ENCOUNTER — Telehealth: Payer: Self-pay | Admitting: Family Medicine

## 2016-07-21 NOTE — Telephone Encounter (Signed)
Pt following up on refill request. Informed pt he needs appointment for future refills.  Pt has made appointment for Tuesday, August 8. Can we send 30 days into  [facility] [address]?  losartan (COZAAR) 25 MG tablet

## 2016-07-21 NOTE — Telephone Encounter (Signed)
I sent both scripts e-scribe and spoke with pt. 

## 2016-07-27 ENCOUNTER — Encounter: Payer: Self-pay | Admitting: Family Medicine

## 2016-07-27 ENCOUNTER — Ambulatory Visit (INDEPENDENT_AMBULATORY_CARE_PROVIDER_SITE_OTHER): Payer: BC Managed Care – PPO | Admitting: Family Medicine

## 2016-07-27 VITALS — BP 122/72 | HR 66 | Temp 98.4°F | Ht 71.0 in | Wt 227.0 lb

## 2016-07-27 DIAGNOSIS — E1022 Type 1 diabetes mellitus with diabetic chronic kidney disease: Secondary | ICD-10-CM

## 2016-07-27 DIAGNOSIS — I10 Essential (primary) hypertension: Secondary | ICD-10-CM | POA: Diagnosis not present

## 2016-07-27 DIAGNOSIS — E785 Hyperlipidemia, unspecified: Secondary | ICD-10-CM

## 2016-07-27 DIAGNOSIS — N401 Enlarged prostate with lower urinary tract symptoms: Secondary | ICD-10-CM

## 2016-07-27 DIAGNOSIS — N138 Other obstructive and reflux uropathy: Secondary | ICD-10-CM

## 2016-07-27 DIAGNOSIS — M25561 Pain in right knee: Secondary | ICD-10-CM

## 2016-07-27 DIAGNOSIS — M25562 Pain in left knee: Secondary | ICD-10-CM

## 2016-07-27 DIAGNOSIS — N189 Chronic kidney disease, unspecified: Secondary | ICD-10-CM

## 2016-07-27 MED ORDER — BASAGLAR KWIKPEN 100 UNIT/ML ~~LOC~~ SOPN: 80.0000 [IU] | PEN_INJECTOR | Freq: Every day | SUBCUTANEOUS | 0 refills | Status: DC

## 2016-07-27 NOTE — Progress Notes (Signed)
Pre visit review using our clinic review tool, if applicable. No additional management support is needed unless otherwise documented below in the visit note. 

## 2016-07-27 NOTE — Progress Notes (Signed)
   Subjective:    Patient ID: Kenneth [name], male    DOB: 12/22/1955, 60 y.o.   MRN: [id number]  HPI Here to follow up on several issues. He sees Dr. Loanne Drilling regularly for diabetes management, and he has been doing very well with this. His last A1c in May was 5.5. His only complaint today is knee pain, the right being worse than the left. He takes Tylenol with minimal relief.    Review of Systems  Constitutional: Negative.   Respiratory: Negative.   Cardiovascular: Negative.   Gastrointestinal: Negative.   Musculoskeletal: Positive for arthralgias.  Neurological: Negative.        Objective:   Physical Exam  Constitutional: He is oriented to person, place, and time. He appears well-developed and well-nourished.  Cardiovascular: Normal rate, regular rhythm, normal heart sounds and intact distal pulses.   Pulmonary/Chest: Effort normal and breath sounds normal.  Musculoskeletal: He exhibits no edema.  The right knee has some crepitus and is tender along the medial joint space. Full ROM  Neurological: He is alert and oriented to person, place, and time.          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 His DM is stable. His HTN is stable. We will set him up for fasting labs soon. He is past due for an eye exam.  Laurey Morale, MD

## 2016-07-27 NOTE — Addendum Note (Signed)
Addended by: Alysia Penna A on: 07/27/2016 09:06 AM   Modules accepted: Orders

## 2016-07-28 ENCOUNTER — Other Ambulatory Visit: Payer: BC Managed Care – PPO

## 2016-08-02 ENCOUNTER — Other Ambulatory Visit (INDEPENDENT_AMBULATORY_CARE_PROVIDER_SITE_OTHER): Payer: BC Managed Care – PPO

## 2016-08-02 DIAGNOSIS — N401 Enlarged prostate with lower urinary tract symptoms: Secondary | ICD-10-CM

## 2016-08-02 DIAGNOSIS — I10 Essential (primary) hypertension: Secondary | ICD-10-CM | POA: Diagnosis not present

## 2016-08-02 DIAGNOSIS — N138 Other obstructive and reflux uropathy: Secondary | ICD-10-CM

## 2016-08-02 LAB — HEPATIC FUNCTION PANEL
ALT: 18 U/L (ref 0–53)
AST: 10 U/L (ref 0–37)
Albumin: 4 g/dL (ref 3.5–5.2)
Alkaline Phosphatase: 76 U/L (ref 39–117)
BILIRUBIN TOTAL: 0.4 mg/dL (ref 0.2–1.2)
Bilirubin, Direct: 0.1 mg/dL (ref 0.0–0.3)
Total Protein: 6.9 g/dL (ref 6.0–8.3)

## 2016-08-02 LAB — POC URINALSYSI DIPSTICK (AUTOMATED)
Bilirubin, UA: NEGATIVE
Blood, UA: NEGATIVE
Glucose, UA: NEGATIVE
KETONES UA: NEGATIVE
LEUKOCYTES UA: NEGATIVE
Nitrite, UA: NEGATIVE
PROTEIN UA: NEGATIVE
Spec Grav, UA: 1.02
Urobilinogen, UA: 0.2
pH, UA: 5.5

## 2016-08-02 LAB — PSA: PSA: 2.02 ng/mL (ref 0.10–4.00)

## 2016-08-02 LAB — CBC WITH DIFFERENTIAL/PLATELET
Basophils Absolute: 0 10*3/uL (ref 0.0–0.1)
Basophils Relative: 0.3 % (ref 0.0–3.0)
EOS PCT: 1.8 % (ref 0.0–5.0)
Eosinophils Absolute: 0.1 10*3/uL (ref 0.0–0.7)
HCT: 37.4 % — ABNORMAL LOW (ref 39.0–52.0)
HEMOGLOBIN: 12.8 g/dL — AB (ref 13.0–17.0)
Lymphocytes Relative: 38.3 % (ref 12.0–46.0)
Lymphs Abs: 2.3 10*3/uL (ref 0.7–4.0)
MCHC: 34.2 g/dL (ref 30.0–36.0)
MCV: 90.8 fl (ref 78.0–100.0)
MONO ABS: 0.5 10*3/uL (ref 0.1–1.0)
MONOS PCT: 8.4 % (ref 3.0–12.0)
Neutro Abs: 3.1 10*3/uL (ref 1.4–7.7)
Neutrophils Relative %: 51.2 % (ref 43.0–77.0)
Platelets: 240 10*3/uL (ref 150.0–400.0)
RBC: 4.12 Mil/uL — AB (ref 4.22–5.81)
RDW: 13.2 % (ref 11.5–15.5)
WBC: 6.1 10*3/uL (ref 4.0–10.5)

## 2016-08-02 LAB — LIPID PANEL
CHOL/HDL RATIO: 3
CHOLESTEROL: 111 mg/dL (ref 0–200)
HDL: 38.8 mg/dL — AB (ref >39.00)
LDL CALC: 61 mg/dL (ref 0–99)
NonHDL: 72.45
TRIGLYCERIDES: 55 mg/dL (ref 0.0–149.0)
VLDL: 11 mg/dL (ref 0.0–40.0)

## 2016-08-02 LAB — BASIC METABOLIC PANEL
BUN: 15 mg/dL (ref 6–23)
CHLORIDE: 105 meq/L (ref 96–112)
CO2: 28 mEq/L (ref 19–32)
CREATININE: 0.98 mg/dL (ref 0.40–1.50)
Calcium: 9.2 mg/dL (ref 8.4–10.5)
GFR: 101.15 mL/min (ref >60.00)
GLUCOSE: 85 mg/dL (ref 70–99)
Potassium: 4.2 mEq/L (ref 3.5–5.1)
Sodium: 138 mEq/L (ref 135–145)

## 2016-08-02 LAB — HEMOGLOBIN A1C: HEMOGLOBIN A1C: 5.4 % (ref 4.6–6.5)

## 2016-08-02 LAB — MICROALBUMIN / CREATININE URINE RATIO
CREATININE, U: 205.2 mg/dL
MICROALB UR: 1.8 mg/dL (ref 0.0–1.9)
MICROALB/CREAT RATIO: 0.9 mg/g (ref 0.0–30.0)

## 2016-08-02 LAB — TSH: TSH: 1.83 u[IU]/mL (ref 0.35–4.50)

## 2016-08-18 ENCOUNTER — Other Ambulatory Visit: Payer: Self-pay | Admitting: Family Medicine

## 2016-08-19 ENCOUNTER — Ambulatory Visit (INDEPENDENT_AMBULATORY_CARE_PROVIDER_SITE_OTHER): Payer: BC Managed Care – PPO | Admitting: Endocrinology

## 2016-08-19 ENCOUNTER — Encounter: Payer: Self-pay | Admitting: Endocrinology

## 2016-08-19 VITALS — BP 110/60 | HR 54 | Ht 71.5 in | Wt 226.0 lb

## 2016-08-19 DIAGNOSIS — N189 Chronic kidney disease, unspecified: Secondary | ICD-10-CM

## 2016-08-19 DIAGNOSIS — E1022 Type 1 diabetes mellitus with diabetic chronic kidney disease: Secondary | ICD-10-CM

## 2016-08-19 MED ORDER — BASAGLAR KWIKPEN 100 UNIT/ML ~~LOC~~ SOPN: 60.0000 [IU] | PEN_INJECTOR | SUBCUTANEOUS | 11 refills | Status: DC

## 2016-08-19 NOTE — Patient Instructions (Addendum)
check your blood sugar twice a day: vary the time of day, between before the 3 meals, and at bedtime.  also check if you have symptoms of your blood sugar being too high or too low.  please keep a record of the readings and bring it to your next appointment here.  You can write it on any piece of paper.  please call us sooner if your blood sugar goes below 70, or if you have a lot of readings over 200.   Please reduce the insulin to 60 units daily.  Please come back for a follow-up appointment in 3 months.   On this type of insulin schedule, you should eat meals on a regular schedule.  If a meal is missed or significantly delayed, your blood sugar could go low.

## 2016-08-19 NOTE — Telephone Encounter (Signed)
Rx refill sent to pharmacy. 

## 2016-08-19 NOTE — Progress Notes (Signed)
Subjective:    Patient ID: Kenneth [name], male    DOB: 08/08/1956, 60 y.o.   MRN: [id number]  HPI Pt returns for f/u of diabetes mellitus: DM type: 1 Dx'ed: [id number] Complications: renal insufficiency.  Therapy: insulin since dx DKA: twice: at the time of dx, and approx 2011.  Pancreatitis: never.   Other: pt says DKA episodes were related to alcoholism; he no longer consumes EtOH; in early 2015, he was changed to a qd insulin regimen, due to poor results with multiple daily injections; he works 2nd shift, setting up for events. Interval history: Pt says he never misses the insulin. no cbg record, but states cbg's are well-controlled.  He seldom has hypoglycemia, and these episodes are mild.  pt states he feels well in general. He takes 80 units qd.   Past Medical History:  Diagnosis Date  . Alcohol abuse   . Angina   . Chronic kidney disease   . Diabetes mellitus   . Hyperlipidemia   . Hypertension     No past surgical history on file.  Social History   Social History  . Marital status: Single    Spouse name: N/A  . Number of children: N/A  . Years of education: N/A   Occupational History  . Not on file.   Social History Main Topics  . Smoking status: Former Smoker    Packs/day: 0.50    Types: Cigarettes    Quit date: 01/30/2012  . Smokeless tobacco: Never Used  . Alcohol use 0.0 oz/week     Comment: rare  . Drug use: No  . Sexual activity: Not on file   Other Topics Concern  . Not on file   Social History Narrative  . No narrative on file    Current Outpatient Prescriptions on File Prior to Visit  Medication Sig Dispense Refill  . acetaminophen (TYLENOL) 500 MG tablet Take 1,000 mg by mouth every 6 (six) hours as needed. For pain    . Lancets MISC 1 each by Does not apply route 3 (three) times daily.      Marland Kitchen losartan (COZAAR) 25 MG tablet TAKE ONE TABLET BY MOUTH ONCE DAILY 30 tablet 0  . losartan (COZAAR) 25 MG tablet TAKE ONE TABLET BY MOUTH ONCE DAILY  30 tablet 5  . potassium chloride SA (KLOR-CON M20) 20 MEQ tablet Take 1 tablet (20 mEq total) by mouth daily. 30 tablet 3  . sildenafil (VIAGRA) 100 MG tablet Take 0.5-1 tablets (50-100 mg total) by mouth daily as needed for erectile dysfunction. 5 tablet 11  . simvastatin (ZOCOR) 20 MG tablet TAKE ONE TABLET BY MOUTH AT BEDTIME 30 tablet 5  . UNISTRIP1 GENERIC test strip TEST BLOOD SUGARS 4 TIMES DAILY 100 each 8   No current facility-administered medications on file prior to visit.     No Known Allergies  Family History  Problem Relation Age of Onset  . Diabetes Father   . Hypertension Father   . Alcohol abuse Father   . Diabetes Sister   . Breast cancer Mother   . Colon cancer Neg Hx   . Pancreatic cancer Neg Hx   . Stomach cancer Neg Hx     BP 110/60   Pulse (!) 54   Ht 5' 11.5" (1.816 m)   Wt 226 lb (102.5 kg)   BMI 31.08 kg/m    Review of Systems He denies hypoglycemia. No weight change.      Objective:   Physical Exam  VITAL SIGNS:  See vs page GENERAL: no distress Pulses: dorsalis pedis intact bilat.   MSK: no deformity of the feet CV: trace bilat leg edema Skin:  no ulcer on the feet.  normal color and temp on the feet. Neuro: sensation is intact to touch on the feet Ext: There is bilateral onychomycosis of the toenails.      Lab Results  Component Value Date   HGBA1C 5.4 08/02/2016   Lab Results  Component Value Date   CREATININE 0.98 08/02/2016   BUN 15 08/02/2016   NA 138 08/02/2016   K 4.2 08/02/2016   CL 105 08/02/2016   CO2 28 08/02/2016      Assessment & Plan:  Type 1 DM: still overcontrolled.

## 2016-08-20 ENCOUNTER — Telehealth: Payer: Self-pay | Admitting: Family Medicine

## 2016-08-20 NOTE — Telephone Encounter (Signed)
Pt request refill  sildenafil (VIAGRA) 100 MG tablet  [facility] [address]

## 2016-08-24 MED ORDER — SILDENAFIL CITRATE 100 MG PO TABS: 50.0000 mg | ORAL_TABLET | Freq: Every day | ORAL | 11 refills | Status: DC | PRN

## 2016-08-24 NOTE — Telephone Encounter (Signed)
Call in #10 with 11 rf 

## 2016-08-24 NOTE — Telephone Encounter (Signed)
I sent script e-scribe to below pharmacy.  

## 2016-11-18 ENCOUNTER — Ambulatory Visit (INDEPENDENT_AMBULATORY_CARE_PROVIDER_SITE_OTHER): Payer: BC Managed Care – PPO | Admitting: Endocrinology

## 2016-11-18 ENCOUNTER — Encounter: Payer: Self-pay | Admitting: Endocrinology

## 2016-11-18 VITALS — BP 112/64 | HR 71 | Ht 71.5 in | Wt 223.0 lb

## 2016-11-18 DIAGNOSIS — N189 Chronic kidney disease, unspecified: Secondary | ICD-10-CM

## 2016-11-18 DIAGNOSIS — E1022 Type 1 diabetes mellitus with diabetic chronic kidney disease: Secondary | ICD-10-CM | POA: Diagnosis not present

## 2016-11-18 LAB — POCT GLYCOSYLATED HEMOGLOBIN (HGB A1C): Hemoglobin A1C: 5.5

## 2016-11-18 MED ORDER — BASAGLAR KWIKPEN 100 UNIT/ML ~~LOC~~ SOPN: 50.0000 [IU] | PEN_INJECTOR | SUBCUTANEOUS | 11 refills | Status: DC

## 2016-11-18 NOTE — Patient Instructions (Addendum)
check your blood sugar twice a day: vary the time of day, between before the 3 meals, and at bedtime.  also check if you have symptoms of your blood sugar being too high or too low.  please keep a record of the readings and bring it to your next appointment here.  You can write it on any piece of paper.  please call us sooner if your blood sugar goes below 70, or if you have a lot of readings over 200.   Please reduce the insulin to 50 units daily.  Please come back for a follow-up appointment in 3 months.   On this type of insulin schedule, you should eat meals on a regular schedule.  If a meal is missed or significantly delayed, your blood sugar could go low.

## 2016-11-18 NOTE — Progress Notes (Signed)
Subjective:    Patient ID: Kenneth [name], male    DOB: [birth year], 58 y.o.   MRN: [id number]  HPI Pt returns for f/u of diabetes mellitus: DM type: 1 Dx'ed: [id number] Complications: renal insufficiency.  Therapy: insulin since dx DKA: twice: at the time of dx, and approx 2011.  Pancreatitis: never.   Other: pt says DKA episodes were related to alcoholism; he no longer consumes EtOH; in early 2015, he was changed to a qd insulin regimen, due to poor results with multiple daily injections; he works 2nd shift, setting up for events. Interval history: Pt says he never misses the insulin. no cbg record, but states cbg's are well-controlled.  pt states he feels well in general. He takes 70 units qd.  Past Medical History:  Diagnosis Date  . Alcohol abuse   . Angina   . Chronic kidney disease   . Diabetes mellitus   . Hyperlipidemia   . Hypertension     No past surgical history on file.  Social History   Social History  . Marital status: Single    Spouse name: N/A  . Number of children: N/A  . Years of education: N/A   Occupational History  . Not on file.   Social History Main Topics  . Smoking status: Former Smoker    Packs/day: 0.50    Types: Cigarettes    Quit date: 01/30/2012  . Smokeless tobacco: Never Used  . Alcohol use 0.0 oz/week     Comment: rare  . Drug use: No  . Sexual activity: Not on file   Other Topics Concern  . Not on file   Social History Narrative  . No narrative on file    Current Outpatient Prescriptions on File Prior to Visit  Medication Sig Dispense Refill  . acetaminophen (TYLENOL) 500 MG tablet Take 1,000 mg by mouth every 6 (six) hours as needed. For pain    . Lancets MISC 1 each by Does not apply route 3 (three) times daily.      Marland Kitchen losartan (COZAAR) 25 MG tablet TAKE ONE TABLET BY MOUTH ONCE DAILY 30 tablet 5  . potassium chloride SA (KLOR-CON M20) 20 MEQ tablet Take 1 tablet (20 mEq total) by mouth daily. 30 tablet 3  . simvastatin  (ZOCOR) 20 MG tablet TAKE ONE TABLET BY MOUTH AT BEDTIME 30 tablet 5  . UNISTRIP1 GENERIC test strip TEST BLOOD SUGARS 4 TIMES DAILY 100 each 8  . sildenafil (VIAGRA) 100 MG tablet Take 0.5-1 tablets (50-100 mg total) by mouth daily as needed for erectile dysfunction. 10 tablet 11   No current facility-administered medications on file prior to visit.     No Known Allergies  Family History  Problem Relation Age of Onset  . Diabetes Father   . Hypertension Father   . Alcohol abuse Father   . Diabetes Sister   . Breast cancer Mother   . Colon cancer Neg Hx   . Pancreatic cancer Neg Hx   . Stomach cancer Neg Hx     BP 112/64   Pulse 71   Ht 5' 11.5" (1.816 m)   Wt 223 lb (101.2 kg)   SpO2 96%   BMI 30.67 kg/m    Review of Systems He denies hypoglycemia.     Objective:   Physical Exam VITAL SIGNS:  See vs page GENERAL: no distress Pulses: dorsalis pedis intact bilat.   MSK: no deformity of the feet CV: no leg edema Skin:  no ulcer  on the feet.  normal color and temp on the feet. Neuro: sensation is intact to touch on the feet.  Ext: There is bilateral onychomycosis of the toenails.     A1c=5.5%    Assessment & Plan:  Type 1 DM, with renal insufficiency: overcontrolled.    Patient is advised the following: Patient Instructions  check your blood sugar twice a day: vary the time of day, between before the 3 meals, and at bedtime.  also check if you have symptoms of your blood sugar being too high or too low.  please keep a record of the readings and bring it to your next appointment here.  You can write it on any piece of paper.  please call us sooner if your blood sugar goes below 70, or if you have a lot of readings over 200.   Please reduce the insulin to 50 units daily.  Please come back for a follow-up appointment in 3 months.   On this type of insulin schedule, you should eat meals on a regular schedule.  If a meal is missed or significantly delayed, your blood  sugar could go low.

## 2017-02-17 ENCOUNTER — Encounter: Payer: Self-pay | Admitting: Endocrinology

## 2017-02-17 ENCOUNTER — Ambulatory Visit (INDEPENDENT_AMBULATORY_CARE_PROVIDER_SITE_OTHER): Payer: BC Managed Care – PPO | Admitting: Endocrinology

## 2017-02-17 VITALS — BP 110/70 | HR 65 | Ht 71.5 in | Wt 222.0 lb

## 2017-02-17 DIAGNOSIS — N189 Chronic kidney disease, unspecified: Secondary | ICD-10-CM

## 2017-02-17 DIAGNOSIS — E1022 Type 1 diabetes mellitus with diabetic chronic kidney disease: Secondary | ICD-10-CM

## 2017-02-17 NOTE — Progress Notes (Signed)
Subjective:    Patient ID: Kenneth [name], male    DOB: 1956-05-31, 61 y.o.   MRN: [id number]  HPI Pt returns for f/u of diabetes mellitus: DM type: 1 Dx'ed: [id number] Complications: renal insufficiency.  Therapy: insulin since dx DKA: twice: at the time of dx, and approx 2011.  Pancreatitis: never.   Other: pt says DKA episodes were related to alcoholism; he no longer consumes EtOH; in early 2015, he was changed to a qd insulin regimen, due to poor results with multiple daily injections; he works 2nd shift, setting up for events.   Interval history: Pt says he never misses the insulin. no cbg record, but states cbg's are well-controlled.  There is no trend throughout the day.  pt states he feels well in general.    Past Medical History:  Diagnosis Date  . Alcohol abuse   . Angina   . Chronic kidney disease   . Diabetes mellitus   . Hyperlipidemia   . Hypertension     No past surgical history on file.  Social History   Social History  . Marital status: Single    Spouse name: N/A  . Number of children: N/A  . Years of education: N/A   Occupational History  . Not on file.   Social History Main Topics  . Smoking status: Former Smoker    Packs/day: 0.50    Types: Cigarettes    Quit date: 01/30/2012  . Smokeless tobacco: Never Used  . Alcohol use 0.0 oz/week     Comment: rare  . Drug use: No  . Sexual activity: Not on file   Other Topics Concern  . Not on file   Social History Narrative  . No narrative on file    Current Outpatient Prescriptions on File Prior to Visit  Medication Sig Dispense Refill  . acetaminophen (TYLENOL) 500 MG tablet Take 1,000 mg by mouth every 6 (six) hours as needed. For pain    . Insulin Glargine (BASAGLAR KWIKPEN) 100 UNIT/ML SOPN Inject 0.5 mLs (50 Units total) into the skin every morning. 3 mL 11  . Lancets MISC 1 each by Does not apply route 3 (three) times daily.      Marland Kitchen losartan (COZAAR) 25 MG tablet TAKE ONE TABLET BY MOUTH ONCE  DAILY 30 tablet 5  . potassium chloride SA (KLOR-CON M20) 20 MEQ tablet Take 1 tablet (20 mEq total) by mouth daily. 30 tablet 3  . simvastatin (ZOCOR) 20 MG tablet TAKE ONE TABLET BY MOUTH AT BEDTIME 30 tablet 5  . UNISTRIP1 GENERIC test strip TEST BLOOD SUGARS 4 TIMES DAILY 100 each 8  . sildenafil (VIAGRA) 100 MG tablet Take 0.5-1 tablets (50-100 mg total) by mouth daily as needed for erectile dysfunction. 10 tablet 11   No current facility-administered medications on file prior to visit.     No Known Allergies  Family History  Problem Relation Age of Onset  . Diabetes Father   . Hypertension Father   . Alcohol abuse Father   . Diabetes Sister   . Breast cancer Mother   . Colon cancer Neg Hx   . Pancreatic cancer Neg Hx   . Stomach cancer Neg Hx     BP 110/70   Pulse 65   Ht 5' 11.5" (1.816 m)   Wt 222 lb (100.7 kg)   SpO2 99%   BMI 30.53 kg/m    Review of Systems He denies hypoglycemia.      Objective:   Physical  Exam VITAL SIGNS:  See vs page GENERAL: no distress Pulses: dorsalis pedis intact bilat.   MSK: no deformity of the feet CV: no leg edema Skin:  no ulcer on the feet.  normal color and temp on the feet.  Neuro: sensation is intact to touch on the feet.  Ext: There is bilateral onychomycosis of the toenails.    A1c=7.0%    Assessment & Plan:  Type 1 DM: with renal insuff: well-controlled.   Patient is advised the following: Patient Instructions  check your blood sugar twice a day: vary the time of day, between before the 3 meals, and at bedtime.  also check if you have symptoms of your blood sugar being too high or too low.  please keep a record of the readings and bring it to your next appointment here.  You can write it on any piece of paper.  please call us sooner if your blood sugar goes below 70, or if you have a lot of readings over 200.   Please continue the same insulin Please come back for a follow-up appointment in 3 months.   On this type  of insulin schedule, you should eat meals on a regular schedule.  If a meal is missed or significantly delayed, your blood sugar could go low.

## 2017-02-17 NOTE — Patient Instructions (Addendum)
check your blood sugar twice a day: vary the time of day, between before the 3 meals, and at bedtime.  also check if you have symptoms of your blood sugar being too high or too low.  please keep a record of the readings and bring it to your next appointment here.  You can write it on any piece of paper.  please call us sooner if your blood sugar goes below 70, or if you have a lot of readings over 200.   Please continue the same insulin Please come back for a follow-up appointment in 3 months.   On this type of insulin schedule, you should eat meals on a regular schedule.  If a meal is missed or significantly delayed, your blood sugar could go low.

## 2017-02-18 LAB — POCT GLYCOSYLATED HEMOGLOBIN (HGB A1C): Hemoglobin A1C: 7

## 2017-03-18 ENCOUNTER — Other Ambulatory Visit: Payer: Self-pay | Admitting: Family Medicine

## 2017-03-21 ENCOUNTER — Other Ambulatory Visit: Payer: Self-pay | Admitting: Family Medicine

## 2017-03-21 NOTE — Telephone Encounter (Signed)
Pt need new Rx for losartan and simvastatin  Pharm:  [facility].

## 2017-03-21 NOTE — Telephone Encounter (Signed)
Rx done. 

## 2017-04-05 ENCOUNTER — Encounter (HOSPITAL_COMMUNITY): Payer: Self-pay | Admitting: Family Medicine

## 2017-04-05 ENCOUNTER — Ambulatory Visit (INDEPENDENT_AMBULATORY_CARE_PROVIDER_SITE_OTHER)
Admission: EM | Admit: 2017-04-05 | Discharge: 2017-04-05 | Disposition: A | Discharge status: Home / Self Care | Payer: BC Managed Care – PPO | Source: Home / Self Care | Attending: Internal Medicine | Admitting: Internal Medicine

## 2017-04-05 ENCOUNTER — Observation Stay (HOSPITAL_COMMUNITY)
Admission: EM | Admit: 2017-04-05 | Discharge: 2017-04-07 | Disposition: A | Discharge status: Home / Self Care | Payer: BC Managed Care – PPO | Attending: Surgery | Admitting: Surgery

## 2017-04-05 ENCOUNTER — Encounter (HOSPITAL_COMMUNITY): Payer: Self-pay | Admitting: Emergency Medicine

## 2017-04-05 ENCOUNTER — Emergency Department (HOSPITAL_COMMUNITY): Payer: BC Managed Care – PPO

## 2017-04-05 DIAGNOSIS — Z79899 Other long term (current) drug therapy: Secondary | ICD-10-CM | POA: Insufficient documentation

## 2017-04-05 DIAGNOSIS — R1031 Right lower quadrant pain: Secondary | ICD-10-CM

## 2017-04-05 DIAGNOSIS — N189 Chronic kidney disease, unspecified: Secondary | ICD-10-CM | POA: Insufficient documentation

## 2017-04-05 DIAGNOSIS — Z794 Long term (current) use of insulin: Secondary | ICD-10-CM | POA: Insufficient documentation

## 2017-04-05 DIAGNOSIS — K353 Acute appendicitis with localized peritonitis: Secondary | ICD-10-CM | POA: Diagnosis not present

## 2017-04-05 DIAGNOSIS — K3589 Other acute appendicitis without perforation or gangrene: Secondary | ICD-10-CM

## 2017-04-05 DIAGNOSIS — I129 Hypertensive chronic kidney disease with stage 1 through stage 4 chronic kidney disease, or unspecified chronic kidney disease: Secondary | ICD-10-CM | POA: Diagnosis not present

## 2017-04-05 DIAGNOSIS — Z87891 Personal history of nicotine dependence: Secondary | ICD-10-CM | POA: Diagnosis not present

## 2017-04-05 DIAGNOSIS — K3580 Unspecified acute appendicitis: Secondary | ICD-10-CM | POA: Diagnosis present

## 2017-04-05 DIAGNOSIS — E785 Hyperlipidemia, unspecified: Secondary | ICD-10-CM | POA: Diagnosis not present

## 2017-04-05 DIAGNOSIS — K429 Umbilical hernia without obstruction or gangrene: Secondary | ICD-10-CM | POA: Diagnosis not present

## 2017-04-05 DIAGNOSIS — R109 Unspecified abdominal pain: Secondary | ICD-10-CM | POA: Diagnosis present

## 2017-04-05 DIAGNOSIS — E1022 Type 1 diabetes mellitus with diabetic chronic kidney disease: Secondary | ICD-10-CM | POA: Insufficient documentation

## 2017-04-05 LAB — CBC
HCT: 37.4 % — ABNORMAL LOW (ref 39.0–52.0)
Hemoglobin: 12.9 g/dL — ABNORMAL LOW (ref 13.0–17.0)
MCH: 31.1 pg (ref 26.0–34.0)
MCHC: 34.5 g/dL (ref 30.0–36.0)
MCV: 90.1 fL (ref 78.0–100.0)
PLATELETS: 236 10*3/uL (ref 150–400)
RBC: 4.15 MIL/uL — ABNORMAL LOW (ref 4.22–5.81)
RDW: 12.4 % (ref 11.5–15.5)
WBC: 13.1 10*3/uL — AB (ref 4.0–10.5)

## 2017-04-05 LAB — URINALYSIS, ROUTINE W REFLEX MICROSCOPIC
Bacteria, UA: NONE SEEN
Bilirubin Urine: NEGATIVE
GLUCOSE, UA: NEGATIVE mg/dL
Hgb urine dipstick: NEGATIVE
KETONES UR: NEGATIVE mg/dL
LEUKOCYTES UA: NEGATIVE
Nitrite: NEGATIVE
PH: 6 (ref 5.0–8.0)
Protein, ur: 30 mg/dL — AB
SPECIFIC GRAVITY, URINE: 1.024 (ref 1.005–1.030)
Squamous Epithelial / LPF: NONE SEEN

## 2017-04-05 LAB — COMPREHENSIVE METABOLIC PANEL
ALK PHOS: 88 U/L (ref 38–126)
ALT: 16 U/L — AB (ref 17–63)
AST: 15 U/L (ref 15–41)
Albumin: 4.2 g/dL (ref 3.5–5.0)
Anion gap: 8 (ref 5–15)
BILIRUBIN TOTAL: 0.5 mg/dL (ref 0.3–1.2)
BUN: 7 mg/dL (ref 6–20)
CALCIUM: 9.8 mg/dL (ref 8.9–10.3)
CO2: 26 mmol/L (ref 22–32)
CREATININE: 1.03 mg/dL (ref 0.61–1.24)
Chloride: 103 mmol/L (ref 101–111)
Glucose, Bld: 126 mg/dL — ABNORMAL HIGH (ref 65–99)
Potassium: 4 mmol/L (ref 3.5–5.1)
Sodium: 137 mmol/L (ref 135–145)
TOTAL PROTEIN: 7.8 g/dL (ref 6.5–8.1)

## 2017-04-05 LAB — LIPASE, BLOOD: Lipase: 14 U/L (ref 11–51)

## 2017-04-05 MED ORDER — IOPAMIDOL (ISOVUE-300) INJECTION 61%
INTRAVENOUS | Status: AC
  Filled 2017-04-05: qty 100

## 2017-04-05 MED ORDER — ONDANSETRON HCL 4 MG/2ML IJ SOLN
4.0000 mg | Freq: Once | INTRAMUSCULAR | Status: AC
  Administered 2017-04-05: 4 mg via INTRAVENOUS
  Filled 2017-04-05: qty 2

## 2017-04-05 MED ORDER — MORPHINE SULFATE (PF) 4 MG/ML IV SOLN
4.0000 mg | Freq: Once | INTRAVENOUS | Status: AC
  Administered 2017-04-05: 4 mg via INTRAVENOUS
  Filled 2017-04-05: qty 1

## 2017-04-05 MED ORDER — IOPAMIDOL (ISOVUE-300) INJECTION 61%
INTRAVENOUS | Status: AC
  Administered 2017-04-05: 100 mL
  Filled 2017-04-05: qty 100

## 2017-04-05 NOTE — Discharge Instructions (Signed)
Because if your abdominal pain in the lower right quadrant, I recommend you go to the emergency room as soon as possible. Do not eat anything, and do not drink anything until you're cleared to do so by the ER physician

## 2017-04-05 NOTE — ED Triage Notes (Signed)
Pt arrives from home for intermittent abdominal pain x1 month. States worse today because it hasn't gone away today. Reports emesis x1, denies fevers, diarrhea. States pain was throughout belly but now on RUQ and RLQ. Sent from urgent care to r/o appendicitis.

## 2017-04-05 NOTE — ED Provider Notes (Signed)
[facility] DEPT Provider Note   CSN: [id number] Arrival date & time: 04/05/17  1944     History   Chief Complaint Chief Complaint  Patient presents with  . Abdominal Pain    HPI Kenneth [name] is a 61 y.o. male.  The history is provided by the patient and medical records.  Abdominal Pain   Associated symptoms include nausea and vomiting.     61 year old male with history of alcohol abuse, chronic kidney disease, diabetes, hyperlipidemia, hypertension, presenting to the ED for abdominal pain. Patient reports this is been intermittent for the past month and a half, but got worse over the past few days, especially today. He states pain is throughout his entire abdomen, but worse on his right lower side. He reports some associated nausea or vomiting. He has not noticed any alleviating or exacerbating factors. He denies any fever or chills. No diarrhea, bowel movements have been normal. No prior abdominal surgeries. He has had a colonoscopy which she reports was normal. No history of GI issues in the past. Has not tried anything for pain today. He was seen in urgent care earlier today and sent here for further evaluation.  Past Medical History:  Diagnosis Date  . Alcohol abuse   . Angina   . Chronic kidney disease   . Diabetes mellitus   . Hyperlipidemia   . Hypertension     Patient Active Problem List   Diagnosis Date Noted  . Knee pain, bilateral 01/27/2016  . Numbness and tingling 06/18/2014  . Type 1 diabetes mellitus with renal manifestations ([place] [facility]) 02/21/2014  . ARF (acute renal failure) ([place]) 02/05/2012  . DKA, type 2 ([place]) 02/05/2012  . Hyperkalemia 02/05/2012  . Tobacco abuse 02/05/2012  . Hyperlipidemia 10/28/2011  . Essential hypertension 09/29/2007    History reviewed. No pertinent surgical history.     Home Medications    Prior to Admission medications   Medication Sig Start Date End Date Taking? Authorizing Provider  acetaminophen (TYLENOL) 500  MG tablet Take 1,000 mg by mouth every 6 (six) hours as needed. For pain    Historical Provider, MD  Insulin Glargine (BASAGLAR KWIKPEN) 100 UNIT/ML SOPN Inject 0.5 mLs (50 Units total) into the skin every morning. 11/18/16   Renato Shin, MD  Lancets MISC 1 each by Does not apply route 3 (three) times daily.      Historical Provider, MD  losartan (COZAAR) 25 MG tablet TAKE ONE TABLET BY MOUTH ONCE DAILY 08/19/16   Laurey Morale, MD  losartan (COZAAR) 25 MG tablet TAKE ONE TABLET BY MOUTH ONCE DAILY 03/21/17   Laurey Morale, MD  potassium chloride SA (KLOR-CON M20) 20 MEQ tablet Take 1 tablet (20 mEq total) by mouth daily. 09/18/14   Renato Shin, MD  sildenafil (VIAGRA) 100 MG tablet Take 0.5-1 tablets (50-100 mg total) by mouth daily as needed for erectile dysfunction. 08/24/16 09/23/16  Laurey Morale, MD  simvastatin (ZOCOR) 20 MG tablet TAKE ONE TABLET BY MOUTH AT BEDTIME 03/21/17   Laurey Morale, MD  UNISTRIP1 GENERIC test strip TEST BLOOD SUGARS 4 TIMES DAILY 02/18/15   Renato Shin, MD    Family History Family History  Problem Relation Age of Onset  . Diabetes Father   . Hypertension Father   . Alcohol abuse Father   . Diabetes Sister   . Breast cancer Mother   . Colon cancer Neg Hx   . Pancreatic cancer Neg Hx   . Stomach cancer Neg Hx  Social History Social History  Substance Use Topics  . Smoking status: Former Smoker    Packs/day: 0.50    Types: Cigarettes    Quit date: 01/30/2012  . Smokeless tobacco: Never Used  . Alcohol use 0.0 oz/week     Comment: rare     Allergies   Patient has no known allergies.   Review of Systems Review of Systems  Gastrointestinal: Positive for abdominal pain, nausea and vomiting.  All other systems reviewed and are negative.    Physical Exam Updated Vital Signs BP (!) 144/77 (BP Location: Right Arm)   Pulse 79   Temp 99.5 F (37.5 C) (Oral)   Resp 18   Ht 5\' 11"  (1.803 m)   Wt 99.8 kg   SpO2 96%   BMI 30.68 kg/m   Physical  Exam  Constitutional: He is oriented to person, place, and time. He appears well-developed and well-nourished.  HENT:  Head: Normocephalic and atraumatic.  Mouth/Throat: Oropharynx is clear and moist.  Eyes: Conjunctivae and EOM are normal. Pupils are equal, round, and reactive to light.  Neck: Normal range of motion.  Cardiovascular: Normal rate, regular rhythm and normal heart sounds.   Pulmonary/Chest: Effort normal and breath sounds normal.  Abdominal: Soft. Bowel sounds are normal. There is tenderness.  Somewhat tender throughout but profoundly worse in the RLQ and suprapubic region  Musculoskeletal: Normal range of motion.  Neurological: He is alert and oriented to person, place, and time.  Skin: Skin is warm and dry.  Psychiatric: He has a normal mood and affect.  Nursing note and vitals reviewed.    ED Treatments / Results  Labs (all labs ordered are listed, but only abnormal results are displayed) Labs Reviewed  COMPREHENSIVE METABOLIC PANEL - Abnormal; Notable for the following:       Result Value   Glucose, Bld 126 (*)    ALT 16 (*)    All other components within normal limits  CBC - Abnormal; Notable for the following:    WBC 13.1 (*)    RBC 4.15 (*)    Hemoglobin 12.9 (*)    HCT 37.4 (*)    All other components within normal limits  URINALYSIS, ROUTINE W REFLEX MICROSCOPIC - Abnormal; Notable for the following:    Protein, ur 30 (*)    All other components within normal limits  LIPASE, BLOOD    EKG  EKG Interpretation None       Radiology Ct Abdomen Pelvis W Contrast  Result Date: 04/05/2017 CLINICAL DATA:  Initial evaluation for acute right lower quadrant abdominal pain. EXAM: CT ABDOMEN AND PELVIS WITH CONTRAST TECHNIQUE: Multidetector CT imaging of the abdomen and pelvis was performed using the standard protocol following bolus administration of intravenous contrast. CONTRAST:  156mL ISOVUE-300 IOPAMIDOL (ISOVUE-300) INJECTION 61% COMPARISON:  Prior  radiograph from 08/03/2011. FINDINGS: Lower chest: Scattered atelectatic changes present within the visualized lung bases. Calcified granuloma noted at the right lung base. Additional 5 mm right lower lobe nodule noted (series 4, image 10), indeterminate. Hepatobiliary: Liver demonstrates a normal contrast enhanced appearance. Gallbladder within normal limits. No biliary dilatation. Pancreas: Pancreas within normal limits. Spleen: Spleen within normal limits. Adrenals/Urinary Tract: Adrenal glands are normal. Kidneys equal in size with symmetric enhancement. Subcentimeter hypodensity within the right kidney too small the characterize, but statistically likely reflects a small cyst. No nephrolithiasis, hydronephrosis, or focal enhancing renal mass. No hydroureter. Partially distended bladder within normal limits. Stomach/Bowel: Stomach within normal limits. No evidence for bowel obstruction.  Appendix well visualized within the right lower quadrant, positioned anterior to the cecum (series 3, image 61). Appendix is prominent measuring up to 10 mm in diameter. Associated mild mucosal thickening with hazy periappendiceal fat stranding. Findings concerning for acute appendicitis, overall mild in appearance at this point. No evidence for perforation or other complication. No other acute inflammatory changes seen about the bowels. Colon is largely decompressed. Vascular/Lymphatic: Normal intravascular enhancement seen throughout the intra-abdominal aorta and its branch vessels. Mild atheromatous plaque within the aorta itself. No aneurysm. No adenopathy. Reproductive: Prostate within normal limits. Other: No free air or fluid. Small bilateral fat containing inguinal hernias noted. Additional small fat containing paraumbilical hernia present as well. No associated inflammation. Musculoskeletal: No acute osseous abnormality. No worrisome lytic or blastic osseous lesions. Bilateral facet arthrosis noted within the lower  lumbar spine. IMPRESSION: 1. Findings concerning for acute appendicitis as above. No evidence for perforation or other complication. 2. No other acute intra-abdominal or pelvic process identified. Electronically Signed   By: Jeannine Boga M.D.   On: 04/05/2017 23:47    Procedures Procedures (including critical care time)  Medications Ordered in ED Medications  enoxaparin (LOVENOX) injection 40 mg (not administered)  0.9 %  sodium chloride infusion (not administered)  cefTRIAXone (ROCEPHIN) 2 g in dextrose 5 % 50 mL IVPB (not administered)    And  metroNIDAZOLE (FLAGYL) IVPB 500 mg (not administered)  acetaminophen (TYLENOL) tablet 650 mg (not administered)    Or  acetaminophen (TYLENOL) suppository 650 mg (not administered)  HYDROmorphone (DILAUDID) injection 0.5 mg (not administered)  oxyCODONE (Oxy IR/ROXICODONE) immediate release tablet 5-10 mg (not administered)  diphenhydrAMINE (BENADRYL) capsule 25 mg (not administered)    Or  diphenhydrAMINE (BENADRYL) injection 25 mg (not administered)  docusate sodium (COLACE) capsule 100 mg (not administered)  ondansetron (ZOFRAN-ODT) disintegrating tablet 4 mg (not administered)    Or  ondansetron (ZOFRAN) injection 4 mg (not administered)  hydrALAZINE (APRESOLINE) injection 10 mg (not administered)  insulin aspart (novoLOG) injection 0-15 Units (not administered)  morphine 4 MG/ML injection 4 mg (4 mg Intravenous Given 04/05/17 2243)  ondansetron (ZOFRAN) injection 4 mg (4 mg Intravenous Given 04/05/17 2243)  iopamidol (ISOVUE-300) 61 % injection (100 mLs  Contrast Given 04/05/17 2323)     Initial Impression / Assessment and Plan / ED Course  I have reviewed the triage vital signs and the nursing notes.  Pertinent labs & imaging results that were available during my care of the patient were reviewed by me and considered in my medical decision making (see chart for details).  61 year old male here with abdominal pain. Has been  intermittent over the past month and a half. Reports some nausea and vomiting. Here he is afebrile and nontoxic. Abdomen is soft with some generalized tenderness, but more severe in the right lower quadrant. Lab work with leukocytosis noted at 13.1. CT scan was obtained revealing acute appendicitis. General surgery was consulted--- Dr. Kae Heller has evaluated in the ED and will admit for ongoing care.  Final Clinical Impressions(s) / ED Diagnoses   Final diagnoses:  Other acute appendicitis    New Prescriptions New Prescriptions   No medications on file     Kathryne Hitch 04/06/17 0138    Drenda Freeze, MD 04/07/17 2149

## 2017-04-05 NOTE — ED Provider Notes (Signed)
CSN: [id number]     Arrival date & time 04/05/17  1829 History   None    Chief Complaint  Patient presents with  . Abdominal Pain   (Consider location/radiation/quality/duration/timing/severity/associated sxs/prior Treatment) 61 year old male presents to clinic for evaluation of abdominal pain. He states he's had this pain for approximately 1 month, however the pain significantly worsens today, and moved to a different location today. States he has been having just general pain throughout his entire "belly" but today this pain has sharpened and is now right lower quadrant. He is developed nausea, and vomiting today, has had no diarrhea, last normal bowel movement was today   The history is provided by the patient.  Abdominal Pain  Pain location:  RUQ Pain quality: cramping, sharp and stabbing   Pain radiates to:  Does not radiate Pain severity:  Severe Onset quality:  Gradual Duration:  1 day Timing:  Constant Progression:  Worsening Chronicity:  New Context: not awakening from sleep and not diet changes   Relieved by:  None tried Worsened by:  Movement, position changes and palpation Ineffective treatments:  None tried Associated symptoms: nausea and vomiting   Associated symptoms: no anorexia, no chest pain, no chills, no cough, no diarrhea, no fever and no shortness of breath     Past Medical History:  Diagnosis Date  . Alcohol abuse   . Angina   . Chronic kidney disease   . Diabetes mellitus   . Hyperlipidemia   . Hypertension    History reviewed. No pertinent surgical history. Family History  Problem Relation Age of Onset  . Diabetes Father   . Hypertension Father   . Alcohol abuse Father   . Diabetes Sister   . Breast cancer Mother   . Colon cancer Neg Hx   . Pancreatic cancer Neg Hx   . Stomach cancer Neg Hx    Social History  Substance Use Topics  . Smoking status: Former Smoker    Packs/day: 0.50    Types: Cigarettes    Quit date: 01/30/2012  .  Smokeless tobacco: Never Used  . Alcohol use 0.0 oz/week     Comment: rare    Review of Systems  Constitutional: Negative for chills and fever.  HENT: Negative.   Respiratory: Negative for cough and shortness of breath.   Cardiovascular: Negative for chest pain and palpitations.  Gastrointestinal: Positive for abdominal pain, nausea and vomiting. Negative for anorexia and diarrhea.  Genitourinary: Negative.   Musculoskeletal: Negative.   Skin: Negative.   Neurological: Negative.     Allergies  Patient has no known allergies.  Home Medications   Prior to Admission medications   Medication Sig Start Date End Date Taking? Authorizing Provider  acetaminophen (TYLENOL) 500 MG tablet Take 1,000 mg by mouth every 6 (six) hours as needed. For pain    Historical Provider, MD  Insulin Glargine (BASAGLAR KWIKPEN) 100 UNIT/ML SOPN Inject 0.5 mLs (50 Units total) into the skin every morning. 11/18/16   Renato Shin, MD  Lancets MISC 1 each by Does not apply route 3 (three) times daily.      Historical Provider, MD  losartan (COZAAR) 25 MG tablet TAKE ONE TABLET BY MOUTH ONCE DAILY 08/19/16   Laurey Morale, MD  losartan (COZAAR) 25 MG tablet TAKE ONE TABLET BY MOUTH ONCE DAILY 03/21/17   Laurey Morale, MD  potassium chloride SA (KLOR-CON M20) 20 MEQ tablet Take 1 tablet (20 mEq total) by mouth daily. 09/18/14   Renato Shin,  MD  sildenafil (VIAGRA) 100 MG tablet Take 0.5-1 tablets (50-100 mg total) by mouth daily as needed for erectile dysfunction. 08/24/16 09/23/16  Laurey Morale, MD  simvastatin (ZOCOR) 20 MG tablet TAKE ONE TABLET BY MOUTH AT BEDTIME 03/21/17   Laurey Morale, MD  Levander Campion GENERIC test strip TEST BLOOD SUGARS 4 TIMES DAILY 02/18/15   Renato Shin, MD   Meds Ordered and Administered this Visit  Medications - No data to display  BP 132/71 (BP Location: Right Arm)   Pulse 77   Temp 99.1 F (37.3 C) (Oral)   Resp 16   SpO2 100%  No data found.   Physical Exam  Constitutional: He  is oriented to person, place, and time. He appears well-developed and well-nourished. No distress.  HENT:  Head: Normocephalic and atraumatic.  Neck: Normal range of motion.  Cardiovascular: Normal rate and regular rhythm.   Pulmonary/Chest: Effort normal and breath sounds normal.  Abdominal: Soft. Bowel sounds are normal.  Reproducible right lower quadrant abdominal pain, negative straight leg raise.  Neurological: He is alert and oriented to person, place, and time.  Skin: Skin is warm and dry. Capillary refill takes less than 2 seconds. He is not diaphoretic.  Psychiatric: He has a normal mood and affect. His behavior is normal.  Nursing note and vitals reviewed.   Urgent Care Course     Procedures (including critical care time)  Labs Review Labs Reviewed - No data to display  Imaging Review No results found.      MDM   1. Right lower quadrant abdominal pain    Recommend going to the emergency room to rule out appendicitis, due to right lower quadrant pain reproducible with palpation.     Barnet Glasgow, NP 04/05/17 [phone]

## 2017-04-05 NOTE — ED Triage Notes (Signed)
Pt here for intermittent abd pain x 1 month and worsening. sts generalized. sts some vomiting.

## 2017-04-05 NOTE — ED Notes (Signed)
Pt back in room.

## 2017-04-06 ENCOUNTER — Encounter (HOSPITAL_COMMUNITY)
Admission: EM | Disposition: A | Discharge status: Home / Self Care | Payer: Self-pay | Source: Home / Self Care | Attending: Emergency Medicine

## 2017-04-06 ENCOUNTER — Observation Stay (HOSPITAL_COMMUNITY): Payer: BC Managed Care – PPO | Admitting: Certified Registered Nurse Anesthetist

## 2017-04-06 ENCOUNTER — Encounter (HOSPITAL_COMMUNITY): Payer: Self-pay | Admitting: Certified Registered Nurse Anesthetist

## 2017-04-06 DIAGNOSIS — K3580 Unspecified acute appendicitis: Secondary | ICD-10-CM | POA: Diagnosis present

## 2017-04-06 HISTORY — PX: LAPAROSCOPIC APPENDECTOMY: SHX408

## 2017-04-06 LAB — CBC
HCT: 38.2 % — ABNORMAL LOW (ref 39.0–52.0)
HEMOGLOBIN: 13.1 g/dL (ref 13.0–17.0)
MCH: 31 pg (ref 26.0–34.0)
MCHC: 34.3 g/dL (ref 30.0–36.0)
MCV: 90.3 fL (ref 78.0–100.0)
Platelets: 248 10*3/uL (ref 150–400)
RBC: 4.23 MIL/uL (ref 4.22–5.81)
RDW: 12.8 % (ref 11.5–15.5)
WBC: 13.9 10*3/uL — ABNORMAL HIGH (ref 4.0–10.5)

## 2017-04-06 LAB — GLUCOSE, CAPILLARY
GLUCOSE-CAPILLARY: 94 mg/dL (ref 65–99)
GLUCOSE-CAPILLARY: 106 mg/dL — AB (ref 65–99)
GLUCOSE-CAPILLARY: 107 mg/dL — AB (ref 65–99)
GLUCOSE-CAPILLARY: 63 mg/dL — AB (ref 65–99)
Glucose-Capillary: 101 mg/dL — ABNORMAL HIGH (ref 65–99)
Glucose-Capillary: 104 mg/dL — ABNORMAL HIGH (ref 65–99)
Glucose-Capillary: 80 mg/dL (ref 65–99)
Glucose-Capillary: 89 mg/dL (ref 65–99)
Glucose-Capillary: 109 mg/dL — ABNORMAL HIGH (ref 65–99)

## 2017-04-06 LAB — APTT: aPTT: 30 seconds (ref 24–36)

## 2017-04-06 LAB — BASIC METABOLIC PANEL
Anion gap: 8 (ref 5–15)
BUN: 8 mg/dL (ref 6–20)
CHLORIDE: 101 mmol/L (ref 101–111)
CO2: 28 mmol/L (ref 22–32)
CREATININE: 1.04 mg/dL (ref 0.61–1.24)
Calcium: 9.6 mg/dL (ref 8.9–10.3)
GFR calc non Af Amer: >60 mL/min (ref >60)
GLUCOSE: 116 mg/dL — AB (ref 65–99)
Potassium: 4.3 mmol/L (ref 3.5–5.1)
SODIUM: 137 mmol/L (ref 135–145)

## 2017-04-06 LAB — HIV ANTIBODY (ROUTINE TESTING W REFLEX): HIV Screen 4th Generation wRfx: NONREACTIVE

## 2017-04-06 LAB — PROTIME-INR
INR: 1.16
PROTHROMBIN TIME: 14.9 s (ref 11.4–15.2)

## 2017-04-06 LAB — SURGICAL PCR SCREEN
MRSA, PCR: NEGATIVE
Staphylococcus aureus: NEGATIVE

## 2017-04-06 SURGERY — APPENDECTOMY, LAPAROSCOPIC
Anesthesia: General | Site: Abdomen

## 2017-04-06 MED ORDER — METRONIDAZOLE IN NACL 5-0.79 MG/ML-% IV SOLN
500.0000 mg | Freq: Three times a day (TID) | INTRAVENOUS | Status: DC
  Administered 2017-04-06 – 2017-04-07 (×3): 500 mg via INTRAVENOUS
  Filled 2017-04-06 (×3): qty 100

## 2017-04-06 MED ORDER — LIDOCAINE 2% (20 MG/ML) 5 ML SYRINGE
INTRAMUSCULAR | Status: AC
  Filled 2017-04-06: qty 5

## 2017-04-06 MED ORDER — PROPOFOL 10 MG/ML IV BOLUS
INTRAVENOUS | Status: DC | PRN
  Administered 2017-04-06: 100 mg via INTRAVENOUS
  Administered 2017-04-06: 200 mg via INTRAVENOUS

## 2017-04-06 MED ORDER — BUPIVACAINE-EPINEPHRINE 0.25% -1:200000 IJ SOLN
INTRAMUSCULAR | Status: DC | PRN
  Administered 2017-04-06: 15 mL

## 2017-04-06 MED ORDER — ROCURONIUM BROMIDE 100 MG/10ML IV SOLN
INTRAVENOUS | Status: DC | PRN
  Administered 2017-04-06: 40 mg via INTRAVENOUS

## 2017-04-06 MED ORDER — HYDRALAZINE HCL 20 MG/ML IJ SOLN: 10.0000 mg | INTRAMUSCULAR | Status: DC | PRN

## 2017-04-06 MED ORDER — DEXTROSE 5 % IV SOLN
2.0000 g | Freq: Every day | INTRAVENOUS | Status: DC
  Administered 2017-04-06 (×2): 2 g via INTRAVENOUS
  Filled 2017-04-06 (×3): qty 2

## 2017-04-06 MED ORDER — BUPIVACAINE HCL (PF) 0.25 % IJ SOLN
INTRAMUSCULAR | Status: AC
  Filled 2017-04-06: qty 30

## 2017-04-06 MED ORDER — OXYCODONE HCL 5 MG PO TABS
5.0000 mg | ORAL_TABLET | ORAL | Status: DC | PRN
  Filled 2017-04-06: qty 2

## 2017-04-06 MED ORDER — HYDRALAZINE HCL 20 MG/ML IJ SOLN: 10.0000 mg | Freq: Four times a day (QID) | INTRAMUSCULAR | Status: DC | PRN

## 2017-04-06 MED ORDER — ACETAMINOPHEN 325 MG PO TABS
650.0000 mg | ORAL_TABLET | Freq: Four times a day (QID) | ORAL | Status: DC | PRN
  Administered 2017-04-06: 650 mg via ORAL
  Filled 2017-04-06: qty 2

## 2017-04-06 MED ORDER — OXYCODONE HCL 5 MG PO TABS
5.0000 mg | ORAL_TABLET | ORAL | Status: DC | PRN
  Administered 2017-04-06 – 2017-04-07 (×4): 10 mg via ORAL
  Filled 2017-04-06 (×3): qty 2

## 2017-04-06 MED ORDER — SUGAMMADEX SODIUM 200 MG/2ML IV SOLN
INTRAVENOUS | Status: AC
  Filled 2017-04-06: qty 2

## 2017-04-06 MED ORDER — ONDANSETRON HCL 4 MG/2ML IJ SOLN
INTRAMUSCULAR | Status: DC | PRN
  Administered 2017-04-06: 4 mg via INTRAVENOUS

## 2017-04-06 MED ORDER — ENOXAPARIN SODIUM 40 MG/0.4ML ~~LOC~~ SOLN
40.0000 mg | SUBCUTANEOUS | Status: DC
  Administered 2017-04-07: 40 mg via SUBCUTANEOUS
  Filled 2017-04-06: qty 0.4

## 2017-04-06 MED ORDER — PROPOFOL 10 MG/ML IV BOLUS
INTRAVENOUS | Status: AC
  Filled 2017-04-06: qty 20

## 2017-04-06 MED ORDER — MEPERIDINE HCL 25 MG/ML IJ SOLN: 6.2500 mg | INTRAMUSCULAR | Status: DC | PRN

## 2017-04-06 MED ORDER — 0.9 % SODIUM CHLORIDE (POUR BTL) OPTIME
TOPICAL | Status: DC | PRN
  Administered 2017-04-06: 1000 mL

## 2017-04-06 MED ORDER — ACETAMINOPHEN 650 MG RE SUPP: 650.0000 mg | Freq: Four times a day (QID) | RECTAL | Status: DC | PRN

## 2017-04-06 MED ORDER — MIDAZOLAM HCL 5 MG/5ML IJ SOLN
INTRAMUSCULAR | Status: DC | PRN
  Administered 2017-04-06: 2 mg via INTRAVENOUS

## 2017-04-06 MED ORDER — SUGAMMADEX SODIUM 200 MG/2ML IV SOLN
INTRAVENOUS | Status: DC | PRN
  Administered 2017-04-06: 300 mg via INTRAVENOUS

## 2017-04-06 MED ORDER — SUCCINYLCHOLINE CHLORIDE 200 MG/10ML IV SOSY
PREFILLED_SYRINGE | INTRAVENOUS | Status: AC
  Filled 2017-04-06: qty 10

## 2017-04-06 MED ORDER — LIDOCAINE HCL (CARDIAC) 20 MG/ML IV SOLN
INTRAVENOUS | Status: DC | PRN
  Administered 2017-04-06: 100 mg via INTRAVENOUS

## 2017-04-06 MED ORDER — PROMETHAZINE HCL 25 MG/ML IJ SOLN: 6.2500 mg | INTRAMUSCULAR | Status: DC | PRN

## 2017-04-06 MED ORDER — MIDAZOLAM HCL 2 MG/2ML IJ SOLN
INTRAMUSCULAR | Status: AC
  Filled 2017-04-06: qty 2

## 2017-04-06 MED ORDER — SODIUM CHLORIDE 0.9 % IV SOLN
INTRAVENOUS | Status: DC
  Administered 2017-04-06 (×2): via INTRAVENOUS

## 2017-04-06 MED ORDER — ROCURONIUM BROMIDE 50 MG/5ML IV SOSY
PREFILLED_SYRINGE | INTRAVENOUS | Status: AC
  Filled 2017-04-06: qty 10

## 2017-04-06 MED ORDER — ONDANSETRON HCL 4 MG/2ML IJ SOLN: 4.0000 mg | Freq: Four times a day (QID) | INTRAMUSCULAR | Status: DC | PRN

## 2017-04-06 MED ORDER — ONDANSETRON 4 MG PO TBDP: 4.0000 mg | ORAL_TABLET | Freq: Four times a day (QID) | ORAL | Status: DC | PRN

## 2017-04-06 MED ORDER — DIPHENHYDRAMINE HCL 50 MG/ML IJ SOLN: 25.0000 mg | Freq: Four times a day (QID) | INTRAMUSCULAR | Status: DC | PRN

## 2017-04-06 MED ORDER — DIPHENHYDRAMINE HCL 25 MG PO CAPS: 25.0000 mg | ORAL_CAPSULE | Freq: Four times a day (QID) | ORAL | Status: DC | PRN

## 2017-04-06 MED ORDER — OXYCODONE HCL 5 MG PO TABS: 5.0000 mg | ORAL_TABLET | Freq: Once | ORAL | Status: DC | PRN

## 2017-04-06 MED ORDER — LACTATED RINGERS IV SOLN
INTRAVENOUS | Status: DC
  Administered 2017-04-06 (×2): via INTRAVENOUS

## 2017-04-06 MED ORDER — SODIUM CHLORIDE 0.9 % IV BOLUS (SEPSIS)
500.0000 mL | Freq: Once | INTRAVENOUS | Status: AC
  Administered 2017-04-06: 500 mL via INTRAVENOUS

## 2017-04-06 MED ORDER — HYDROMORPHONE HCL 1 MG/ML IJ SOLN: 0.5000 mg | INTRAMUSCULAR | Status: DC | PRN

## 2017-04-06 MED ORDER — HYDROMORPHONE HCL 1 MG/ML IJ SOLN: 0.2500 mg | INTRAMUSCULAR | Status: DC | PRN

## 2017-04-06 MED ORDER — OXYCODONE HCL 5 MG/5ML PO SOLN: 5.0000 mg | Freq: Once | ORAL | Status: DC | PRN

## 2017-04-06 MED ORDER — SODIUM CHLORIDE 0.9 % IR SOLN
Status: DC | PRN
  Administered 2017-04-06: 1

## 2017-04-06 MED ORDER — SUCCINYLCHOLINE CHLORIDE 200 MG/10ML IV SOSY
PREFILLED_SYRINGE | INTRAVENOUS | Status: DC | PRN
  Administered 2017-04-06: 120 mg via INTRAVENOUS

## 2017-04-06 MED ORDER — DOCUSATE SODIUM 100 MG PO CAPS
100.0000 mg | ORAL_CAPSULE | Freq: Two times a day (BID) | ORAL | Status: DC
  Administered 2017-04-06 – 2017-04-07 (×2): 100 mg via ORAL
  Filled 2017-04-06 (×2): qty 1

## 2017-04-06 MED ORDER — INSULIN ASPART 100 UNIT/ML ~~LOC~~ SOLN: 0.0000 [IU] | SUBCUTANEOUS | Status: DC

## 2017-04-06 MED ORDER — FENTANYL CITRATE (PF) 250 MCG/5ML IJ SOLN
INTRAMUSCULAR | Status: AC
  Filled 2017-04-06: qty 5

## 2017-04-06 MED ORDER — FENTANYL CITRATE (PF) 100 MCG/2ML IJ SOLN
INTRAMUSCULAR | Status: DC | PRN
  Administered 2017-04-06: 50 ug via INTRAVENOUS
  Administered 2017-04-06: 100 ug via INTRAVENOUS
  Administered 2017-04-06 (×2): 50 ug via INTRAVENOUS

## 2017-04-06 SURGICAL SUPPLY — 44 items
ADH SKN CLS APL DERMABOND .7 (GAUZE/BANDAGES/DRESSINGS) ×1
APPLIER CLIP ROT 10 11.4 M/L (STAPLE)
APR CLP MED LRG 11.4X10 (STAPLE)
BAG SPEC RTRVL LRG 6X4 10 (ENDOMECHANICALS) ×1
BLADE CLIPPER SURG (BLADE) IMPLANT
CANISTER SUCT 3000ML PPV (MISCELLANEOUS) ×3 IMPLANT
CHLORAPREP W/TINT 26ML (MISCELLANEOUS) ×3 IMPLANT
CLIP APPLIE ROT 10 11.4 M/L (STAPLE) IMPLANT
COVER SURGICAL LIGHT HANDLE (MISCELLANEOUS) ×3 IMPLANT
CUTTER FLEX LINEAR 45M (STAPLE) ×3 IMPLANT
DERMABOND ADVANCED (GAUZE/BANDAGES/DRESSINGS) ×2
DERMABOND ADVANCED .7 DNX12 (GAUZE/BANDAGES/DRESSINGS) ×1 IMPLANT
ELECT REM PT RETURN 9FT ADLT (ELECTROSURGICAL) ×3
ELECTRODE REM PT RTRN 9FT ADLT (ELECTROSURGICAL) ×1 IMPLANT
GLOVE BIO SURGEON STRL SZ8 (GLOVE) ×3 IMPLANT
GLOVE BIOGEL PI IND STRL 8 (GLOVE) ×1 IMPLANT
GLOVE BIOGEL PI INDICATOR 8 (GLOVE) ×2
GOWN STRL REUS W/ TWL LRG LVL3 (GOWN DISPOSABLE) ×2 IMPLANT
GOWN STRL REUS W/ TWL XL LVL3 (GOWN DISPOSABLE) ×1 IMPLANT
GOWN STRL REUS W/TWL LRG LVL3 (GOWN DISPOSABLE) ×6
GOWN STRL REUS W/TWL XL LVL3 (GOWN DISPOSABLE) ×3
KIT BASIN OR (CUSTOM PROCEDURE TRAY) ×3 IMPLANT
KIT ROOM TURNOVER OR (KITS) ×3 IMPLANT
NEEDLE 22X1 1/2 (OR ONLY) (NEEDLE) ×3 IMPLANT
NS IRRIG 1000ML POUR BTL (IV SOLUTION) ×3 IMPLANT
PAD ARMBOARD 7.5X6 YLW CONV (MISCELLANEOUS) ×6 IMPLANT
POUCH SPECIMEN RETRIEVAL 10MM (ENDOMECHANICALS) ×3 IMPLANT
RELOAD 45 VASCULAR/THIN (ENDOMECHANICALS) ×3 IMPLANT
RELOAD STAPLE 45 2.5 WHT GRN (ENDOMECHANICALS) IMPLANT
RELOAD STAPLE 45 3.5 BLU ETS (ENDOMECHANICALS) IMPLANT
RELOAD STAPLE TA45 3.5 REG BLU (ENDOMECHANICALS) IMPLANT
SCISSORS LAP 5X35 DISP (ENDOMECHANICALS) IMPLANT
SET IRRIG TUBING LAPAROSCOPIC (IRRIGATION / IRRIGATOR) ×3 IMPLANT
SHEARS HARMONIC ACE PLUS 36CM (ENDOMECHANICALS) ×3 IMPLANT
SPECIMEN JAR SMALL (MISCELLANEOUS) ×3 IMPLANT
SUT VIC AB 4-0 PS2 27 (SUTURE) ×3 IMPLANT
TOWEL OR 17X24 6PK STRL BLUE (TOWEL DISPOSABLE) ×3 IMPLANT
TOWEL OR 17X26 10 PK STRL BLUE (TOWEL DISPOSABLE) ×3 IMPLANT
TRAY FOLEY CATH SILVER 16FR (SET/KITS/TRAYS/PACK) ×3 IMPLANT
TRAY LAPAROSCOPIC MC (CUSTOM PROCEDURE TRAY) ×3 IMPLANT
TROCAR XCEL 12X100 BLDLESS (ENDOMECHANICALS) ×3 IMPLANT
TROCAR XCEL BLUNT TIP 100MML (ENDOMECHANICALS) ×3 IMPLANT
TROCAR XCEL NON-BLD 5MMX100MML (ENDOMECHANICALS) ×3 IMPLANT
TUBING INSUFFLATION (TUBING) ×3 IMPLANT

## 2017-04-06 NOTE — Transfer of Care (Signed)
Immediate Anesthesia Transfer of Care Note  Patient: Kenneth [name]  Procedure(s) Performed: Procedure(s): APPENDECTOMY LAPAROSCOPIC with primary repair of umbilical hernia (N/A)  Patient Location: PACU  Anesthesia Type:General  Level of Consciousness: awake, alert  and oriented  Airway & Oxygen Therapy: Patient Spontanous Breathing and Patient connected to nasal cannula oxygen  Post-op Assessment: Report given to RN and Post -op Vital signs reviewed and stable  Post vital signs: Reviewed and stable  Last Vitals:  Vitals:   04/06/17 0859 04/06/17 1300  BP: (!) 97/49 113/66  Pulse: 66   Resp: 17   Temp: 36.8 C     Last Pain:  Vitals:   04/06/17 0859  TempSrc: Oral  PainSc:          Complications: No apparent anesthesia complications

## 2017-04-06 NOTE — Anesthesia Preprocedure Evaluation (Signed)
Anesthesia Evaluation  Patient identified by MRN, date of birth, ID band Patient awake    Reviewed: Allergy & Precautions, NPO status , Patient's Chart, lab work & pertinent test results  Airway Mallampati: II  TM Distance: >3 FB Neck ROM: Full    Dental no notable dental hx.    Pulmonary former smoker,    Pulmonary exam normal breath sounds clear to auscultation       Cardiovascular hypertension, + angina Normal cardiovascular exam Rhythm:Regular Rate:Normal     Neuro/Psych PSYCHIATRIC DISORDERS negative neurological ROS     GI/Hepatic negative GI ROS, Neg liver ROS,   Endo/Other  diabetes, Type 2  Renal/GU Renal disease     Musculoskeletal negative musculoskeletal ROS (+)   Abdominal   Peds  Hematology negative hematology ROS (+)   Anesthesia Other Findings   Reproductive/Obstetrics negative OB ROS                             Anesthesia Physical Anesthesia Plan  ASA: III and emergent  Anesthesia Plan: General   Post-op Pain Management:    Induction: Intravenous, Rapid sequence and Cricoid pressure planned  Airway Management Planned: Oral ETT  Additional Equipment:   Intra-op Plan:   Post-operative Plan: Extubation in OR  Informed Consent: I have reviewed the patients History and Physical, chart, labs and discussed the procedure including the risks, benefits and alternatives for the proposed anesthesia with the patient or authorized representative who has indicated his/her understanding and acceptance.   Dental advisory given  Plan Discussed with: CRNA  Anesthesia Plan Comments:         Anesthesia Quick Evaluation

## 2017-04-06 NOTE — Op Note (Signed)
04/05/2017 - 04/06/2017  4:20 PM  PATIENT:  Kenneth [name]  60 y.o. male  PRE-OPERATIVE DIAGNOSIS:  acute appendicitis  POST-OPERATIVE DIAGNOSIS:  acute appendicitis  PROCEDURE:  Procedure(s): APPENDECTOMY LAPAROSCOPIC PRIMARY REPAIR OF UMBILICAL HERNIA  SURGEON:  Surgeon(s): Georganna Skeans, MD  ASSISTANTS: none   ANESTHESIA:   local and general  EBL:  Total I/O In: 1000 [I.V.:1000] Out: 5 [Blood:5]  BLOOD ADMINISTERED:none  DRAINS: none   SPECIMEN:  Excision  DISPOSITION OF SPECIMEN:  PATHOLOGY  COUNTS:  YES  DICTATION: .Dragon Dictation Findings: Acute suppurative appendicitis, small umbilical hernia  Procedure in detail. Kenneth [name] is brought for appendectomy. He was identified in the preop holding area. Informed consent was obtained. He received intravenous antibiotics. He was brought to the operating room and general endotracheal anesthesia was administered by the anesthesia staff. His abdomen was prepped and draped in sterile fashion. Time out procedure was performed.The infraumbilical region was infiltrated with local. Infraumbilical incision was made. Subcutaneous tissues were dissected down revealing the anterior fascia. This was divided sharply along the midline. Small umbilical hernia was circumferentially dissected. The hernia sac was freed up off of the umbilical skin and excised. Peritoneal cavity was entered under direct vision through the hernia defect without complication. A 0 Vicryl pursestring was placed around the fascial opening. Hassan trocar was inserted into the abdomen. The abdomen was insufflated with carbon dioxide in standard fashion. Under direct vision a 12 mm left lower quadrant and a 5 mm right mid abdomen port were placed. Local was used at each port site. The appendix was acutely inflamed but not perforated. The mesoappendix was divided with the harmonic scalpel achieving excellent hemostasis. The base the appendix was divided with an Endo GIA with  vascular load. There was excellent staple line in good hemostasis. The appendix was placed in a bag and removed from the abdomen. It was sent to pathology. The abdomen was copiously irrigated with saline. Irrigation fluid returned clear. Staple line was intact and dry. Pneumoperitoneum was released ports were removed. The umbilical hernia defect at the umbilicus was repaired with interrupted 0 Novafil sutures. All 3 wounds were copiously irrigated and the skin of each was closed with running 4-0 Vicryl subcuticular followed by Dermabond. All counts were correct. He tolerated procedure well without apparent complication was taken recovery in stable condition.  PATIENT DISPOSITION:  PACU - hemodynamically stable.   Delay start of Pharmacological VTE agent (>24hrs) due to surgical blood loss or risk of bleeding:  no  Georganna Skeans, MD, MPH, FACS Pager: [phone]  4/18/20184:20 PM

## 2017-04-06 NOTE — Progress Notes (Signed)
New Admission Note:  Arrival Method: On stretcher from ED Mental Orientation: Alert & Oriented x4 Telemetry: N/A Assessment: Completed Skin: Refer to flowsheet. IV: Right AC Pain: 2/10 Tubes: None Safety Measures: Safety Fall Prevention Plan discussed with patient. Admission: Completed [facility] Orientation: Patient has been orientated to the room, unit and the staff. Family: Wife at bedside.  Orders have been reviewed and implemented. Will continue to monitor the patient. Call light has been placed within reach. Vassie Moselle, RN  Phone Number: [phone]

## 2017-04-06 NOTE — Anesthesia Procedure Notes (Signed)
Procedure Name: Intubation Date/Time: 04/06/2017 3:32 PM Performed by: Candis Shine Pre-anesthesia Checklist: Patient identified, Emergency Drugs available, Suction available and Patient being monitored Patient Re-evaluated:Patient Re-evaluated prior to inductionOxygen Delivery Method: Circle System Utilized Preoxygenation: Pre-oxygenation with 100% oxygen Intubation Type: IV induction and Rapid sequence Laryngoscope Size: Mac and 4 Grade View: Grade I Tube type: Oral Tube size: 7.5 mm Number of attempts: 1 Airway Equipment and Method: Stylet Placement Confirmation: ETT inserted through vocal cords under direct vision,  positive ETCO2 and breath sounds checked- equal and bilateral Secured at: 23 cm Tube secured with: Tape Dental Injury: Teeth and Oropharynx as per pre-operative assessment

## 2017-04-06 NOTE — Anesthesia Postprocedure Evaluation (Signed)
Anesthesia Post Note  Patient: Kenneth [name]  Procedure(s) Performed: Procedure(s) (LRB): APPENDECTOMY LAPAROSCOPIC with primary repair of umbilical hernia (N/A)  Patient location during evaluation: PACU Anesthesia Type: General Level of consciousness: sedated and patient cooperative Pain management: pain level controlled Vital Signs Assessment: post-procedure vital signs reviewed and stable Respiratory status: spontaneous breathing Cardiovascular status: stable Anesthetic complications: no       Last Vitals:  Vitals:   04/06/17 1715 04/06/17 1730  BP: (!) 160/85 (!) 130/92  Pulse: 77 65  Resp: 14 13  Temp:      Last Pain:  Vitals:   04/06/17 1700  TempSrc:   PainSc: Asleep                 Nolon Nations

## 2017-04-06 NOTE — H&P (Signed)
Surgical H&P  CC: abdominal pain  HPI: This is a 61yo gentleman who has been having left sided abdominal pain for about 2 months, but for the last 12 hours or so has had a focal right lower quadrant pain. He feels that the pain gradually migrated and centered in the right lower quadrant around 11am yesterday. He reports associated nausea and nonbloody, nonbilious emesis. Denies diarrhea or constipation. Last bowel movement was yesterday morning and was normal. No fevers.  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 He was referred to the ER from the urgent care and CT has been performed demonstrating acute appendicitis.   He has never had any abdominal surgeries.  Quit smoking 2 weeks ago, and denies any alcohol or drug use presently.   No Known Allergies  Past Medical History:  Diagnosis Date  . Alcohol abuse   . Angina   . Chronic kidney disease   . Diabetes mellitus   . Hyperlipidemia   . Hypertension     History reviewed. No pertinent surgical history.  Family History  Problem Relation Age of Onset  . Diabetes Father   . Hypertension Father   . Alcohol abuse Father   . Diabetes Sister   . Breast cancer Mother   . Colon cancer Neg Hx   . Pancreatic cancer Neg Hx   . Stomach cancer Neg Hx     Social History   Social History  . Marital status: Single    Spouse name: N/A  . Number of children: N/A  . Years of education: N/A   Social History Main Topics  . Smoking status: Former Smoker    Packs/day: 0.50    Types: Cigarettes    Quit date: 01/30/2012  . Smokeless tobacco: Never Used  . Alcohol use 0.0 oz/week     Comment: rare  . Drug use: No  . Sexual activity: Not Asked   Other Topics Concern  . None   Social History Narrative  . None    No current facility-administered medications on file prior to encounter.    Current Outpatient Prescriptions on File Prior to Encounter   Medication Sig Dispense Refill  . acetaminophen (TYLENOL) 500 MG tablet Take 1,000 mg by mouth every 6 (six) hours as needed. For pain    . Insulin Glargine (BASAGLAR KWIKPEN) 100 UNIT/ML SOPN Inject 0.5 mLs (50 Units total) into the skin every morning. 3 mL 11  . Lancets MISC 1 each by Does not apply route 3 (three) times daily.      Marland Kitchen losartan (COZAAR) 25 MG tablet TAKE ONE TABLET BY MOUTH ONCE DAILY 30 tablet 5  . losartan (COZAAR) 25 MG tablet TAKE ONE TABLET BY MOUTH ONCE DAILY 90 tablet 1  . potassium chloride SA (KLOR-CON M20) 20 MEQ tablet Take 1 tablet (20 mEq total) by mouth daily. 30 tablet 3  . sildenafil (VIAGRA) 100 MG tablet Take 0.5-1 tablets (50-100 mg total) by mouth daily as needed for erectile dysfunction. 10 tablet 11  . simvastatin (ZOCOR) 20 MG tablet TAKE ONE TABLET BY MOUTH AT BEDTIME 90 tablet 1  . UNISTRIP1 GENERIC test strip TEST BLOOD SUGARS 4 TIMES DAILY 100 each 8    Review of Systems: a complete, 10pt review of systems was completed with pertinent positives and negatives as documented in the HPI.   Physical Exam: Vitals:   04/05/17 2009  BP: Marland Kitchen)  144/77  Pulse: 79  Resp: 18  Temp: 99.5 F (37.5 C)   Gen: A&Ox3, no distress  Head: normocephalic, atraumatic, EOMI, anicteric.  Neck: supple without mass or thyromegaly Chest: unlabored respirations symmetrical air entry  Cardiovascular: RRR with palpable distal pulses, no pedal edema Abdomen: soft, nondistended. Tender in right lower quadrant, low midline. Reducible umbilical hernia.  Extremities: warm, without edema, no deformities  Neuro: grossly intact, normal gait Psych: appropriate mood and affect, appropriate insight Skin: no lesions or rashes on limited skin exam   CBC Latest Ref Rng & Units 04/05/2017 08/02/2016 05/23/2015  WBC 4.0 - 10.5 K/uL 13.1(H) 6.1 5.8  Hemoglobin 13.0 - 17.0 g/dL 12.9(L) 12.8(L) 13.1  Hematocrit 39.0 - 52.0 % 37.4(L) 37.4(L) 38.7(L)  Platelets 150 - 400 K/uL 236 240.0  276.0    CMP Latest Ref Rng & Units 04/05/2017 08/02/2016 05/23/2015  Glucose 65 - 99 mg/dL 126(H) 85 60(L)  BUN 6 - 20 mg/dL 7 15 12   Creatinine 0.61 - 1.24 mg/dL 1.03 0.98 1.02  Sodium 135 - 145 mmol/L 137 138 138  Potassium 3.5 - 5.1 mmol/L 4.0 4.2 4.7  Chloride 101 - 111 mmol/L 103 105 104  CO2 22 - 32 mmol/L 26 28 32  Calcium 8.9 - 10.3 mg/dL 9.8 9.2 9.4  Total Protein 6.5 - 8.1 g/dL 7.8 6.9 7.3  Total Bilirubin 0.3 - 1.2 mg/dL 0.5 0.4 0.6  Alkaline Phos 38 - 126 U/L 88 76 103  AST 15 - 41 U/L 15 10 16   ALT 17 - 63 U/L 16(L) 18 18    No results found for: INR, PROTIME  Imaging: CT ABDOMEN AND PELVIS WITH CONTRAST  TECHNIQUE: Multidetector CT imaging of the abdomen and pelvis was performed using the standard protocol following bolus administration of intravenous contrast.  CONTRAST:  139mL ISOVUE-300 IOPAMIDOL (ISOVUE-300) INJECTION 61%  COMPARISON:  Prior radiograph from 08/03/2011.  FINDINGS: Lower chest: Scattered atelectatic changes present within the visualized lung bases. Calcified granuloma noted at the right lung base. Additional 5 mm right lower lobe nodule noted (series 4, image 10), indeterminate.  Hepatobiliary: Liver demonstrates a normal contrast enhanced appearance. Gallbladder within normal limits. No biliary dilatation.  Pancreas: Pancreas within normal limits.  Spleen: Spleen within normal limits.  Adrenals/Urinary Tract: Adrenal glands are normal. Kidneys equal in size with symmetric enhancement. Subcentimeter hypodensity within the right kidney too small the characterize, but statistically likely reflects a small cyst. No nephrolithiasis, hydronephrosis, or focal enhancing renal mass. No hydroureter. Partially distended bladder within normal limits.  Stomach/Bowel: Stomach within normal limits. No evidence for bowel obstruction. Appendix well visualized within the right lower quadrant, positioned anterior to the cecum (series 3,  image 61). Appendix is prominent measuring up to 10 mm in diameter. Associated mild mucosal thickening with hazy periappendiceal fat stranding. Findings concerning for acute appendicitis, overall mild in appearance at this point. No evidence for perforation or other complication.  No other acute inflammatory changes seen about the bowels. Colon is largely decompressed.  Vascular/Lymphatic: Normal intravascular enhancement seen throughout the intra-abdominal aorta and its branch vessels. Mild atheromatous plaque within the aorta itself. No aneurysm. No adenopathy.  Reproductive: Prostate within normal limits.  Other: No free air or fluid. Small bilateral fat containing inguinal hernias noted. Additional small fat containing paraumbilical hernia present as well. No associated inflammation.  Musculoskeletal: No acute osseous abnormality. No worrisome lytic or blastic osseous lesions. Bilateral facet arthrosis noted within the lower lumbar spine.  IMPRESSION: 1. Findings concerning for  acute appendicitis as above. No evidence for perforation or other complication. 2. No other acute intra-abdominal or pelvic process identified.  A/P:  61yo male with acute appendicitis. Will plan for laparoscopic appendectomy today with Dr. Grandville Silos. Discussed the surgery with him, discussed risks of bleeding, infection, pain, scarring, injury to intraabdominal structures, staple line leak or intraabdominal abscess, conversion to open surgery etc. He and his wife expressed understanding. They asked appropriate questions which were answered.  -NPO -IVF -IV abx -pain/nausea control   Romana Juniper, MD [facility] Surgery, [place] Pager [phone]

## 2017-04-06 NOTE — Progress Notes (Signed)
Day of Surgery  Subjective: CC in pre-op, RLQ pain  Objective: Vital signs in last 24 hours: Temp:  [97.5 F (36.4 C)-99.5 F (37.5 C)] 98.3 F (36.8 C) (04/18 0859) Pulse Rate:  [66-79] 66 (04/18 0859) Resp:  [16-18] 17 (04/18 0859) BP: (97-144)/(49-77) 113/66 (04/18 1300) SpO2:  [94 %-100 %] 94 % (04/18 0859) Weight:  [99.6 kg (219 lb 9.3 oz)-99.8 kg (220 lb)] 99.6 kg (219 lb 9.3 oz) (04/18 0224) Last BM Date: 04/05/17  Intake/Output from previous day: 04/17 0701 - 04/18 0700 In: 500 [I.V.:350; IV Piggyback:150] Out: 0  Intake/Output this shift: No intake/output data recorded.  General appearance: cooperative Resp: clear to auscultation bilaterally Cardio: regular rate and rhythm GI: soft, tender RLQ, UH  Lab Results:   Recent Labs  04/05/17 2011 04/06/17 0221  WBC 13.1* 13.9*  HGB 12.9* 13.1  HCT 37.4* 38.2*  PLT 236 248   BMET  Recent Labs  04/05/17 2011 04/06/17 0221  NA 137 137  K 4.0 4.3  CL 103 101  CO2 26 28  GLUCOSE 126* 116*  BUN 7 8  CREATININE 1.03 1.04  CALCIUM 9.8 9.6   PT/INR  Recent Labs  04/06/17 0221  LABPROT 14.9  INR 1.16   ABG No results for input(s): PHART, HCO3 in the last 72 hours.  Invalid input(s): PCO2, PO2  Studies/Results: Ct Abdomen Pelvis W Contrast  Result Date: 04/05/2017 CLINICAL DATA:  Initial evaluation for acute right lower quadrant abdominal pain. EXAM: CT ABDOMEN AND PELVIS WITH CONTRAST TECHNIQUE: Multidetector CT imaging of the abdomen and pelvis was performed using the standard protocol following bolus administration of intravenous contrast. CONTRAST:  117mL ISOVUE-300 IOPAMIDOL (ISOVUE-300) INJECTION 61% COMPARISON:  Prior radiograph from 08/03/2011. FINDINGS: Lower chest: Scattered atelectatic changes present within the visualized lung bases. Calcified granuloma noted at the right lung base. Additional 5 mm right lower lobe nodule noted (series 4, image 10), indeterminate. Hepatobiliary: Liver  demonstrates a normal contrast enhanced appearance. Gallbladder within normal limits. No biliary dilatation. Pancreas: Pancreas within normal limits. Spleen: Spleen within normal limits. Adrenals/Urinary Tract: Adrenal glands are normal. Kidneys equal in size with symmetric enhancement. Subcentimeter hypodensity within the right kidney too small the characterize, but statistically likely reflects a small cyst. No nephrolithiasis, hydronephrosis, or focal enhancing renal mass. No hydroureter. Partially distended bladder within normal limits. Stomach/Bowel: Stomach within normal limits. No evidence for bowel obstruction. Appendix well visualized within the right lower quadrant, positioned anterior to the cecum (series 3, image 61). Appendix is prominent measuring up to 10 mm in diameter. Associated mild mucosal thickening with hazy periappendiceal fat stranding. Findings concerning for acute appendicitis, overall mild in appearance at this point. No evidence for perforation or other complication. No other acute inflammatory changes seen about the bowels. Colon is largely decompressed. Vascular/Lymphatic: Normal intravascular enhancement seen throughout the intra-abdominal aorta and its branch vessels. Mild atheromatous plaque within the aorta itself. No aneurysm. No adenopathy. Reproductive: Prostate within normal limits. Other: No free air or fluid. Small bilateral fat containing inguinal hernias noted. Additional small fat containing paraumbilical hernia present as well. No associated inflammation. Musculoskeletal: No acute osseous abnormality. No worrisome lytic or blastic osseous lesions. Bilateral facet arthrosis noted within the lower lumbar spine. IMPRESSION: 1. Findings concerning for acute appendicitis as above. No evidence for perforation or other complication. 2. No other acute intra-abdominal or pelvic process identified. Electronically Signed   By: Jeannine Boga M.D.   On: 04/05/2017 23:47     Anti-infectives: Anti-infectives  Start     Dose/Rate Route Frequency Ordered Stop   04/06/17 0200  [MAR Hold]  metroNIDAZOLE (FLAGYL) IVPB 500 mg     (MAR Hold since 04/06/17 1332)   500 mg 100 mL/hr over 60 Minutes Intravenous Every 8 hours 04/06/17 0118     04/06/17 0130  [MAR Hold]  cefTRIAXone (ROCEPHIN) 2 g in dextrose 5 % 50 mL IVPB     (MAR Hold since 04/06/17 1332)   2 g 100 mL/hr over 30 Minutes Intravenous Daily at bedtime 04/06/17 0118        Assessment/Plan: Acute appendicitis - laparoscopic appendectomy. Procedure, risks, and benefits were discussed in detail with him. We will likely do a primary repair of his umbilical hernia at the same time. He is agreeable.  LOS: 0 days    Rosangela Fehrenbach E 04/06/2017

## 2017-04-06 NOTE — ED Notes (Signed)
Surgeon in pt room

## 2017-04-07 ENCOUNTER — Encounter (HOSPITAL_COMMUNITY): Payer: Self-pay | Admitting: General Surgery

## 2017-04-07 LAB — GLUCOSE, CAPILLARY
GLUCOSE-CAPILLARY: 100 mg/dL — AB (ref 65–99)
GLUCOSE-CAPILLARY: 115 mg/dL — AB (ref 65–99)
Glucose-Capillary: 127 mg/dL — ABNORMAL HIGH (ref 65–99)
Glucose-Capillary: 138 mg/dL — ABNORMAL HIGH (ref 65–99)

## 2017-04-07 MED ORDER — OXYCODONE HCL 5 MG PO TABS: 5.0000 mg | ORAL_TABLET | Freq: Four times a day (QID) | ORAL | 0 refills | Status: DC | PRN

## 2017-04-07 NOTE — Progress Notes (Signed)
Kenneth [name] to be D/C'd Home per MD order.  Discussed prescriptions and follow up appointments with the patient. Prescriptions given to patient, medication list explained in detail. Pt verbalized understanding.  Allergies as of 04/07/2017      Reactions   Metformin And Related Nausea And Vomiting      Medication List    TAKE these medications   acetaminophen 500 MG tablet Commonly known as:  TYLENOL Take 1,000 mg by mouth every 6 (six) hours as needed for mild pain.   BASAGLAR KWIKPEN 100 UNIT/ML Sopn Inject 0.5 mLs (50 Units total) into the skin every morning.   losartan 25 MG tablet Commonly known as:  COZAAR TAKE ONE TABLET BY MOUTH ONCE DAILY   naproxen sodium 220 MG tablet Commonly known as:  ANAPROX Take 440 mg by mouth 2 (two) times daily as needed (pain).   oxyCODONE 5 MG immediate release tablet Commonly known as:  Oxy IR/ROXICODONE Take 1-2 tablets (5-10 mg total) by mouth every 6 (six) hours as needed for moderate pain or severe pain.   potassium chloride SA 20 MEQ tablet Commonly known as:  KLOR-CON M20 Take 1 tablet (20 mEq total) by mouth daily.   sildenafil 100 MG tablet Commonly known as:  VIAGRA Take 0.5-1 tablets (50-100 mg total) by mouth daily as needed for erectile dysfunction.   simvastatin 20 MG tablet Commonly known as:  ZOCOR TAKE ONE TABLET BY MOUTH AT BEDTIME   UNISTRIP1 GENERIC test strip Generic drug:  glucose blood TEST BLOOD SUGARS 4 TIMES DAILY       Vitals:   04/07/17 0916 04/07/17 0943  BP: (!) 118/50 127/68  Pulse: 81 (!) 9  Resp: 18 20  Temp: 98.9 F (37.2 C) 98.9 F (37.2 C)    Skin clean, dry and intact without evidence of skin break down, no evidence of skin tears noted. IV catheter discontinued intact. Site without signs and symptoms of complications. Dressing and pressure applied. Pt denies pain at this time. No complaints noted.  An After Visit Summary was printed and given to the patient. Patient escorted via  [place], and D/C home via private auto.  Kenneth Math, RN [facility] Phone [phone]

## 2017-04-07 NOTE — Discharge Summary (Signed)
[place] [facility] Discharge Summary   Patient ID: Kenneth [name] MRN: [id number] DOB/AGE: Mar 18, 1956 61 y.o.  Admit date: 04/05/2017 Discharge date: 04/07/2017  Discharge Diagnosis Patient Active Problem List   Diagnosis Date Noted  . Acute appendicitis   . Umbilical hernia 79/39/0300  . 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 Tobacco abuse 02/05/2012  . Hyperlipidemia 10/28/2011  . Essential hypertension 09/29/2007    Consultants None   Imaging: Ct Abdomen Pelvis W Contrast  Result Date: 04/05/2017 CLINICAL DATA:  Initial evaluation for acute right lower quadrant abdominal pain. EXAM: CT ABDOMEN AND PELVIS WITH CONTRAST TECHNIQUE: Multidetector CT imaging of the abdomen and pelvis was performed using the standard protocol following bolus administration of intravenous contrast. CONTRAST:  177mL ISOVUE-300 IOPAMIDOL (ISOVUE-300) INJECTION 61% COMPARISON:  Prior radiograph from 08/03/2011. FINDINGS: Lower chest: Scattered atelectatic changes present within the visualized lung bases. Calcified granuloma noted at the right lung base. Additional 5 mm right lower lobe nodule noted (series 4, image 10), indeterminate. Hepatobiliary: Liver demonstrates a normal contrast enhanced appearance. Gallbladder within normal limits. No biliary dilatation. Pancreas: Pancreas within normal limits. Spleen: Spleen within normal limits. Adrenals/Urinary Tract: Adrenal glands are normal. Kidneys equal in size with symmetric enhancement. Subcentimeter hypodensity within the right kidney too small the characterize, but statistically likely reflects a small cyst. No nephrolithiasis, hydronephrosis, or focal enhancing renal mass. No hydroureter. Partially distended bladder within normal limits. Stomach/Bowel: Stomach within normal limits. No evidence for bowel obstruction. Appendix well visualized within the right lower quadrant, positioned anterior to the cecum (series 3, image  61). Appendix is prominent measuring up to 10 mm in diameter. Associated mild mucosal thickening with hazy periappendiceal fat stranding. Findings concerning for acute appendicitis, overall mild in appearance at this point. No evidence for perforation or other complication. No other acute inflammatory changes seen about the bowels. Colon is largely decompressed. Vascular/Lymphatic: Normal intravascular enhancement seen throughout the intra-abdominal aorta and its branch vessels. Mild atheromatous plaque within the aorta itself. No aneurysm. No adenopathy. Reproductive: Prostate within normal limits. Other: No free air or fluid. Small bilateral fat containing inguinal hernias noted. Additional small fat containing paraumbilical hernia present as well. No associated inflammation. Musculoskeletal: No acute osseous abnormality. No worrisome lytic or blastic osseous lesions. Bilateral facet arthrosis noted within the lower lumbar spine. IMPRESSION: 1. Findings concerning for acute appendicitis as above. No evidence for perforation or other complication. 2. No other acute intra-abdominal or pelvic process identified. Electronically Signed   By: Jeannine Boga M.D.   On: 04/05/2017 23:47    Procedures Dr. Georganna Skeans (04/06/17) - Laparoscopic Appendectomy, primary repair of umbilical hernia   Hospital Course:  61 y.o. male who presented to [facility] with 12 hours of focal right lower abdominal pain.  Workup showed leukocytosis and CT scan above consistent with acute non-ruptured appendicitis.  Patient was admitted and underwent procedure listed above.  Tolerated procedure well and was transferred to the floor.  Diet was advanced as tolerated.  On POD#1, the patient was voiding well, tolerating diet, ambulating well, pain well controlled, vital signs stable, incisions c/d/i and felt stable for discharge home.  Patient will follow up in our office in 2-4 weeks and knows to call with questions or concerns.   I  have personally reviewed the patients medication history on the [place] controlled substance database.  Physical Exam: General:  Alert, NAD, pleasant, comfortable Abd:  Soft, non-distended, appropriately tender, incisions C/D/I  Allergies as of 04/07/2017  Reactions   Metformin And Related Nausea And Vomiting      Medication List    TAKE these medications   acetaminophen 500 MG tablet Commonly known as:  TYLENOL Take 1,000 mg by mouth every 6 (six) hours as needed for mild pain.   BASAGLAR KWIKPEN 100 UNIT/ML Sopn Inject 0.5 mLs (50 Units total) into the skin every morning.   losartan 25 MG tablet Commonly known as:  COZAAR TAKE ONE TABLET BY MOUTH ONCE DAILY   naproxen sodium 220 MG tablet Commonly known as:  ANAPROX Take 440 mg by mouth 2 (two) times daily as needed (pain).   oxyCODONE 5 MG immediate release tablet Commonly known as:  Oxy IR/ROXICODONE Take 1-2 tablets (5-10 mg total) by mouth every 6 (six) hours as needed for moderate pain or severe pain.   potassium chloride SA 20 MEQ tablet Commonly known as:  KLOR-CON M20 Take 1 tablet (20 mEq total) by mouth daily.   sildenafil 100 MG tablet Commonly known as:  VIAGRA Take 0.5-1 tablets (50-100 mg total) by mouth daily as needed for erectile dysfunction.   simvastatin 20 MG tablet Commonly known as:  ZOCOR TAKE ONE TABLET BY MOUTH AT BEDTIME   UNISTRIP1 GENERIC test strip Generic drug:  glucose blood TEST BLOOD SUGARS 4 TIMES DAILY         Signed: Obie Dredge, [facility] 04/07/2017, 8:51 AM Pager: [phone] Consults: [phone] Mon-Fri 7:00 am-4:30 pm Sat-Sun 7:00 am-11:30 am

## 2017-04-07 NOTE — Discharge Instructions (Signed)

## 2017-04-20 ENCOUNTER — Encounter: Payer: Self-pay | Admitting: Endocrinology

## 2017-04-20 ENCOUNTER — Ambulatory Visit (INDEPENDENT_AMBULATORY_CARE_PROVIDER_SITE_OTHER): Payer: BC Managed Care – PPO | Admitting: Endocrinology

## 2017-04-20 VITALS — BP 118/78 | HR 63 | Ht 71.0 in | Wt 215.0 lb

## 2017-04-20 DIAGNOSIS — E1022 Type 1 diabetes mellitus with diabetic chronic kidney disease: Secondary | ICD-10-CM

## 2017-04-20 LAB — POCT GLYCOSYLATED HEMOGLOBIN (HGB A1C): HEMOGLOBIN A1C: 6.1

## 2017-04-20 MED ORDER — BASAGLAR KWIKPEN 100 UNIT/ML ~~LOC~~ SOPN: 45.0000 [IU] | PEN_INJECTOR | SUBCUTANEOUS | 11 refills | Status: DC

## 2017-04-20 NOTE — Patient Instructions (Addendum)
check your blood sugar twice a day: vary the time of day, between before the 3 meals, and at bedtime.  also check if you have symptoms of your blood sugar being too high or too low.  please keep a record of the readings and bring it to your next appointment here.  You can write it on any piece of paper.  please call us sooner if your blood sugar goes below 70, or if you have a lot of readings over 200.   Please reduce the insulin to 45 units each morning.   Please come back for a follow-up appointment in 3-4 months.   On this type of insulin schedule, you should eat meals on a regular schedule.  If a meal is missed or significantly delayed, your blood sugar could go low.

## 2017-04-20 NOTE — Progress Notes (Signed)
Subjective:    Patient ID: Kenneth [name], male    DOB: [birth year], 61 y.o.   MRN: [id number]  HPI Pt returns for f/u of diabetes mellitus: DM type: 1 Dx'ed: 6144 Complications: renal insufficiency.  Therapy: insulin since dx DKA: twice: at the time of dx, and approx 2011.    Pancreatitis: never.   Other: pt says DKA episodes were related to alcoholism; he no longer consumes EtOH; in early 2015, he was changed to a qd insulin regimen, due to poor results with multiple daily injections; he works 2nd shift, setting up for events.   Interval history: Pt says he never misses the insulin. no cbg record, but states cbg's are well-controlled.  There is no trend throughout the day.  pt states he feels well in general.  Past Medical History:  Diagnosis Date  . Alcohol abuse   . Angina   . Chronic kidney disease   . Diabetes mellitus   . Hyperlipidemia   . Hypertension     Past Surgical History:  Procedure Laterality Date  . LAPAROSCOPIC APPENDECTOMY N/A 04/06/2017   Procedure: APPENDECTOMY LAPAROSCOPIC with primary repair of umbilical hernia;  Surgeon: Georganna Skeans, MD;  Location: [facility];  Service: General;  Laterality: N/A;    Social History   Social History  . Marital status: Single    Spouse name: N/A  . Number of children: N/A  . Years of education: N/A   Occupational History  . Not on file.   Social History Main Topics  . Smoking status: Former Smoker    Packs/day: 0.50    Types: Cigarettes    Quit date: 01/30/2012  . Smokeless tobacco: Never Used  . Alcohol use 0.0 oz/week     Comment: rare  . Drug use: No  . Sexual activity: Not on file   Other Topics Concern  . Not on file   Social History Narrative  . No narrative on file    Current Outpatient Prescriptions on File Prior to Visit  Medication Sig Dispense Refill  . acetaminophen (TYLENOL) 500 MG tablet Take 1,000 mg by mouth every 6 (six) hours as needed for mild pain.     Marland Kitchen losartan (COZAAR) 25 MG  tablet TAKE ONE TABLET BY MOUTH ONCE DAILY 30 tablet 5  . naproxen sodium (ANAPROX) 220 MG tablet Take 440 mg by mouth 2 (two) times daily as needed (pain).    . simvastatin (ZOCOR) 20 MG tablet TAKE ONE TABLET BY MOUTH AT BEDTIME 90 tablet 1  . UNISTRIP1 GENERIC test strip TEST BLOOD SUGARS 4 TIMES DAILY 100 each 8  . oxyCODONE (OXY IR/ROXICODONE) 5 MG immediate release tablet Take 1-2 tablets (5-10 mg total) by mouth every 6 (six) hours as needed for moderate pain or severe pain. (Patient not taking: Reported on 04/20/2017) 15 tablet 0  . potassium chloride SA (KLOR-CON M20) 20 MEQ tablet Take 1 tablet (20 mEq total) by mouth daily. 30 tablet 3  . sildenafil (VIAGRA) 100 MG tablet Take 0.5-1 tablets (50-100 mg total) by mouth daily as needed for erectile dysfunction. 10 tablet 11   No current facility-administered medications on file prior to visit.     Allergies  Allergen Reactions  . Metformin And Related Nausea And Vomiting    Family History  Problem Relation Age of Onset  . Diabetes Father   . Hypertension Father   . Alcohol abuse Father   . Diabetes Sister   . Breast cancer Mother   . Colon cancer  Neg Hx   . Pancreatic cancer Neg Hx   . Stomach cancer Neg Hx     BP 118/78   Pulse 63   Ht 5\' 11"  (1.803 m)   Wt 215 lb (97.5 kg)   SpO2 92%   BMI 29.99 kg/m    Review of Systems He denies hypoglycemia.     Objective:   Physical Exam VITAL SIGNS:  See vs page GENERAL: no distress Pulses: dorsalis pedis intact bilat.   MSK: no deformity of the feet CV: no leg edema Skin:  no ulcer on the feet.  normal color and temp on the feet.  Neuro: sensation is intact to touch on the feet.  Ext: There is bilateral onychomycosis of the toenails.     a1c=6.1%    Assessment & Plan:  Type 1 DM, with renal insuff: overcontrolled.   Patient Instructions  check your blood sugar twice a day: vary the time of day, between before the 3 meals, and at bedtime.  also check if you have  symptoms of your blood sugar being too high or too low.  please keep a record of the readings and bring it to your next appointment here.  You can write it on any piece of paper.  please call us sooner if your blood sugar goes below 70, or if you have a lot of readings over 200.   Please reduce the insulin to 45 units each morning.   Please come back for a follow-up appointment in 3-4 months.   On this type of insulin schedule, you should eat meals on a regular schedule.  If a meal is missed or significantly delayed, your blood sugar could go low.

## 2017-04-21 ENCOUNTER — Other Ambulatory Visit: Payer: Self-pay | Admitting: Endocrinology

## 2017-04-22 ENCOUNTER — Other Ambulatory Visit: Payer: Self-pay

## 2017-04-22 MED ORDER — BASAGLAR KWIKPEN 100 UNIT/ML ~~LOC~~ SOPN: 45.0000 [IU] | PEN_INJECTOR | SUBCUTANEOUS | 5 refills | Status: DC

## 2017-05-20 ENCOUNTER — Other Ambulatory Visit: Payer: Self-pay | Admitting: Endocrinology

## 2017-05-20 NOTE — Telephone Encounter (Signed)
**  Remind patient they can make refill requests via MyChart**  Medication refill request (Name & Dosage):  Insulin Glargine (BASAGLAR KWIKPEN) 100 UNIT/ML SOPN  Preferred pharmacy (Name & Address):   [facility] ([facility]), [place] [address]  Other comments (if applicable):   [facility] told him the script was not sent a few weeks ago. Please contact the pharmacy to review.

## 2017-08-20 ENCOUNTER — Other Ambulatory Visit: Payer: Self-pay | Admitting: Family Medicine

## 2017-08-23 ENCOUNTER — Ambulatory Visit (INDEPENDENT_AMBULATORY_CARE_PROVIDER_SITE_OTHER): Payer: BC Managed Care – PPO | Admitting: Endocrinology

## 2017-08-23 ENCOUNTER — Encounter: Payer: Self-pay | Admitting: Endocrinology

## 2017-08-23 VITALS — BP 112/72 | HR 59 | Wt 221.8 lb

## 2017-08-23 DIAGNOSIS — E1022 Type 1 diabetes mellitus with diabetic chronic kidney disease: Secondary | ICD-10-CM

## 2017-08-23 LAB — POCT GLYCOSYLATED HEMOGLOBIN (HGB A1C): Hemoglobin A1C: 8.4

## 2017-08-23 MED ORDER — BASAGLAR KWIKPEN 100 UNIT/ML ~~LOC~~ SOPN: 50.0000 [IU] | PEN_INJECTOR | SUBCUTANEOUS | 5 refills | Status: DC

## 2017-08-23 NOTE — Progress Notes (Signed)
Subjective:    Patient ID: Kenneth [name], male    DOB: 1956-03-20, 61 y.o.   MRN: [id number]  HPI Pt returns for f/u of diabetes mellitus: DM type: 1 Dx'ed: 2778 Complications: renal insufficiency.  Therapy: insulin since dx DKA: twice: at the time of dx, and approx 2011.    Pancreatitis: never.   Other: pt says DKA episodes were related to alcoholism; he no longer consumes EtOH; in early 2015, he was changed to a qd insulin regimen, due to poor results with multiple daily injections; he works 2nd shift, setting up for events.   Interval history: Pt says he never misses the insulin. no cbg record, but states cbg's are well-controlled.  There is no trend throughout the day.  pt states he feels well in general.  Past Medical History:  Diagnosis Date  . Alcohol abuse   . Angina   . Chronic kidney disease   . Diabetes mellitus   . Hyperlipidemia   . Hypertension     Past Surgical History:  Procedure Laterality Date  . LAPAROSCOPIC APPENDECTOMY N/A 04/06/2017   Procedure: APPENDECTOMY LAPAROSCOPIC with primary repair of umbilical hernia;  Surgeon: Georganna Skeans, MD;  Location: [facility];  Service: General;  Laterality: N/A;    Social History   Social History  . Marital status: Single    Spouse name: N/A  . Number of children: N/A  . Years of education: N/A   Occupational History  . Not on file.   Social History Main Topics  . Smoking status: Former Smoker    Packs/day: 0.50    Types: Cigarettes    Quit date: 01/30/2012  . Smokeless tobacco: Never Used  . Alcohol use 0.0 oz/week     Comment: rare  . Drug use: No  . Sexual activity: Not on file   Other Topics Concern  . Not on file   Social History Narrative  . No narrative on file    Current Outpatient Prescriptions on File Prior to Visit  Medication Sig Dispense Refill  . acetaminophen (TYLENOL) 500 MG tablet Take 1,000 mg by mouth every 6 (six) hours as needed for mild pain.     Marland Kitchen losartan (COZAAR) 25 MG  tablet TAKE ONE TABLET BY MOUTH ONCE DAILY 30 tablet 5  . naproxen sodium (ANAPROX) 220 MG tablet Take 440 mg by mouth 2 (two) times daily as needed (pain).    . potassium chloride SA (KLOR-CON M20) 20 MEQ tablet Take 1 tablet (20 mEq total) by mouth daily. 30 tablet 3  . sildenafil (VIAGRA) 100 MG tablet Take 0.5-1 tablets (50-100 mg total) by mouth daily as needed for erectile dysfunction. 10 tablet 11  . simvastatin (ZOCOR) 20 MG tablet TAKE ONE TABLET BY MOUTH AT BEDTIME 90 tablet 1  . UNISTRIP1 GENERIC test strip TEST BLOOD SUGARS 4 TIMES DAILY 100 each 8   No current facility-administered medications on file prior to visit.     Allergies  Allergen Reactions  . Metformin And Related Nausea And Vomiting    Family History  Problem Relation Age of Onset  . Diabetes Father   . Hypertension Father   . Alcohol abuse Father   . Diabetes Sister   . Breast cancer Mother   . Colon cancer Neg Hx   . Pancreatic cancer Neg Hx   . Stomach cancer Neg Hx     BP 112/72   Pulse (!) 59   Wt 221 lb 12.8 oz (100.6 kg)   SpO2  98%   BMI 30.93 kg/m    Review of Systems He denies hypoglycemia    Objective:   Physical Exam VITAL SIGNS:  See vs page GENERAL: no distress Pulses: foot pulses are intact bilaterally.   MSK: no deformity of the feet or ankles.  CV: no edema of the legs or ankles Skin:  no ulcer on the feet or ankles.  normal color and temp on the feet and ankles Neuro: sensation is intact to touch on the feet and ankles.   Ext: There is bilateral onychomycosis of the toenails.    Lab Results  Component Value Date   HGBA1C 8.4 08/23/2017      Assessment & Plan:  Type 1 DM: worse.  Patient Instructions  check your blood sugar twice a day: vary the time of day, between before the 3 meals, and at bedtime.  also check if you have symptoms of your blood sugar being too high or too low.  please keep a record of the readings and bring it to your next appointment here.  You  can write it on any piece of paper.  please call us sooner if your blood sugar goes below 70, or if you have a lot of readings over 200.   Please increase the insulin back up to 50 units each morning.   Please come back for a follow-up appointment in 3 months.   On this type of insulin schedule, you should eat meals on a regular schedule.  If a meal is missed or significantly delayed, your blood sugar could go low.

## 2017-08-23 NOTE — Patient Instructions (Addendum)
check your blood sugar twice a day: vary the time of day, between before the 3 meals, and at bedtime.  also check if you have symptoms of your blood sugar being too high or too low.  please keep a record of the readings and bring it to your next appointment here.  You can write it on any piece of paper.  please call us sooner if your blood sugar goes below 70, or if you have a lot of readings over 200.   Please increase the insulin back up to 50 units each morning.   Please come back for a follow-up appointment in 3 months.   On this type of insulin schedule, you should eat meals on a regular schedule.  If a meal is missed or significantly delayed, your blood sugar could go low.

## 2017-10-12 ENCOUNTER — Other Ambulatory Visit: Payer: Self-pay | Admitting: Family Medicine

## 2017-10-13 ENCOUNTER — Encounter: Payer: Self-pay | Admitting: Family Medicine

## 2017-10-13 ENCOUNTER — Ambulatory Visit (INDEPENDENT_AMBULATORY_CARE_PROVIDER_SITE_OTHER): Payer: BC Managed Care – PPO | Admitting: Family Medicine

## 2017-10-13 VITALS — BP 131/72 | HR 64 | Temp 98.3°F | Ht 71.0 in | Wt 220.0 lb

## 2017-10-13 DIAGNOSIS — Z125 Encounter for screening for malignant neoplasm of prostate: Secondary | ICD-10-CM | POA: Diagnosis not present

## 2017-10-13 DIAGNOSIS — Z0000 Encounter for general adult medical examination without abnormal findings: Secondary | ICD-10-CM | POA: Diagnosis not present

## 2017-10-13 DIAGNOSIS — Z23 Encounter for immunization: Secondary | ICD-10-CM | POA: Diagnosis not present

## 2017-10-13 LAB — BASIC METABOLIC PANEL
BUN: 11 mg/dL (ref 6–23)
CALCIUM: 9.4 mg/dL (ref 8.4–10.5)
CO2: 33 meq/L — AB (ref 19–32)
CREATININE: 0.97 mg/dL (ref 0.40–1.50)
Chloride: 103 mEq/L (ref 96–112)
GFR: 101.93 mL/min (ref >60.00)
GLUCOSE: 120 mg/dL — AB (ref 70–99)
Potassium: 3.8 mEq/L (ref 3.5–5.1)
Sodium: 140 mEq/L (ref 135–145)

## 2017-10-13 LAB — POC URINALSYSI DIPSTICK (AUTOMATED)
BILIRUBIN UA: NEGATIVE
Glucose, UA: NEGATIVE
KETONES UA: NEGATIVE
Leukocytes, UA: NEGATIVE
Nitrite, UA: NEGATIVE
PROTEIN UA: NEGATIVE
RBC UA: NEGATIVE
SPEC GRAV UA: 1.02 (ref 1.010–1.025)
Urobilinogen, UA: 0.2 E.U./dL
pH, UA: 6 (ref 5.0–8.0)

## 2017-10-13 LAB — HEPATIC FUNCTION PANEL
ALBUMIN: 4.1 g/dL (ref 3.5–5.2)
ALT: 16 U/L (ref 0–53)
AST: 14 U/L (ref 0–37)
Alkaline Phosphatase: 92 U/L (ref 39–117)
BILIRUBIN DIRECT: 0.1 mg/dL (ref 0.0–0.3)
TOTAL PROTEIN: 7.2 g/dL (ref 6.0–8.3)
Total Bilirubin: 0.6 mg/dL (ref 0.2–1.2)

## 2017-10-13 LAB — CBC WITH DIFFERENTIAL/PLATELET
Basophils Absolute: 0 10*3/uL (ref 0.0–0.1)
Basophils Relative: 0.4 % (ref 0.0–3.0)
EOS ABS: 0.1 10*3/uL (ref 0.0–0.7)
EOS PCT: 1.5 % (ref 0.0–5.0)
HCT: 37.1 % — ABNORMAL LOW (ref 39.0–52.0)
HEMOGLOBIN: 12.3 g/dL — AB (ref 13.0–17.0)
Lymphocytes Relative: 35.2 % (ref 12.0–46.0)
Lymphs Abs: 2.2 10*3/uL (ref 0.7–4.0)
MCHC: 33.3 g/dL (ref 30.0–36.0)
MCV: 92.6 fl (ref 78.0–100.0)
MONO ABS: 0.5 10*3/uL (ref 0.1–1.0)
Monocytes Relative: 8.9 % (ref 3.0–12.0)
Neutro Abs: 3.3 10*3/uL (ref 1.4–7.7)
Neutrophils Relative %: 54 % (ref 43.0–77.0)
Platelets: 244 10*3/uL (ref 150.0–400.0)
RBC: 4.01 Mil/uL — AB (ref 4.22–5.81)
RDW: 14 % (ref 11.5–15.5)
WBC: 6.1 10*3/uL (ref 4.0–10.5)

## 2017-10-13 LAB — LIPID PANEL
CHOLESTEROL: 115 mg/dL (ref 0–200)
HDL: 42 mg/dL (ref >39.00)
LDL CALC: 65 mg/dL (ref 0–99)
NonHDL: 73.39
Total CHOL/HDL Ratio: 3
Triglycerides: 44 mg/dL (ref 0.0–149.0)
VLDL: 8.8 mg/dL (ref 0.0–40.0)

## 2017-10-13 LAB — TSH: TSH: 2.32 u[IU]/mL (ref 0.35–4.50)

## 2017-10-13 LAB — PSA: PSA: 2.2 ng/mL (ref 0.10–4.00)

## 2017-10-13 MED ORDER — SIMVASTATIN 20 MG PO TABS: 20.0000 mg | ORAL_TABLET | Freq: Every day | ORAL | 3 refills | Status: DC

## 2017-10-13 MED ORDER — LOSARTAN POTASSIUM 25 MG PO TABS: 25.0000 mg | ORAL_TABLET | Freq: Every day | ORAL | 3 refills | Status: DC

## 2017-10-13 NOTE — Patient Instructions (Signed)
WE NOW OFFER   [facility] FAST TRACK!!!  SAME DAY Appointments for ACUTE CARE  Such as: Sprains, Injuries, cuts, abrasions, rashes, muscle pain, joint pain, back pain Colds, flu, sore throats, headache, allergies, cough, fever  Ear pain, sinus and eye infections Abdominal pain, nausea, vomiting, diarrhea, upset stomach Animal/insect bites  3 Easy Ways to Schedule: Walk-In Scheduling Call in scheduling Mychart Sign-up: [email or web address]         

## 2017-10-13 NOTE — Progress Notes (Signed)
   Subjective:    Patient ID: Kenneth [name], male    DOB: [birth year], 61 y.o.   MRN: [id number]  HPI Here for a well exam. He feels fine. He sees Dr. Loanne Drilling for his diabetes, and his last A1c in September was 8.4. His insulin was adjusted at that time.    Review of Systems     Objective:   Physical Exam  Constitutional: He is oriented to person, place, and time. He appears well-developed and well-nourished. No distress.  HENT:  Head: Normocephalic and atraumatic.  Right Ear: External ear normal.  Left Ear: External ear normal.  Nose: Nose normal.  Mouth/Throat: Oropharynx is clear and moist. No oropharyngeal exudate.  Eyes: Pupils are equal, round, and reactive to light. Conjunctivae and EOM are normal. Right eye exhibits no discharge. Left eye exhibits no discharge. No scleral icterus.  Neck: Neck supple. No JVD present. No tracheal deviation present. No thyromegaly present.  Cardiovascular: Normal rate, regular rhythm, normal heart sounds and intact distal pulses.  Exam reveals no gallop and no friction rub.   No murmur heard. Pulmonary/Chest: Effort normal and breath sounds normal. No respiratory distress. He has no wheezes. He has no rales. He exhibits no tenderness.  Abdominal: Soft. Bowel sounds are normal. He exhibits no distension and no mass. There is no tenderness. There is no rebound and no guarding.  Genitourinary: Rectum normal, prostate normal and penis normal. Rectal exam shows guaiac negative stool. No penile tenderness.  Musculoskeletal: Normal range of motion. He exhibits no edema or tenderness.  Lymphadenopathy:    He has no cervical adenopathy.  Neurological: He is alert and oriented to person, place, and time. He has normal reflexes. No cranial nerve deficit. He exhibits normal muscle tone. Coordination normal.  Skin: Skin is warm and dry. No rash noted. He is not diaphoretic. No erythema. No pallor.  Psychiatric: He has a normal mood and affect. His behavior  is normal. Judgment and thought content normal.          Assessment & Plan:  Well exam. We discussed diet and exercise. Get fasting labs. Alysia Penna, MD

## 2017-11-21 ENCOUNTER — Encounter: Payer: Self-pay | Admitting: Endocrinology

## 2017-11-21 ENCOUNTER — Ambulatory Visit (INDEPENDENT_AMBULATORY_CARE_PROVIDER_SITE_OTHER): Payer: BC Managed Care – PPO | Admitting: Endocrinology

## 2017-11-21 VITALS — BP 126/90 | HR 67 | Wt 221.8 lb

## 2017-11-21 DIAGNOSIS — E1022 Type 1 diabetes mellitus with diabetic chronic kidney disease: Secondary | ICD-10-CM | POA: Diagnosis not present

## 2017-11-21 LAB — POCT GLYCOSYLATED HEMOGLOBIN (HGB A1C): HEMOGLOBIN A1C: 7.8

## 2017-11-21 NOTE — Progress Notes (Signed)
Subjective:    Patient ID: Kenneth [name], male    DOB: Feb 06, 1956, 61 y.o.   MRN: [id number]  HPI Pt returns for f/u of diabetes mellitus: DM type: 1 Dx'ed: 8099 Complications: renal insufficiency.  Therapy: insulin since dx DKA: twice: at the time of dx, and approx 2011.    Pancreatitis: never.   Other: pt says DKA episodes were related to alcoholism; he no longer consumes EtOH; in early 2015, he was changed to a qd insulin regimen, due to poor results with multiple daily injections; he works 2nd shift, setting up for events.   Interval history: Pt says he never misses the insulin. no cbg record, but states cbg's are well-controlled.  There is no trend throughout the day.  pt states he feels well in general.   Past Medical History:  Diagnosis Date  . Alcohol abuse   . Angina   . Chronic kidney disease   . Diabetes mellitus   . Hyperlipidemia   . Hypertension     Past Surgical History:  Procedure Laterality Date  . COLONOSCOPY  05/06/2014   per Dr. Henrene Pastor, clear, repeat in 10 yrs   . LAPAROSCOPIC APPENDECTOMY N/A 04/06/2017   Procedure: APPENDECTOMY LAPAROSCOPIC with primary repair of umbilical hernia;  Surgeon: Georganna Skeans, MD;  Location: [facility] OR;  Service: General;  Laterality: N/A;    Social History   Socioeconomic History  . Marital status: Single    Spouse name: Not on file  . Number of children: Not on file  . Years of education: Not on file  . Highest education level: Not on file  Social Needs  . Financial resource strain: Not on file  . Food insecurity - worry: Not on file  . Food insecurity - inability: Not on file  . Transportation needs - medical: Not on file  . Transportation needs - non-medical: Not on file  Occupational History  . Not on file  Tobacco Use  . Smoking status: Former Smoker    Packs/day: 0.50    Types: Cigarettes    Last attempt to quit: 01/30/2012    Years since quitting: 5.8  . Smokeless tobacco: Never Used  Substance and Sexual  Activity  . Alcohol use: Yes    Alcohol/week: 0.0 oz    Comment: rare  . Drug use: No  . Sexual activity: Not on file  Other Topics Concern  . Not on file  Social History Narrative  . Not on file    Current Outpatient Medications on File Prior to Visit  Medication Sig Dispense Refill  . acetaminophen (TYLENOL) 500 MG tablet Take 1,000 mg by mouth every 6 (six) hours as needed for mild pain.     . Insulin Glargine (BASAGLAR KWIKPEN) 100 UNIT/ML SOPN Inject 0.5 mLs (50 Units total) into the skin every morning. And pen needles 1/day 5 pen 5  . losartan (COZAAR) 25 MG tablet Take 1 tablet (25 mg total) by mouth daily. 90 tablet 3  . naproxen sodium (ANAPROX) 220 MG tablet Take 440 mg by mouth 2 (two) times daily as needed (pain).    . simvastatin (ZOCOR) 20 MG tablet Take 1 tablet (20 mg total) by mouth at bedtime. 90 tablet 3  . UNISTRIP1 GENERIC test strip TEST BLOOD SUGARS 4 TIMES DAILY 100 each 8   No current facility-administered medications on file prior to visit.     Allergies  Allergen Reactions  . Metformin And Related Nausea And Vomiting    Family History  Problem Relation Age of Onset  . Diabetes Father   . Hypertension Father   . Alcohol abuse Father   . Diabetes Sister   . Breast cancer Mother   . Colon cancer Neg Hx   . Pancreatic cancer Neg Hx   . Stomach cancer Neg Hx     BP 126/90 (BP Location: Left Arm, Patient Position: Sitting, Cuff Size: Normal)   Pulse 67   Wt 221 lb 12.8 oz (100.6 kg)   SpO2 97%   BMI 30.93 kg/m    Review of Systems He denies hypoglycemia.     Objective:   Physical Exam VITAL SIGNS:  See vs page GENERAL: no distress Pulses: dorsalis pedis intact bilat.   MSK: no deformity of the feet CV: no leg edema Skin:  no ulcer on the feet.  normal color and temp on the feet. Neuro: sensation is intact to touch on the feet Ext: There is bilateral onychomycosis of the toenails.   Lab Results  Component Value Date   HGBA1C 7.8  11/21/2017       Assessment & Plan:  Insulin-requiring type 2 DM, with renal insuff: this is the best control this pt should aim for, given this regimen, which does match insulin to her changing needs throughout the day.   Patient Instructions  check your blood sugar twice a day: vary the time of day, between before the 3 meals, and at bedtime.  also check if you have symptoms of your blood sugar being too high or too low.  please keep a record of the readings and bring it to your next appointment here.  You can write it on any piece of paper.  please call us sooner if your blood sugar goes below 70, or if you have a lot of readings over 200.   Please continue the same insulin.  Please come back for a follow-up appointment in 4 months.   On this type of insulin schedule, you should eat meals on a regular schedule.  If a meal is missed or significantly delayed, your blood sugar could go low.

## 2017-11-21 NOTE — Patient Instructions (Addendum)
check your blood sugar twice a day: vary the time of day, between before the 3 meals, and at bedtime.  also check if you have symptoms of your blood sugar being too high or too low.  please keep a record of the readings and bring it to your next appointment here.  You can write it on any piece of paper.  please call us sooner if your blood sugar goes below 70, or if you have a lot of readings over 200.   Please continue the same insulin.  Please come back for a follow-up appointment in 4 months.   On this type of insulin schedule, you should eat meals on a regular schedule.  If a meal is missed or significantly delayed, your blood sugar could go low.

## 2017-11-28 IMAGING — CT CT ABD-PELV W/ CM
2 of 5 series · 16 of 46 positions shown, 18 images · IV contrast (Omni 300)
Comparison: Prior radiograph from 08/03/2011.

CLINICAL DATA: Initial evaluation for acute right lower quadrant
abdominal pain.

EXAM:
CT ABDOMEN AND PELVIS WITH CONTRAST
TECHNIQUE: Multidetector CT imaging of the abdomen and pelvis was performed
using the standard protocol following bolus administration of
intravenous contrast.
CONTRAST:  100mL A6SSO4-EVV IOPAMIDOL (A6SSO4-EVV) INJECTION 61%

[Series 3: a/p w/ 5mm · axial · 0.83mm/px · z∈[+852,+1267]mm · 13 of 95 slices shown, 15 images]
[im 6/95  soft-tissue]
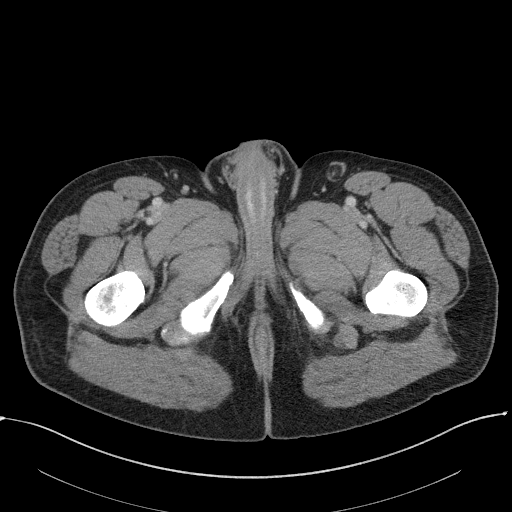
[im 6/95  bone]
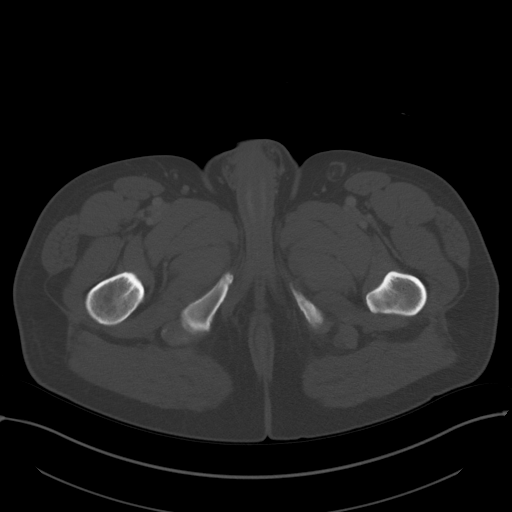
[im 12/95  soft-tissue]
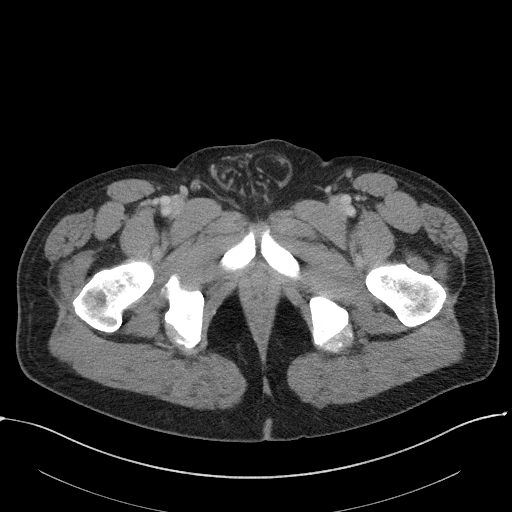
[im 18/95  soft-tissue]
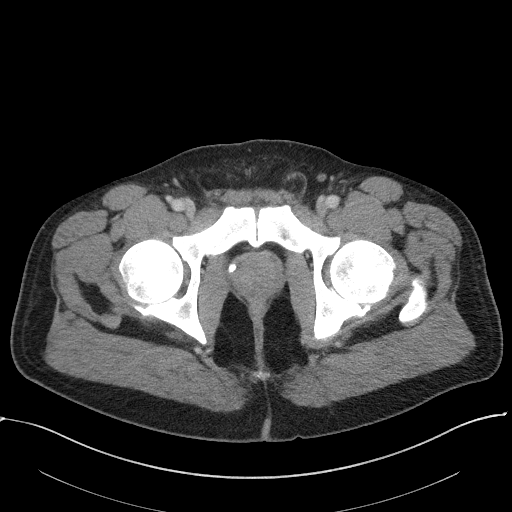
[im 30/95  soft-tissue]
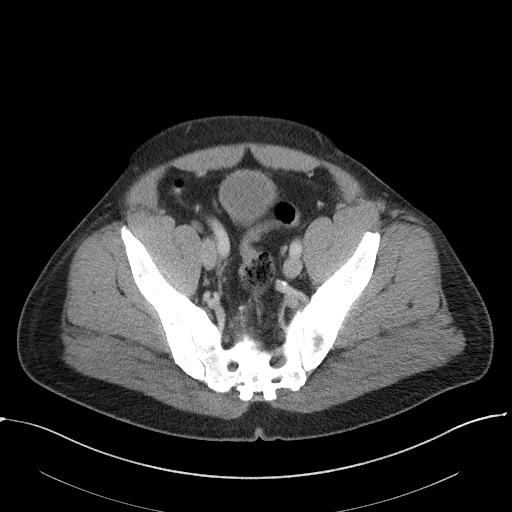
[im 36/95  soft-tissue]
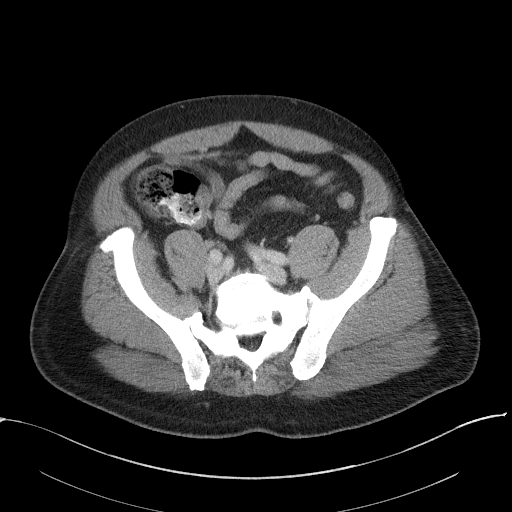
[im 42/95  soft-tissue]
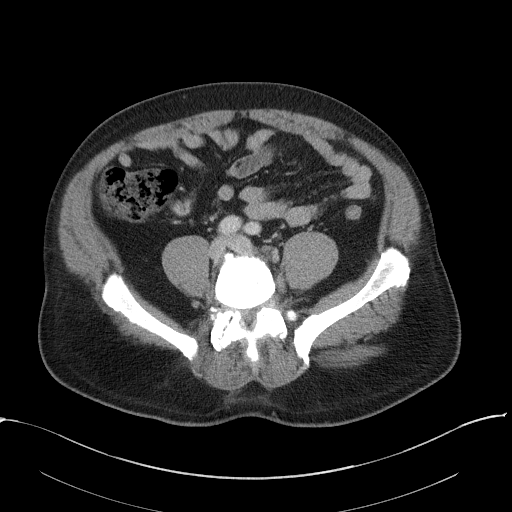
[im 48/95  soft-tissue]
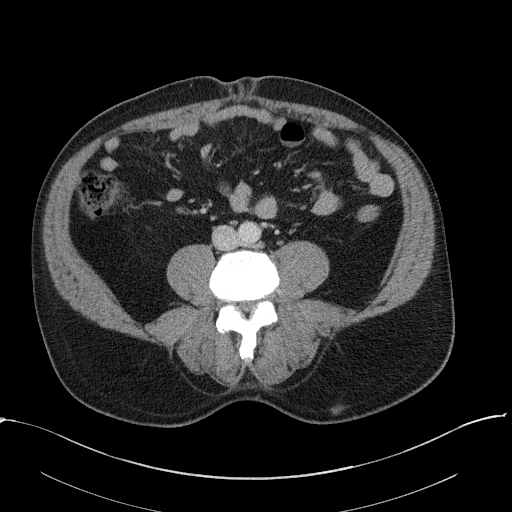
[im 53/95  soft-tissue]
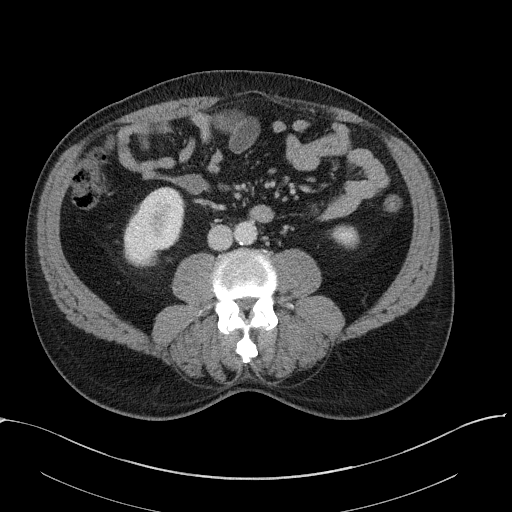
[im 59/95  soft-tissue]
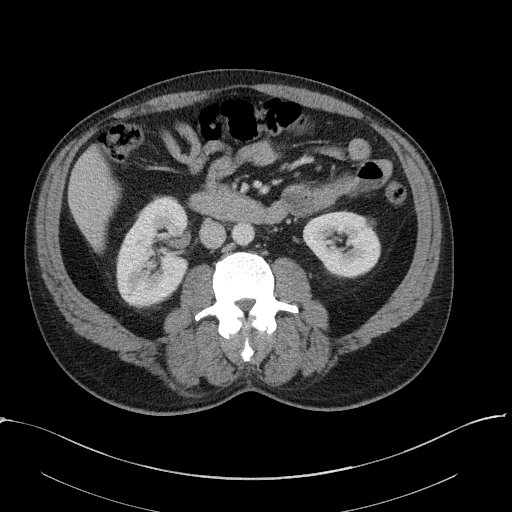
[im 59/95  bone]
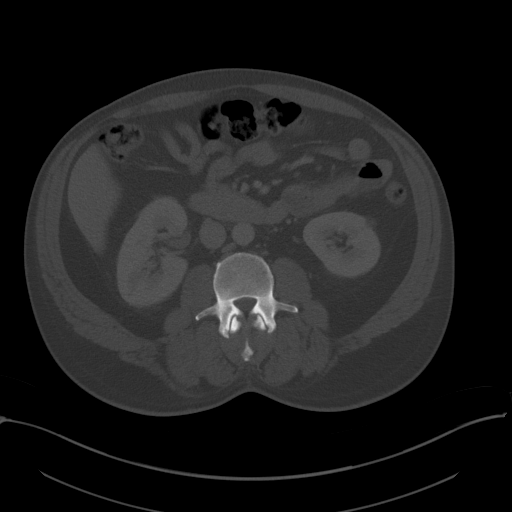
[im 65/95  soft-tissue]
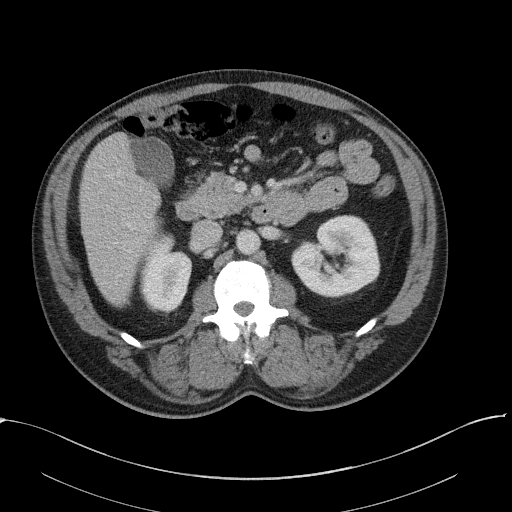
[im 77/95  soft-tissue]
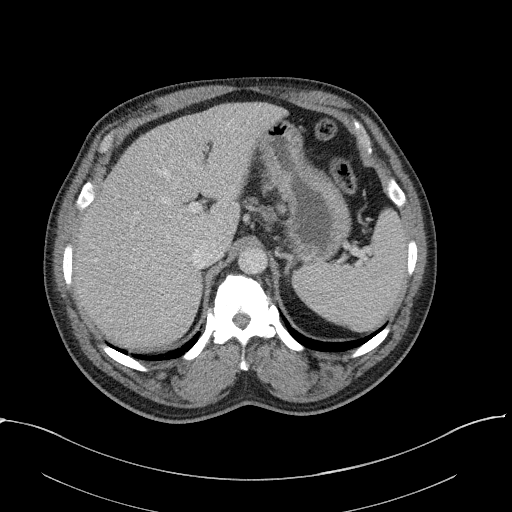
[im 83/95  soft-tissue]
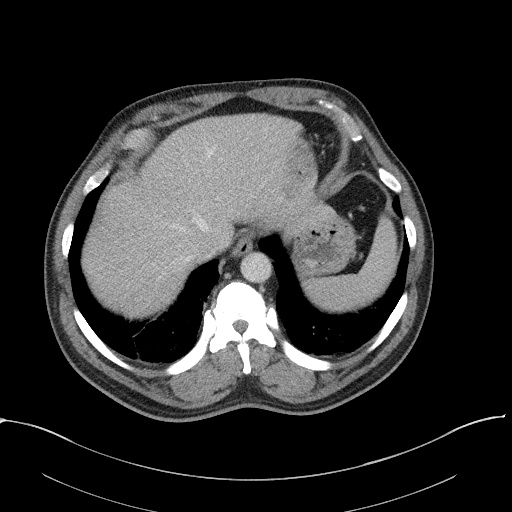
[im 89/95  soft-tissue]
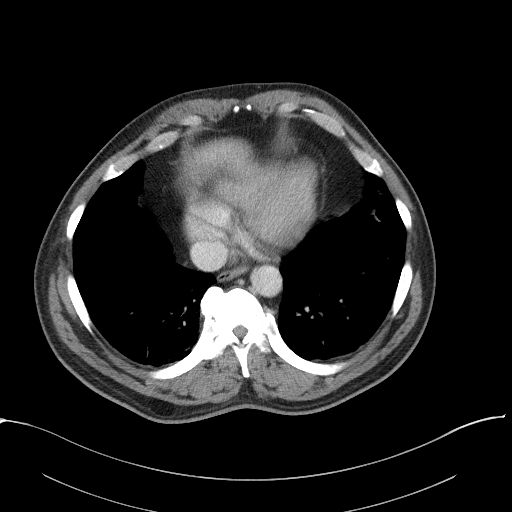

[Series 6: a/p w/ cor · coronal · 0.81mm/px · 3 of 151 slices shown]
[im 51/151  soft-tissue]
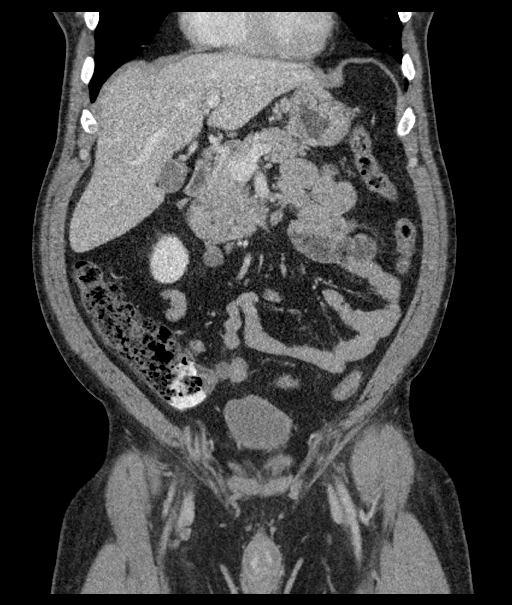
[im 67/151  soft-tissue]
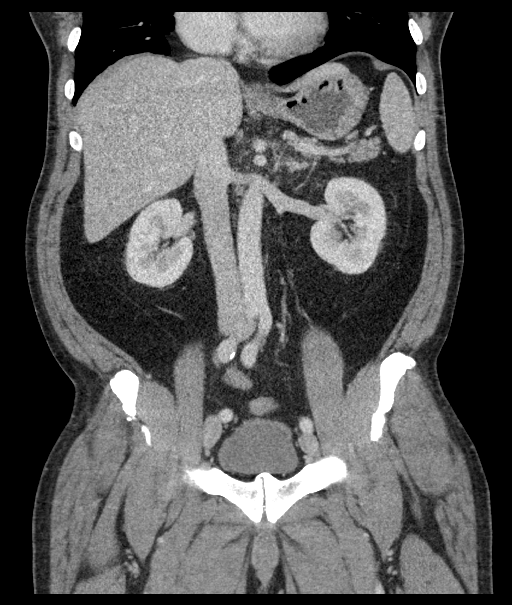
[im 84/151  soft-tissue]
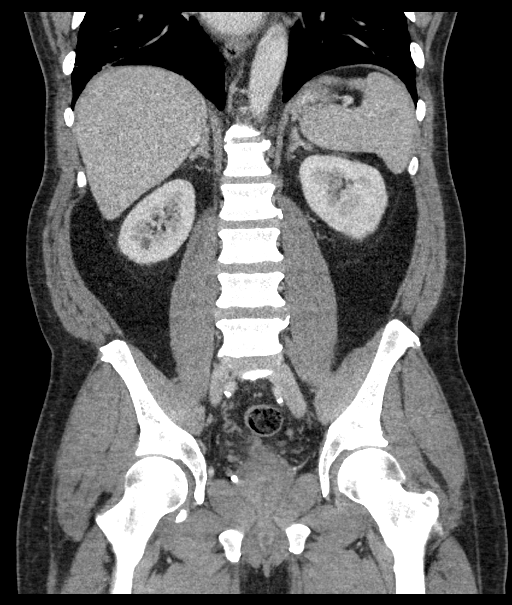

[16 of 46 positions shown; findings below may reference images not displayed]

FINDINGS: Lower chest: Scattered atelectatic changes present within the
visualized lung bases. Calcified granuloma noted at the right lung
base. Additional 5 mm right lower lobe nodule noted (series 4, image
10), indeterminate.

Hepatobiliary: Liver demonstrates a normal contrast enhanced
appearance. Gallbladder within normal limits. No biliary dilatation.

Pancreas: Pancreas within normal limits.

Spleen: Spleen within normal limits.

Adrenals/Urinary Tract: Adrenal glands are normal. Kidneys equal in
size with symmetric enhancement. Subcentimeter hypodensity within
the right kidney too small the characterize, but statistically
likely reflects a small cyst. No nephrolithiasis, hydronephrosis, or
focal enhancing renal mass. No hydroureter. Partially distended
bladder within normal limits.

Stomach/Bowel: Stomach within normal limits. No evidence for bowel
obstruction. Appendix well visualized within the right lower
quadrant, positioned anterior to the cecum (series 3, image 61).
Appendix is prominent measuring up to 10 mm in diameter. Associated
mild mucosal thickening with hazy periappendiceal fat stranding.
Findings concerning for acute appendicitis, overall mild in
appearance at this point. No evidence for perforation or other
complication.

No other acute inflammatory changes seen about the bowels. Colon is
largely decompressed.

Vascular/Lymphatic: Normal intravascular enhancement seen throughout
the intra-abdominal aorta and its branch vessels. Mild atheromatous
plaque within the aorta itself. No aneurysm. No adenopathy.

Reproductive: Prostate within normal limits.

Other: No free air or fluid. Small bilateral fat containing inguinal
hernias noted. Additional small fat containing paraumbilical hernia
present as well. No associated inflammation.

Musculoskeletal: No acute osseous abnormality. No worrisome lytic or
blastic osseous lesions. Bilateral facet arthrosis noted within the
lower lumbar spine.
IMPRESSION: 1. Findings concerning for acute appendicitis as above. No evidence
for perforation or other complication.
2. No other acute intra-abdominal or pelvic process identified.

## 2018-01-05 ENCOUNTER — Other Ambulatory Visit: Payer: Self-pay | Admitting: Endocrinology

## 2018-01-06 NOTE — Telephone Encounter (Signed)
Patient needs script for insulin-Basaglar-sent to [facility] on [address]

## 2018-03-22 ENCOUNTER — Encounter: Payer: Self-pay | Admitting: Endocrinology

## 2018-03-22 ENCOUNTER — Ambulatory Visit: Payer: BC Managed Care – PPO | Admitting: Endocrinology

## 2018-03-22 VITALS — BP 128/80 | HR 63 | Temp 98.2°F | Ht 71.0 in | Wt 216.8 lb

## 2018-03-22 DIAGNOSIS — E1022 Type 1 diabetes mellitus with diabetic chronic kidney disease: Secondary | ICD-10-CM

## 2018-03-22 LAB — POCT GLYCOSYLATED HEMOGLOBIN (HGB A1C): Hemoglobin A1C: 11.7

## 2018-03-22 MED ORDER — BASAGLAR KWIKPEN 100 UNIT/ML ~~LOC~~ SOPN: 70.0000 [IU] | PEN_INJECTOR | SUBCUTANEOUS | 5 refills | Status: DC

## 2018-03-22 NOTE — Patient Instructions (Addendum)
check your blood sugar twice a day: vary the time of day, between before the 3 meals, and at bedtime.  also check if you have symptoms of your blood sugar being too high or too low.  please keep a record of the readings and bring it to your next appointment here.  You can write it on any piece of paper.  please call us sooner if your blood sugar goes below 70, or if you have a lot of readings over 200.   Please increase the insulin to 70 units each morning.   Please come back for a follow-up appointment in 2 months.   On this type of insulin schedule, you should eat meals on a regular schedule.  If a meal is missed or significantly delayed, your blood sugar could go low.

## 2018-03-22 NOTE — Progress Notes (Signed)
Subjective:    Patient ID: Kenneth [name], male    DOB: [birth year], 62 y.o.   MRN: [id number]  HPI Pt returns for f/u of diabetes mellitus: DM type: 1 Dx'ed: 1062 Complications: renal insufficiency.  Therapy: insulin since dx DKA: twice: at the time of dx, and approx 2011.    Pancreatitis: never.   Other: pt says DKA episodes were related to alcoholism; he no longer consumes EtOH; in early 2015, he was changed to a qd insulin regimen, due to poor results with multiple daily injections; he works 2nd shift, setting up for events.   Interval history: Pt says he never misses the insulin. no cbg record, but states cbg's are well-controlled.  There is no trend throughout the day.  pt states he feels well in general.  He takes basaglar, 50 units qam.  He says he has no probs with insulin injections.  He says the insulin does not leak out fo the injection site.  A pen lasts 5 days.   Past Medical History:  Diagnosis Date  . Alcohol abuse   . Angina   . Chronic kidney disease   . Diabetes mellitus   . Hyperlipidemia   . Hypertension     Past Surgical History:  Procedure Laterality Date  . COLONOSCOPY  05/06/2014   per Dr. Henrene Pastor, clear, repeat in 10 yrs   . LAPAROSCOPIC APPENDECTOMY N/A 04/06/2017   Procedure: APPENDECTOMY LAPAROSCOPIC with primary repair of umbilical hernia;  Surgeon: Georganna Skeans, MD;  Location: [facility] OR;  Service: General;  Laterality: N/A;    Social History   Socioeconomic History  . Marital status: Single    Spouse name: Not on file  . Number of children: Not on file  . Years of education: Not on file  . Highest education level: Not on file  Occupational History  . Not on file  Social Needs  . Financial resource strain: Not on file  . Food insecurity:    Worry: Not on file    Inability: Not on file  . Transportation needs:    Medical: Not on file    Non-medical: Not on file  Tobacco Use  . Smoking status: Former Smoker    Packs/day: 0.50    Types:  Cigarettes    Last attempt to quit: 01/30/2012    Years since quitting: 6.1  . Smokeless tobacco: Never Used  Substance and Sexual Activity  . Alcohol use: Yes    Alcohol/week: 0.0 oz    Comment: rare  . Drug use: No  . Sexual activity: Not on file  Lifestyle  . Physical activity:    Days per week: Not on file    Minutes per session: Not on file  . Stress: Not on file  Relationships  . Social connections:    Talks on phone: Not on file    Gets together: Not on file    Attends religious service: Not on file    Active member of club or organization: Not on file    Attends meetings of clubs or organizations: Not on file    Relationship status: Not on file  . Intimate partner violence:    Fear of current or ex partner: Not on file    Emotionally abused: Not on file    Physically abused: Not on file    Forced sexual activity: Not on file  Other Topics Concern  . Not on file  Social History Narrative  . Not on file    Current  Outpatient Medications on File Prior to Visit  Medication Sig Dispense Refill  . acetaminophen (TYLENOL) 500 MG tablet Take 1,000 mg by mouth every 6 (six) hours as needed for mild pain.     Marland Kitchen losartan (COZAAR) 25 MG tablet Take 1 tablet (25 mg total) by mouth daily. 90 tablet 3  . naproxen sodium (ANAPROX) 220 MG tablet Take 440 mg by mouth 2 (two) times daily as needed (pain).    . simvastatin (ZOCOR) 20 MG tablet Take 1 tablet (20 mg total) by mouth at bedtime. 90 tablet 3  . UNISTRIP1 GENERIC test strip TEST BLOOD SUGARS 4 TIMES DAILY 100 each 8   No current facility-administered medications on file prior to visit.     Allergies  Allergen Reactions  . Metformin And Related Nausea And Vomiting    Family History  Problem Relation Age of Onset  . Diabetes Father   . Hypertension Father   . Alcohol abuse Father   . Diabetes Sister   . Breast cancer Mother   . Colon cancer Neg Hx   . Pancreatic cancer Neg Hx   . Stomach cancer Neg Hx     BP  128/80 (BP Location: Left Arm, Patient Position: Sitting, Cuff Size: Normal)   Pulse 63   Temp 98.2 F (36.8 C) (Oral)   Ht 5\' 11"  (1.803 m)   Wt 216 lb 12.8 oz (98.3 kg)   SpO2 97%   BMI 30.24 kg/m    Review of Systems He denies hypoglycemia    Objective:   Physical Exam VITAL SIGNS:  See vs page GENERAL: no distress Pulses: dorsalis pedis intact bilat.   MSK: no deformity of the feet CV: no leg edema Skin:  no ulcer on the feet.  normal color and temp on the feet. Neuro: sensation is intact to touch on the feet Ext: There is bilateral onychomycosis of the toenails.    A1c=11.7%    Assessment & Plan:  Type 1 DM, with renal insuff: worse.   Patient Instructions  check your blood sugar twice a day: vary the time of day, between before the 3 meals, and at bedtime.  also check if you have symptoms of your blood sugar being too high or too low.  please keep a record of the readings and bring it to your next appointment here.  You can write it on any piece of paper.  please call us sooner if your blood sugar goes below 70, or if you have a lot of readings over 200.   Please increase the insulin to 70 units each morning.   Please come back for a follow-up appointment in 2 months.   On this type of insulin schedule, you should eat meals on a regular schedule.  If a meal is missed or significantly delayed, your blood sugar could go low.

## 2018-05-24 ENCOUNTER — Encounter: Payer: Self-pay | Admitting: Endocrinology

## 2018-05-24 ENCOUNTER — Ambulatory Visit (INDEPENDENT_AMBULATORY_CARE_PROVIDER_SITE_OTHER): Payer: BC Managed Care – PPO | Admitting: Endocrinology

## 2018-05-24 VITALS — BP 122/82 | HR 65 | Wt 224.8 lb

## 2018-05-24 DIAGNOSIS — E1022 Type 1 diabetes mellitus with diabetic chronic kidney disease: Secondary | ICD-10-CM

## 2018-05-24 LAB — POCT GLYCOSYLATED HEMOGLOBIN (HGB A1C): HEMOGLOBIN A1C: 9 % — AB (ref 4.0–5.6)

## 2018-05-24 MED ORDER — BASAGLAR KWIKPEN 100 UNIT/ML ~~LOC~~ SOPN: 80.0000 [IU] | PEN_INJECTOR | SUBCUTANEOUS | 5 refills | Status: DC

## 2018-05-24 NOTE — Progress Notes (Signed)
Subjective:    Patient ID: Kenneth [name], male    DOB: [birth year], 62 y.o.   MRN: [id number]  HPI Pt returns for f/u of diabetes mellitus: DM type: 1 Dx'ed: 9024 Complications: renal insufficiency.  Therapy: insulin since dx DKA: twice: at the time of dx, and approx 2011.    Pancreatitis: never.   Other: pt says DKA episodes were related to alcoholism; he no longer consumes EtOH; in early 2015, he was changed to a qd insulin regimen, due to poor results with multiple daily injections; he works 2nd shift, setting up for events.   Interval history: Pt says he never misses the insulin. no cbg record, but states cbg's are well-controlled.  He says he never misses the insulin.   Past Medical History:  Diagnosis Date  . Alcohol abuse   . Angina   . Chronic kidney disease   . Diabetes mellitus   . Hyperlipidemia   . Hypertension     Past Surgical History:  Procedure Laterality Date  . COLONOSCOPY  05/06/2014   per Dr. Henrene Pastor, clear, repeat in 10 yrs   . LAPAROSCOPIC APPENDECTOMY N/A 04/06/2017   Procedure: APPENDECTOMY LAPAROSCOPIC with primary repair of umbilical hernia;  Surgeon: Georganna Skeans, MD;  Location: [facility] OR;  Service: General;  Laterality: N/A;    Social History   Socioeconomic History  . Marital status: Single    Spouse name: Not on file  . Number of children: Not on file  . Years of education: Not on file  . Highest education level: Not on file  Occupational History  . Not on file  Social Needs  . Financial resource strain: Not on file  . Food insecurity:    Worry: Not on file    Inability: Not on file  . Transportation needs:    Medical: Not on file    Non-medical: Not on file  Tobacco Use  . Smoking status: Former Smoker    Packs/day: 0.50    Types: Cigarettes    Last attempt to quit: 01/30/2012    Years since quitting: 6.3  . Smokeless tobacco: Never Used  Substance and Sexual Activity  . Alcohol use: Yes    Alcohol/week: 0.0 oz    Comment: rare   . Drug use: No  . Sexual activity: Not on file  Lifestyle  . Physical activity:    Days per week: Not on file    Minutes per session: Not on file  . Stress: Not on file  Relationships  . Social connections:    Talks on phone: Not on file    Gets together: Not on file    Attends religious service: Not on file    Active member of club or organization: Not on file    Attends meetings of clubs or organizations: Not on file    Relationship status: Not on file  . Intimate partner violence:    Fear of current or ex partner: Not on file    Emotionally abused: Not on file    Physically abused: Not on file    Forced sexual activity: Not on file  Other Topics Concern  . Not on file  Social History Narrative  . Not on file    Current Outpatient Medications on File Prior to Visit  Medication Sig Dispense Refill  . acetaminophen (TYLENOL) 500 MG tablet Take 1,000 mg by mouth every 6 (six) hours as needed for mild pain.     Marland Kitchen losartan (COZAAR) 25 MG tablet Take  1 tablet (25 mg total) by mouth daily. 90 tablet 3  . naproxen sodium (ANAPROX) 220 MG tablet Take 440 mg by mouth 2 (two) times daily as needed (pain).    . simvastatin (ZOCOR) 20 MG tablet Take 1 tablet (20 mg total) by mouth at bedtime. 90 tablet 3  . UNISTRIP1 GENERIC test strip TEST BLOOD SUGARS 4 TIMES DAILY 100 each 8   No current facility-administered medications on file prior to visit.     Allergies  Allergen Reactions  . Metformin And Related Nausea And Vomiting    Family History  Problem Relation Age of Onset  . Diabetes Father   . Hypertension Father   . Alcohol abuse Father   . Diabetes Sister   . Breast cancer Mother   . Colon cancer Neg Hx   . Pancreatic cancer Neg Hx   . Stomach cancer Neg Hx     BP 122/82   Pulse 65   Wt 224 lb 12.8 oz (102 kg)   SpO2 97%   BMI 31.35 kg/m    Review of Systems He denies hypoglycemia.      Objective:   Physical Exam VITAL SIGNS:  See vs page GENERAL: no  distress Pulses: foot pulses are intact bilaterally.   MSK: no deformity of the feet or ankles.  CV: no edema of the legs or ankles Skin:  no ulcer on the feet or ankles.  normal color and temp on the feet and ankles Neuro: sensation is intact to touch on the feet and ankles.    Lab Results  Component Value Date   HGBA1C 9.0 (A) 05/24/2018      Assessment & Plan:  type 1 DM, with renal insuff: he needs increased rx  Patient Instructions  check your blood sugar twice a day: vary the time of day, between before the 3 meals, and at bedtime.  also check if you have symptoms of your blood sugar being too high or too low.  please keep a record of the readings and bring it to your next appointment here.  You can write it on any piece of paper.  please call us sooner if your blood sugar goes below 70, or if you have a lot of readings over 200.   Please increase the insulin to 80 units each morning.   Please come back for a follow-up appointment in 2 months.   On this type of insulin schedule, you should eat meals on a regular schedule.  If a meal is missed or significantly delayed, your blood sugar could go low.

## 2018-05-24 NOTE — Patient Instructions (Addendum)
check your blood sugar twice a day: vary the time of day, between before the 3 meals, and at bedtime.  also check if you have symptoms of your blood sugar being too high or too low.  please keep a record of the readings and bring it to your next appointment here.  You can write it on any piece of paper.  please call us sooner if your blood sugar goes below 70, or if you have a lot of readings over 200.   Please increase the insulin to 80 units each morning.   Please come back for a follow-up appointment in 2 months.   On this type of insulin schedule, you should eat meals on a regular schedule.  If a meal is missed or significantly delayed, your blood sugar could go low.

## 2018-06-17 ENCOUNTER — Other Ambulatory Visit: Payer: Self-pay | Admitting: Endocrinology

## 2018-08-23 ENCOUNTER — Ambulatory Visit (INDEPENDENT_AMBULATORY_CARE_PROVIDER_SITE_OTHER): Payer: BC Managed Care – PPO | Admitting: Endocrinology

## 2018-08-23 VITALS — BP 126/80 | HR 77 | Wt 227.0 lb

## 2018-08-23 DIAGNOSIS — E1022 Type 1 diabetes mellitus with diabetic chronic kidney disease: Secondary | ICD-10-CM

## 2018-08-23 LAB — POCT GLYCOSYLATED HEMOGLOBIN (HGB A1C): Hemoglobin A1C: 6.4 % — AB (ref 4.0–5.6)

## 2018-08-23 MED ORDER — BASAGLAR KWIKPEN 100 UNIT/ML ~~LOC~~ SOPN: 75.0000 [IU] | PEN_INJECTOR | SUBCUTANEOUS | 5 refills | Status: DC

## 2018-08-23 NOTE — Patient Instructions (Addendum)
check your blood sugar twice a day: vary the time of day, between before the 3 meals, and at bedtime.  also check if you have symptoms of your blood sugar being too high or too low.  please keep a record of the readings and bring it to your next appointment here.  You can write it on any piece of paper.  please call us sooner if your blood sugar goes below 70, or if you have a lot of readings over 200.   Please reduce the insulin to 75 units each morning.   Please come back for a follow-up appointment in 3 months.   On this type of insulin schedule, you should eat meals on a regular schedule.  If a meal is missed or significantly delayed, your blood sugar could go low.

## 2018-08-23 NOTE — Progress Notes (Signed)
Subjective:    Patient ID: Kenneth [name], male    DOB: August 11, 1956, 62 y.o.   MRN: [id number]  HPI Pt returns for f/u of diabetes mellitus: DM type: 1 Dx'ed: 7209 Complications: renal insufficiency.  Therapy: insulin since dx DKA: twice: at the time of dx, and approx 2011.    Pancreatitis: never.   Other: pt says DKA episodes were related to alcoholism; he no longer consumes EtOH; in early 2015, he was changed to a qd insulin regimen, due to poor results with multiple daily injections; he works 2nd shift, setting up for events.   Interval history: Pt says he never misses the insulin. no cbg record, but states cbg's are well-controlled.  He says he never misses the insulin. Past Medical History:  Diagnosis Date  . Alcohol abuse   . Angina   . Chronic kidney disease   . Diabetes mellitus   . Hyperlipidemia   . Hypertension     Past Surgical History:  Procedure Laterality Date  . COLONOSCOPY  05/06/2014   per Dr. Henrene Pastor, clear, repeat in 10 yrs   . LAPAROSCOPIC APPENDECTOMY N/A 04/06/2017   Procedure: APPENDECTOMY LAPAROSCOPIC with primary repair of umbilical hernia;  Surgeon: Georganna Skeans, MD;  Location: [facility] OR;  Service: General;  Laterality: N/A;    Social History   Socioeconomic History  . Marital status: Single    Spouse name: Not on file  . Number of children: Not on file  . Years of education: Not on file  . Highest education level: Not on file  Occupational History  . Not on file  Social Needs  . Financial resource strain: Not on file  . Food insecurity:    Worry: Not on file    Inability: Not on file  . Transportation needs:    Medical: Not on file    Non-medical: Not on file  Tobacco Use  . Smoking status: Former Smoker    Packs/day: 0.50    Types: Cigarettes    Last attempt to quit: 01/30/2012    Years since quitting: 6.5  . Smokeless tobacco: Never Used  Substance and Sexual Activity  . Alcohol use: Yes    Alcohol/week: 0.0 standard drinks   Comment: rare  . Drug use: No  . Sexual activity: Not on file  Lifestyle  . Physical activity:    Days per week: Not on file    Minutes per session: Not on file  . Stress: Not on file  Relationships  . Social connections:    Talks on phone: Not on file    Gets together: Not on file    Attends religious service: Not on file    Active member of club or organization: Not on file    Attends meetings of clubs or organizations: Not on file    Relationship status: Not on file  . Intimate partner violence:    Fear of current or ex partner: Not on file    Emotionally abused: Not on file    Physically abused: Not on file    Forced sexual activity: Not on file  Other Topics Concern  . Not on file  Social History Narrative  . Not on file    Current Outpatient Medications on File Prior to Visit  Medication Sig Dispense Refill  . acetaminophen (TYLENOL) 500 MG tablet Take 1,000 mg by mouth every 6 (six) hours as needed for mild pain.     Marland Kitchen losartan (COZAAR) 25 MG tablet Take 1 tablet (25  mg total) by mouth daily. 90 tablet 3  . naproxen sodium (ANAPROX) 220 MG tablet Take 440 mg by mouth 2 (two) times daily as needed (pain).    . simvastatin (ZOCOR) 20 MG tablet Take 1 tablet (20 mg total) by mouth at bedtime. 90 tablet 3  . UNISTRIP1 GENERIC test strip TEST BLOOD SUGARS 4 TIMES DAILY 100 each 8   No current facility-administered medications on file prior to visit.     Allergies  Allergen Reactions  . Metformin And Related Nausea And Vomiting    Family History  Problem Relation Age of Onset  . Diabetes Father   . Hypertension Father   . Alcohol abuse Father   . Diabetes Sister   . Breast cancer Mother   . Colon cancer Neg Hx   . Pancreatic cancer Neg Hx   . Stomach cancer Neg Hx     BP 126/80 (BP Location: Left Arm, Patient Position: Sitting, Cuff Size: Normal)   Pulse 77   Wt 227 lb (103 kg)   SpO2 99%   BMI 31.66 kg/m    Review of Systems He denies hypoglycemia      Objective:   Physical Exam VITAL SIGNS:  See vs page GENERAL: no distress Pulses: foot pulses are intact bilaterally.   MSK: no deformity of the feet or ankles.  CV: no edema of the legs or ankles Skin:  no ulcer on the feet or ankles.  normal color and temp on the feet and ankles Neuro: sensation is intact to touch on the feet and ankles.   Ext: There is bilateral onychomycosis of the toenails.   Lab Results  Component Value Date   HGBA1C 6.4 (A) 08/23/2018       Assessment & Plan:  Insulin-requiring type 2 DM: overcontrolled, given this regimen, which does match insulin to her changing needs throughout the day.     Patient Instructions  check your blood sugar twice a day: vary the time of day, between before the 3 meals, and at bedtime.  also check if you have symptoms of your blood sugar being too high or too low.  please keep a record of the readings and bring it to your next appointment here.  You can write it on any piece of paper.  please call us sooner if your blood sugar goes below 70, or if you have a lot of readings over 200.   Please reduce the insulin to 75 units each morning.   Please come back for a follow-up appointment in 3 months.   On this type of insulin schedule, you should eat meals on a regular schedule.  If a meal is missed or significantly delayed, your blood sugar could go low.

## 2018-09-27 ENCOUNTER — Ambulatory Visit: Payer: Self-pay | Admitting: *Deleted

## 2018-09-27 NOTE — Telephone Encounter (Signed)
"  Patients says certain things he eats causes pain in his stomach and his chest area causing a burning sensation ,it better with Tums or after drinking a pepsi". Pt denies any cardiac symptoms. No fever, nausea or vomiting, diarrhea or constipation. He finds that when he starts eating the greasy, fried food he gets the burning.  This does not interfere with his normal activities,. Taking Tums helps. Appointment scheduled per protocol.  Advised to call 911 if he has nausea, sweating, weakness, headache, blurred vision, along with the burning in his chest. Pt voiced understanding.  Reason for Disposition . [1] MODERATE pain (e.g., interferes with normal activities) AND [2] comes and goes (cramps) AND [3] present > 24 hours  (Exception: pain with Vomiting or Diarrhea - see that Guideline)    Moderate pain when it starts and does not interfere with his normal activities.  Answer Assessment - Initial Assessment Questions 1. LOCATION: "Where does it hurt?"      Upper abd and chest burning 2. RADIATION: "Does the pain shoot anywhere else?" (e.g., chest, back)     Upper abd and chest burning 3. ONSET: "When did the pain begin?" (e.g., minutes, hours or days ago)      About a month ago 4. SUDDEN: "Gradual or sudden onset?"     gradual 5. PATTERN "Does the pain come and go, or is it constant?"    - If constant: "Is it getting better, staying the same, or worsening?"      (Note: Constant means the pain never goes away completely; most serious pain is constant and it progresses)     - If intermittent: "How long does it last?" "Do you have pain now?"     (Note: Intermittent means the pain goes away completely between bouts)     Pain comes and goes 6. SEVERITY: "How bad is the pain?"  (e.g., Scale 1-10; mild, moderate, or severe)    - MILD (1-3): doesn't interfere with normal activities, abdomen soft and not tender to touch     - MODERATE (4-7): interferes with normal activities or awakens from sleep,  tender to touch     - SEVERE (8-10): excruciating pain, doubled over, unable to do any normal activities       Pain # 7 when it starts 7. RECURRENT SYMPTOM: "Have you ever had this type of abdominal pain before?" If so, ask: "When was the last time?" and "What happened that time?"      no 8. AGGRAVATING FACTORS: "Does anything seem to cause this pain?" (e.g., foods, stress, alcohol)     Foods (fried, greasy and spicy) 9. CARDIAC SYMPTOMS: "Do you have any of the following symptoms: chest pain, difficulty breathing, sweating, nausea?"     no 10. OTHER SYMPTOMS: "Do you have any other symptoms?" (e.g., fever, vomiting, diarrhea)       no  Protocols used: ABDOMINAL PAIN - UPPER-A-AH

## 2018-09-28 ENCOUNTER — Encounter: Payer: Self-pay | Admitting: Family Medicine

## 2018-09-28 ENCOUNTER — Ambulatory Visit: Payer: BC Managed Care – PPO | Admitting: Family Medicine

## 2018-09-28 VITALS — BP 110/72 | HR 69 | Temp 98.5°F | Wt 225.1 lb

## 2018-09-28 DIAGNOSIS — Z23 Encounter for immunization: Secondary | ICD-10-CM

## 2018-09-28 DIAGNOSIS — K219 Gastro-esophageal reflux disease without esophagitis: Secondary | ICD-10-CM

## 2018-09-28 MED ORDER — OMEPRAZOLE 40 MG PO CPDR: 40.0000 mg | DELAYED_RELEASE_CAPSULE | Freq: Every day | ORAL | 3 refills | Status: DC

## 2018-09-28 NOTE — Progress Notes (Signed)
   Subjective:    Patient ID: Kenneth [name], male    DOB: 09-25-1956, 62 y.o.   MRN: [id number]  HPI Here for one month of intermittent burning pains in the chest and upper abdomen. These are not related to exertion. No SOB. TUMS helps a little. No nausea.    Review of Systems  Constitutional: Negative.   Respiratory: Negative.   Cardiovascular: Positive for chest pain. Negative for palpitations and leg swelling.  Gastrointestinal: Positive for abdominal pain. Negative for abdominal distention, anal bleeding, blood in stool, constipation, diarrhea, nausea, rectal pain and vomiting.       Objective:   Physical Exam  Constitutional: He appears well-developed and well-nourished.  Cardiovascular: Normal rate, regular rhythm, normal heart sounds and intact distal pulses.  Pulmonary/Chest: Effort normal and breath sounds normal. No stridor. No respiratory distress. He has no wheezes. He has no rales. He exhibits no tenderness.  Abdominal: Soft. Bowel sounds are normal. He exhibits no distension and no mass. There is no tenderness. There is no rebound and no guarding. No hernia.          Assessment & Plan:  GERD, try Omeprazole 40 mg daily for one month. Alysia Penna, MD

## 2018-10-08 ENCOUNTER — Other Ambulatory Visit: Payer: Self-pay | Admitting: Family Medicine

## 2018-10-11 ENCOUNTER — Other Ambulatory Visit: Payer: Self-pay | Admitting: Family Medicine

## 2018-10-12 NOTE — Telephone Encounter (Signed)
Pt called stating pharmacy does not have RX for losartan. I called [facility] on [address] states she has not received it and asked if it can be resent.

## 2018-10-19 ENCOUNTER — Other Ambulatory Visit: Payer: Self-pay | Admitting: Family Medicine

## 2018-10-27 ENCOUNTER — Ambulatory Visit (INDEPENDENT_AMBULATORY_CARE_PROVIDER_SITE_OTHER): Payer: BC Managed Care – PPO | Admitting: Family Medicine

## 2018-10-27 ENCOUNTER — Encounter: Payer: Self-pay | Admitting: Family Medicine

## 2018-10-27 VITALS — BP 124/64 | HR 68 | Temp 98.3°F | Ht 71.0 in | Wt 229.4 lb

## 2018-10-27 DIAGNOSIS — Z0000 Encounter for general adult medical examination without abnormal findings: Secondary | ICD-10-CM | POA: Diagnosis not present

## 2018-10-27 DIAGNOSIS — Z125 Encounter for screening for malignant neoplasm of prostate: Secondary | ICD-10-CM

## 2018-10-27 LAB — PSA: PSA: 2.15 ng/mL (ref 0.10–4.00)

## 2018-10-27 LAB — BASIC METABOLIC PANEL
BUN: 9 mg/dL (ref 6–23)
CALCIUM: 9.1 mg/dL (ref 8.4–10.5)
CO2: 32 mEq/L (ref 19–32)
CREATININE: 1.03 mg/dL (ref 0.40–1.50)
Chloride: 104 mEq/L (ref 96–112)
GFR: 94.77 mL/min (ref >60.00)
Glucose, Bld: 108 mg/dL — ABNORMAL HIGH (ref 70–99)
Potassium: 4.2 mEq/L (ref 3.5–5.1)
Sodium: 140 mEq/L (ref 135–145)

## 2018-10-27 LAB — LIPID PANEL
CHOL/HDL RATIO: 3
Cholesterol: 107 mg/dL (ref 0–200)
HDL: 37.2 mg/dL — ABNORMAL LOW (ref >39.00)
LDL Cholesterol: 59 mg/dL (ref 0–99)
NONHDL: 70.26
Triglycerides: 54 mg/dL (ref 0.0–149.0)
VLDL: 10.8 mg/dL (ref 0.0–40.0)

## 2018-10-27 LAB — CBC WITH DIFFERENTIAL/PLATELET
BASOS ABS: 0 10*3/uL (ref 0.0–0.1)
Basophils Relative: 0.4 % (ref 0.0–3.0)
EOS PCT: 2.5 % (ref 0.0–5.0)
Eosinophils Absolute: 0.2 10*3/uL (ref 0.0–0.7)
HCT: 33.6 % — ABNORMAL LOW (ref 39.0–52.0)
HEMOGLOBIN: 11.2 g/dL — AB (ref 13.0–17.0)
LYMPHS ABS: 2.1 10*3/uL (ref 0.7–4.0)
Lymphocytes Relative: 31 % (ref 12.0–46.0)
MCHC: 33.3 g/dL (ref 30.0–36.0)
MCV: 86.6 fl (ref 78.0–100.0)
Monocytes Absolute: 0.6 10*3/uL (ref 0.1–1.0)
Monocytes Relative: 8.2 % (ref 3.0–12.0)
NEUTROS PCT: 57.9 % (ref 43.0–77.0)
Neutro Abs: 3.9 10*3/uL (ref 1.4–7.7)
Platelets: 272 10*3/uL (ref 150.0–400.0)
RBC: 3.88 Mil/uL — AB (ref 4.22–5.81)
RDW: 14.3 % (ref 11.5–15.5)
WBC: 6.8 10*3/uL (ref 4.0–10.5)

## 2018-10-27 LAB — HEPATIC FUNCTION PANEL
ALBUMIN: 4 g/dL (ref 3.5–5.2)
ALT: 20 U/L (ref 0–53)
AST: 15 U/L (ref 0–37)
Alkaline Phosphatase: 102 U/L (ref 39–117)
Bilirubin, Direct: 0.1 mg/dL (ref 0.0–0.3)
Total Bilirubin: 0.6 mg/dL (ref 0.2–1.2)
Total Protein: 7.2 g/dL (ref 6.0–8.3)

## 2018-10-27 LAB — TSH: TSH: 2.32 u[IU]/mL (ref 0.35–4.50)

## 2018-10-27 MED ORDER — SIMVASTATIN 20 MG PO TABS: 20.0000 mg | ORAL_TABLET | Freq: Every day | ORAL | 3 refills | Status: DC

## 2018-10-27 NOTE — Progress Notes (Signed)
   Subjective:    Patient ID: Kenneth [name], male    DOB: 1956-09-21, 62 y.o.   MRN: [id number]  HPI Here for a well exam. He feels great. We saw him recently for GERD symptoms and these resolved quickly after he started on Omeprazole.    Review of Systems  Constitutional: Negative.   HENT: Negative.   Eyes: Negative.   Respiratory: Negative.   Cardiovascular: Negative.   Gastrointestinal: Negative.   Genitourinary: Negative.   Musculoskeletal: Negative.   Skin: Negative.   Neurological: Negative.   Psychiatric/Behavioral: Negative.        Objective:   Physical Exam  Constitutional: He is oriented to person, place, and time. He appears well-developed and well-nourished. No distress.  HENT:  Head: Normocephalic and atraumatic.  Right Ear: External ear normal.  Left Ear: External ear normal.  Nose: Nose normal.  Mouth/Throat: Oropharynx is clear and moist. No oropharyngeal exudate.  Eyes: Pupils are equal, round, and reactive to light. Conjunctivae and EOM are normal. Right eye exhibits no discharge. Left eye exhibits no discharge. No scleral icterus.  Neck: Neck supple. No JVD present. No tracheal deviation present. No thyromegaly present.  Cardiovascular: Normal rate, regular rhythm, normal heart sounds and intact distal pulses. Exam reveals no gallop and no friction rub.  No murmur heard. Pulmonary/Chest: Effort normal and breath sounds normal. No respiratory distress. He has no wheezes. He has no rales. He exhibits no tenderness.  Abdominal: Soft. Bowel sounds are normal. He exhibits no distension and no mass. There is no tenderness. There is no rebound and no guarding.  Genitourinary: Rectum normal, prostate normal and penis normal. Rectal exam shows guaiac negative stool. No penile tenderness.  Musculoskeletal: Normal range of motion. He exhibits no edema or tenderness.  Lymphadenopathy:    He has no cervical adenopathy.  Neurological: He is alert and oriented to  person, place, and time. He has normal reflexes. He displays normal reflexes. No cranial nerve deficit. He exhibits normal muscle tone. Coordination normal.  Skin: Skin is warm and dry. No rash noted. He is not diaphoretic. No erythema. No pallor.  Psychiatric: He has a normal mood and affect. His behavior is normal. Judgment and thought content normal.          Assessment & Plan:  Well exam. We discussed diet and exercise. Get fasting labs.  Alysia Penna, MD

## 2018-11-24 ENCOUNTER — Encounter: Payer: Self-pay | Admitting: Endocrinology

## 2018-11-24 ENCOUNTER — Ambulatory Visit (INDEPENDENT_AMBULATORY_CARE_PROVIDER_SITE_OTHER): Payer: BC Managed Care – PPO | Admitting: Endocrinology

## 2018-11-24 VITALS — BP 142/80 | HR 85 | Ht 71.0 in | Wt 225.6 lb

## 2018-11-24 DIAGNOSIS — E1022 Type 1 diabetes mellitus with diabetic chronic kidney disease: Secondary | ICD-10-CM

## 2018-11-24 LAB — POCT GLYCOSYLATED HEMOGLOBIN (HGB A1C): HEMOGLOBIN A1C: 10.5 % — AB (ref 4.0–5.6)

## 2018-11-24 NOTE — Patient Instructions (Addendum)
Your blood pressure is high today.  Please see your primary care provider soon, to have it rechecked.   Please see Mickel Baas, to go over the insulin injections.   check your blood sugar twice a day.  vary the time of day when you check, between before the 3 meals, and at bedtime.  also check if you have symptoms of your blood sugar being too high or too low.  please keep a record of the readings and bring it to your next appointment here (or you can bring the meter itself).  You can write it on any piece of paper.  please call us sooner if your blood sugar goes below 70, or if you have a lot of readings over 200. Please come back for a follow-up appointment in 2 months.

## 2018-11-24 NOTE — Progress Notes (Signed)
Subjective:    Patient ID: Kenneth [name], male    DOB: Apr 22, 1956, 62 y.o.   MRN: [id number]  HPI Pt returns for f/u of diabetes mellitus: DM type: 1 Dx'ed: 6440 Complications: renal insufficiency.  Therapy: insulin since dx DKA: twice: at the time of dx, and approx 2011.    Pancreatitis: never.   Other: pt says DKA episodes were related to alcoholism; he no longer consumes EtOH; in early 2015, he was changed to a qd insulin regimen, due to poor results with multiple daily injections; he works 2nd shift, setting up for events.   Interval history: Pt says he never misses the insulin, but he says injection technique might not be right. no cbg record, but states cbg's are well-controlled.   Past Medical History:  Diagnosis Date  . Alcohol abuse   . Angina   . Chronic kidney disease   . Diabetes mellitus    sees Dr. Renato [name]   . Hyperlipidemia   . Hypertension     Past Surgical History:  Procedure Laterality Date  . COLONOSCOPY  05/06/2014   per Dr. Henrene [name], clear, repeat in 10 yrs   . LAPAROSCOPIC APPENDECTOMY N/A 04/06/2017   Procedure: APPENDECTOMY LAPAROSCOPIC with primary repair of umbilical hernia;  Surgeon: Kenneth Skeans, MD;  Location: [facility] OR;  Service: General;  Laterality: N/A;    Social History   Socioeconomic History  . Marital status: Single    Spouse name: Not on file  . Number of children: Not on file  . Years of education: Not on file  . Highest education level: Not on file  Occupational History  . Not on file  Social Needs  . Financial resource strain: Not on file  . Food insecurity:    Worry: Not on file    Inability: Not on file  . Transportation needs:    Medical: Not on file    Non-medical: Not on file  Tobacco Use  . Smoking status: Former Smoker    Packs/day: 0.50    Types: Cigarettes    Last attempt to quit: 01/30/2012    Years since quitting: 6.8  . Smokeless tobacco: Never Used  Substance and Sexual Activity  . Alcohol use: Yes    Alcohol/week: 0.0 standard drinks    Comment: rare  . Drug use: No  . Sexual activity: Not on file  Lifestyle  . Physical activity:    Days per week: Not on file    Minutes per session: Not on file  . Stress: Not on file  Relationships  . Social connections:    Talks on phone: Not on file    Gets together: Not on file    Attends religious service: Not on file    Active member of club or organization: Not on file    Attends meetings of clubs or organizations: Not on file    Relationship status: Not on file  . Intimate partner violence:    Fear of current or ex partner: Not on file    Emotionally abused: Not on file    Physically abused: Not on file    Forced sexual activity: Not on file  Other Topics Concern  . Not on file  Social History Narrative  . Not on file    Current Outpatient Medications on File Prior to Visit  Medication Sig Dispense Refill  . acetaminophen (TYLENOL) 500 MG tablet Take 1,000 mg by mouth every 6 (six) hours as needed for mild pain.     Marland Kitchen  Insulin Glargine (BASAGLAR KWIKPEN) 100 UNIT/ML SOPN Inject 0.75 mLs (75 Units total) into the skin every morning. And pen needles 1/day 10 pen 5  . losartan (COZAAR) 25 MG tablet TAKE 1 TABLET BY MOUTH ONCE DAILY 90 tablet 3  . naproxen sodium (ANAPROX) 220 MG tablet Take 440 mg by mouth 2 (two) times daily as needed (pain).    Marland Kitchen omeprazole (PRILOSEC) 40 MG capsule Take 1 capsule (40 mg total) by mouth daily. 30 capsule 3  . simvastatin (ZOCOR) 20 MG tablet Take 1 tablet (20 mg total) by mouth at bedtime. 90 tablet 3  . UNISTRIP1 GENERIC test strip TEST BLOOD SUGARS 4 TIMES DAILY 100 each 8  . simvastatin (ZOCOR) 20 MG tablet TAKE 1 TABLET BY MOUTH AT BEDTIME 90 tablet 3   No current facility-administered medications on file prior to visit.     Allergies  Allergen Reactions  . Metformin And Related Nausea And Vomiting    Family History  Problem Relation Age of Onset  . Diabetes Father   . Hypertension  Father   . Alcohol abuse Father   . Diabetes Sister   . Breast cancer Mother   . Colon cancer Neg Hx   . Pancreatic cancer Neg Hx   . Stomach cancer Neg Hx     BP (!) 142/80 (BP Location: Right Arm, Patient Position: Sitting, Cuff Size: Large)   Pulse 85   Ht 5\' 11"  (1.803 m)   Wt 225 lb 9.6 oz (102.3 kg)   SpO2 94%   BMI 31.46 kg/m    Review of Systems He has gained a few lbs.     Objective:   Physical Exam VITAL SIGNS:  See vs page GENERAL: no distress Pulses: dorsalis pedis intact bilat.   MSK: no deformity of the feet CV: no leg edema.  Skin:  no ulcer on the feet.  normal color and temp on the feet. Neuro: sensation is intact to touch on the feet.   Ext: There is bilateral onychomycosis of the toenails.   Lab Results  Component Value Date   HGBA1C 10.5 (A) 11/24/2018       Assessment & Plan:  Type 1 DM, with renal insuff: much worse.  HTN: is noted today.   Patient Instructions  Your blood pressure is high today.  Please see your primary care provider soon, to have it rechecked.   Please see Mickel Baas, to go over the insulin injections.   check your blood sugar twice a day.  vary the time of day when you check, between before the 3 meals, and at bedtime.  also check if you have symptoms of your blood sugar being too high or too low.  please keep a record of the readings and bring it to your next appointment here (or you can bring the meter itself).  You can write it on any piece of paper.  please call us sooner if your blood sugar goes below 70, or if you have a lot of readings over 200. Please come back for a follow-up appointment in 2 months.

## 2018-11-26 ENCOUNTER — Other Ambulatory Visit: Payer: Self-pay | Admitting: Endocrinology

## 2018-12-21 ENCOUNTER — Encounter: Payer: Self-pay | Admitting: Dietician

## 2018-12-21 ENCOUNTER — Encounter: Payer: BC Managed Care – PPO | Attending: Endocrinology | Admitting: Dietician

## 2018-12-21 DIAGNOSIS — E1022 Type 1 diabetes mellitus with diabetic chronic kidney disease: Secondary | ICD-10-CM

## 2018-12-21 NOTE — Progress Notes (Signed)
History includes HLD, HTN, GERD.   Diabetes Self-Management Education  Visit Type: First/Initial  Appt. Start Time: 0900 Appt. End Time: 1000  12/21/2018  Mr. Kenneth [name], identified by name and date of birth, is a 63 y.o. male with a diagnosis of Diabetes: Type 1.   ASSESSMENT Medications include Basaglar 75 units q am.  He is concerned that he is not giving this correctly.  Instructed that it should be given in the muscle and showed patient technique.  Discussed site rotation and how [place] works. A1C was 6.4% 08/2018.  Patient stated that he was exercising and eating much better then.  Currently he is not exercising and is eating irregular meals, increased snacks, and increased amounts of ice cream and sweetened drinks.  Patient lives with his fiance.  They share shopping and cooking.  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  He also has a floor business.  (cleaning and installing).  When he works, he goes to the gym at 6 am.  He wishes to lose weight.  He would like to get back to 218 lbs.   Weight 225 lb (102.1 kg). Body mass index is 31.38 kg/m.  Diabetes Self-Management Education - 12/21/18 0920      Visit Information   Visit Type  First/Initial      Initial Visit   Diabetes Type  Type 1    Are you currently following a meal plan?  No    Are you taking your medications as prescribed?  Yes    Date Diagnosed  2008      Health Coping   How would you rate your overall health?  Good      Psychosocial Assessment   Patient Belief/Attitude about Diabetes  Motivated to manage diabetes    Self-care barriers  None    Self-management support  Doctor's office;Family;Friends    Other persons present  Patient;Spouse/SO    Patient Concerns  Glycemic Control;Medication;Nutrition/Meal planning    Special Needs  None    Preferred Learning Style  No preference indicated    Learning Readiness  Ready    How often do you need to have someone help  you when you read instructions, pamphlets, or other written materials from your doctor or pharmacy?  1 - Never    What is the last grade level you completed in school?  3 years college      Pre-Education Assessment   Patient understands the diabetes disease and treatment process.  Needs Review    Patient understands incorporating nutritional management into lifestyle.  Needs Review    Patient undertands incorporating physical activity into lifestyle.  Needs Review    Patient understands using medications safely.  Needs Review    Patient understands monitoring blood glucose, interpreting and using results  Needs Review    Patient understands prevention, detection, and treatment of acute complications.  Needs Review    Patient understands prevention, detection, and treatment of chronic complications.  Needs Review    Patient understands how to develop strategies to address psychosocial issues.  Needs Review    Patient understands how to develop strategies to promote health/change behavior.  Needs Review      Complications   Last HgB A1C per patient/outside source  10.5 %   11/24/18   How often do you check your blood sugar?  3-4 times / week    Fasting Blood glucose range (mg/dL)  >200;130-179   133-218   Number of hypoglycemic  episodes per month  0    Number of hyperglycemic episodes per week  14    Can you tell when your blood sugar is high?  Yes    What do you do if your blood sugar is high?  walk, water    Have you had a dilated eye exam in the past 12 months?  Yes    Have you had a dental exam in the past 12 months?  No    Are you checking your feet?  Yes    How many days per week are you checking your feet?  7      Dietary Intake   Breakfast  bacon, eggs, grits OR grits, banana OR occasional biscuit but usually less bread    Snack (morning)  banana    Lunch  usually skips OR fruit and salad with cheese    Snack (afternoon)  bananana if not earlier OR NABS    Dinner  seafood  (fried) OR 12" sub    Snack (evening)  ice cream (a lot)    Beverage(s)  water, fruit punch, 2% milk (1 daily), occasional beer      Exercise   Exercise Type  ADL's   enjoys lifting, cardio- jump rope, running     Patient Education   Previous Diabetes Education  Yes (please comment)   when diagnosed   Disease state   Definition of diabetes, type 1 and 2, and the diagnosis of diabetes    Nutrition management   Role of diet in the treatment of diabetes and the relationship between the three main macronutrients and blood glucose level;Information on hints to eating out and maintain blood glucose control.;Carbohydrate counting;Meal timing in regards to the patients' current diabetes medication.;Meal options for control of blood glucose level and chronic complications.;Food label reading, portion sizes and measuring food.    Physical activity and exercise   Role of exercise on diabetes management, blood pressure control and cardiac health.    Medications  Reviewed patients medication for diabetes, action, purpose, timing of dose and side effects.;Taught/reviewed insulin injection, site rotation, insulin storage and needle disposal.    Monitoring  Purpose and frequency of SMBG.;Identified appropriate SMBG and/or A1C goals.    Acute complications  Taught treatment of hypoglycemia - the 15 rule.    Chronic complications  Relationship between chronic complications and blood glucose control    Psychosocial adjustment  Role of stress on diabetes;Worked with patient to identify barriers to care and solutions    Personal strategies to promote health  Lifestyle issues that need to be addressed for better diabetes care      Individualized Goals (developed by patient)   Nutrition  General guidelines for healthy choices and portions discussed    Physical Activity  Exercise 5-7 days per week;45 minutes per day    Monitoring   test my blood glucose as discussed    Reducing Risk  increase portions of healthy  fats    Health Coping  discuss diabetes with (comment)   MD, RD, CDE     Post-Education Assessment   Patient understands the diabetes disease and treatment process.  Demonstrates understanding / competency    Patient understands incorporating nutritional management into lifestyle.  Demonstrates understanding / competency    Patient undertands incorporating physical activity into lifestyle.  Demonstrates understanding / competency    Patient understands using medications safely.  Demonstrates understanding / competency    Patient understands monitoring blood glucose, interpreting and using results  Demonstrates  understanding / competency    Patient understands prevention, detection, and treatment of acute complications.  Demonstrates understanding / competency    Patient understands prevention, detection, and treatment of chronic complications.  Demonstrates understanding / competency    Patient understands how to develop strategies to address psychosocial issues.  Demonstrates understanding / competency    Patient understands how to develop strategies to promote health/change behavior.  Demonstrates understanding / competency      Outcomes   Expected Outcomes  Demonstrated interest in learning. Expect positive outcomes    Future DMSE  PRN    Program Status  Completed       Individualized Plan for Diabetes Self-Management Training:   Learning Objective:  Patient will have a greater understanding of diabetes self-management. Patient education plan is to attend individual and/or group sessions per assessed needs and concerns.   Plan:   Patient Instructions  Inject insulin into fat, not muscle.  Pinch a little if needed. Rotate your insulin injection sites.  Avoid giving insulin in any site that you have used in the past month.  Avoid hard spots and bruises.  Read the labels on your beverages. Avoid skipping meals. Aim for 45-60 grams of carbs per meal. Aim for 0-30 grams of carbs per  snack if hungry. Choose a small portion of protein with each meal and snack. Bake, broil, grill rather than fry. Increase your intake of non-starchy vegetables.  Exercise most days of the week.    Expected Outcomes:  Demonstrated interest in learning. Expect positive outcomes  Education material provided: ADA Diabetes: Your Take Control Guide, Food label handouts, Meal plan card and Snack sheet  If problems or questions, patient to contact team via:  Phone  Future DSME appointment: PRN

## 2018-12-21 NOTE — Patient Instructions (Signed)
Inject insulin into fat, not muscle.  Pinch a little if needed. Rotate your insulin injection sites.  Avoid giving insulin in any site that you have used in the past month.  Avoid hard spots and bruises.  Read the labels on your beverages. Avoid skipping meals. Aim for 45-60 grams of carbs per meal. Aim for 0-30 grams of carbs per snack if hungry. Choose a small portion of protein with each meal and snack. Bake, broil, grill rather than fry. Increase your intake of non-starchy vegetables.  Exercise most days of the week.

## 2019-01-23 ENCOUNTER — Encounter: Payer: Self-pay | Admitting: Endocrinology

## 2019-01-23 ENCOUNTER — Ambulatory Visit (INDEPENDENT_AMBULATORY_CARE_PROVIDER_SITE_OTHER): Payer: BC Managed Care – PPO | Admitting: Endocrinology

## 2019-01-23 VITALS — BP 120/80 | HR 70 | Ht 71.0 in | Wt 230.2 lb

## 2019-01-23 DIAGNOSIS — E1022 Type 1 diabetes mellitus with diabetic chronic kidney disease: Secondary | ICD-10-CM | POA: Diagnosis not present

## 2019-01-23 LAB — POCT GLYCOSYLATED HEMOGLOBIN (HGB A1C): Hemoglobin A1C: 9 % — AB (ref 4.0–5.6)

## 2019-01-23 MED ORDER — BASAGLAR KWIKPEN 100 UNIT/ML ~~LOC~~ SOPN: 85.0000 [IU] | PEN_INJECTOR | SUBCUTANEOUS | 5 refills | Status: DC

## 2019-01-23 NOTE — Patient Instructions (Addendum)
Please increase the insulin to 85 units each morning.    check your blood sugar twice a day.  vary the time of day when you check, between before the 3 meals, and at bedtime.  also check if you have symptoms of your blood sugar being too high or too low.  please keep a record of the readings and bring it to your next appointment here (or you can bring the meter itself).  You can write it on any piece of paper.  please call us sooner if your blood sugar goes below 70, or if you have a lot of readings over 200. Please come back for a follow-up appointment in 2 months.

## 2019-01-23 NOTE — Progress Notes (Signed)
Subjective:    Patient ID: Kenneth [name], male    DOB: Oct 08, 1956, 63 y.o.   MRN: [id number]  HPI Pt returns for f/u of diabetes mellitus: DM type: 1 Dx'ed: 6659 Complications: renal insufficiency.  Therapy: insulin since dx DKA: twice: at the time of dx, and approx 2011.    Pancreatitis: never.   Other: pt says DKA episodes were related to alcoholism; he no longer consumes EtOH; in early 2015, he was changed to a qd insulin regimen, due to poor results with multiple daily injections; he works 2nd shift, setting up for events; he saw CDE, to verify injection technique.     Interval history: Pt says he never misses the insulin.  no cbg record, but states cbg's vary from 136-210.  There is no trend throughout the day.  Past Medical History:  Diagnosis Date  . Alcohol abuse   . Angina   . Chronic kidney disease   . Diabetes mellitus    sees Dr. Renato Shin   . Hyperlipidemia   . Hypertension     Past Surgical History:  Procedure Laterality Date  . COLONOSCOPY  05/06/2014   per Dr. Henrene Pastor, clear, repeat in 10 yrs   . LAPAROSCOPIC APPENDECTOMY N/A 04/06/2017   Procedure: APPENDECTOMY LAPAROSCOPIC with primary repair of umbilical hernia;  Surgeon: Georganna Skeans, MD;  Location: [facility] OR;  Service: General;  Laterality: N/A;    Social History   Socioeconomic History  . Marital status: Single    Spouse name: Not on file  . Number of children: Not on file  . Years of education: Not on file  . Highest education level: Not on file  Occupational History  . Not on file  Social Needs  . Financial resource strain: Not on file  . Food insecurity:    Worry: Not on file    Inability: Not on file  . Transportation needs:    Medical: Not on file    Non-medical: Not on file  Tobacco Use  . Smoking status: Former Smoker    Packs/day: 0.50    Types: Cigarettes    Last attempt to quit: 01/30/2012    Years since quitting: 6.9  . Smokeless tobacco: Never Used  Substance and Sexual  Activity  . Alcohol use: Yes    Alcohol/week: 0.0 standard drinks    Comment: rare  . Drug use: No  . Sexual activity: Not on file  Lifestyle  . Physical activity:    Days per week: Not on file    Minutes per session: Not on file  . Stress: Not on file  Relationships  . Social connections:    Talks on phone: Not on file    Gets together: Not on file    Attends religious service: Not on file    Active member of club or organization: Not on file    Attends meetings of clubs or organizations: Not on file    Relationship status: Not on file  . Intimate partner violence:    Fear of current or ex partner: Not on file    Emotionally abused: Not on file    Physically abused: Not on file    Forced sexual activity: Not on file  Other Topics Concern  . Not on file  Social History Narrative  . Not on file    Current Outpatient Medications on File Prior to Visit  Medication Sig Dispense Refill  . acetaminophen (TYLENOL) 500 MG tablet Take 1,000 mg by mouth every 6 (  six) hours as needed for mild pain.     Marland Kitchen losartan (COZAAR) 25 MG tablet TAKE 1 TABLET BY MOUTH ONCE DAILY 90 tablet 3  . naproxen sodium (ANAPROX) 220 MG tablet Take 440 mg by mouth 2 (two) times daily as needed (pain).    Marland Kitchen omeprazole (PRILOSEC) 40 MG capsule Take 1 capsule (40 mg total) by mouth daily. 30 capsule 3  . simvastatin (ZOCOR) 20 MG tablet Take 1 tablet (20 mg total) by mouth at bedtime. 90 tablet 3  . UNISTRIP1 GENERIC test strip TEST BLOOD SUGARS 4 TIMES DAILY 100 each 8   No current facility-administered medications on file prior to visit.     Allergies  Allergen Reactions  . Metformin And Related Nausea And Vomiting    Family History  Problem Relation Age of Onset  . Diabetes Father   . Hypertension Father   . Alcohol abuse Father   . Diabetes Sister   . Breast cancer Mother   . Colon cancer Neg Hx   . Pancreatic cancer Neg Hx   . Stomach cancer Neg Hx     BP 120/80 (BP Location: Left Arm,  Patient Position: Sitting, Cuff Size: Normal)   Pulse 70   Ht 5\' 11"  (1.803 m)   Wt 230 lb 3.2 oz (104.4 kg)   SpO2 96%   BMI 32.11 kg/m    Review of Systems He denies hypoglycemia    Objective:   Physical Exam VITAL SIGNS:  See vs page GENERAL: no distress Pulses: dorsalis pedis intact bilat.   MSK: no deformity of the feet CV: no leg edema Skin:  no ulcer on the feet.  normal color and temp on the feet. Neuro: sensation is intact to touch on the feet.   Ext: There is bilateral onychomycosis of the toenails.    Lab Results  Component Value Date   HGBA1C 9.0 (A) 01/23/2019        Assessment & Plan:  Type 1 DM, with renal insuff: she needs increased rx.    Patient Instructions  Please increase the insulin to 85 units each morning.    check your blood sugar twice a day.  vary the time of day when you check, between before the 3 meals, and at bedtime.  also check if you have symptoms of your blood sugar being too high or too low.  please keep a record of the readings and bring it to your next appointment here (or you can bring the meter itself).  You can write it on any piece of paper.  please call us sooner if your blood sugar goes below 70, or if you have a lot of readings over 200. Please come back for a follow-up appointment in 2 months.

## 2019-04-04 ENCOUNTER — Other Ambulatory Visit: Payer: Self-pay

## 2019-04-04 ENCOUNTER — Ambulatory Visit (INDEPENDENT_AMBULATORY_CARE_PROVIDER_SITE_OTHER): Payer: BC Managed Care – PPO | Admitting: Endocrinology

## 2019-04-04 DIAGNOSIS — N182 Chronic kidney disease, stage 2 (mild): Secondary | ICD-10-CM

## 2019-04-04 DIAGNOSIS — E1022 Type 1 diabetes mellitus with diabetic chronic kidney disease: Secondary | ICD-10-CM

## 2019-04-04 MED ORDER — BASAGLAR KWIKPEN 100 UNIT/ML ~~LOC~~ SOPN: 90.0000 [IU] | PEN_INJECTOR | SUBCUTANEOUS | 5 refills | Status: DC

## 2019-04-04 NOTE — Patient Instructions (Addendum)
Please increase the insulin to 90 units each morning.   check your blood sugar twice a day.  vary the time of day when you check, between before the 3 meals, and at bedtime.  also check if you have symptoms of your blood sugar being too high or too low.  please keep a record of the readings and bring it to your next appointment here (or you can bring the meter itself).  You can write it on any piece of paper.  please call us sooner if your blood sugar goes below 70, or if you have a lot of readings over 200. Please come back for a follow-up appointment in 6 weeks.

## 2019-04-04 NOTE — Progress Notes (Addendum)
Subjective:    Patient ID: Kenneth [name], male    DOB: [birth year], 63 y.o.   MRN: [id number]  HPI  telehealth visit today via doxy video visit.  Alternatives to telehealth are presented to this patient, and the patient agrees to the telehealth visit. Pt is advised of the cost of the visit, and agrees to this, also.   Patient is at home, and I am at the office.   Pt returns for f/u of diabetes mellitus: DM type: 1 Dx'ed: 9937 Complications: renal insufficiency.  Therapy: insulin since dx DKA: twice: at the time of dx, and approx 2011.    Pancreatitis: never.   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     Interval history: Pt says he never misses the insulin.  no cbg record, but states cbg's are in the mid-100's.  There is no trend throughout the day.  pt states he feels well in general.  Past Medical History:  Diagnosis Date  . Alcohol abuse   . Angina   . Chronic kidney disease   . Diabetes mellitus    sees Dr. Renato Shin   . Hyperlipidemia   . Hypertension     Past Surgical History:  Procedure Laterality Date  . COLONOSCOPY  05/06/2014   per Dr. Henrene Pastor, clear, repeat in 10 yrs   . LAPAROSCOPIC APPENDECTOMY N/A 04/06/2017   Procedure: APPENDECTOMY LAPAROSCOPIC with primary repair of umbilical hernia;  Surgeon: Georganna Skeans, MD;  Location: [facility] OR;  Service: General;  Laterality: N/A;    Social History   Socioeconomic History  . Marital status: Single    Spouse name: Not on file  . Number of children: Not on file  . Years of education: Not on file  . Highest education level: Not on file  Occupational History  . Not on file  Social Needs  . Financial resource strain: Not on file  . Food insecurity:    Worry: Not on file   Inability: Not on file  . Transportation needs:    Medical: Not on file    Non-medical: Not on file  Tobacco Use  . Smoking status: Former Smoker    Packs/day: 0.50    Types: Cigarettes    Last attempt to quit: 01/30/2012    Years since quitting: 7.1  . Smokeless tobacco: Never Used  Substance and Sexual Activity  . Alcohol use: Yes    Alcohol/week: 0.0 standard drinks    Comment: rare  . Drug use: No  . Sexual activity: Not on file  Lifestyle  . Physical activity:    Days per week: Not on file    Minutes per session: Not on file  . Stress: Not on file  Relationships  . Social connections:    Talks on phone: Not on file    Gets together: Not on file    Attends religious service: Not on file    Active member of club or organization: Not on file    Attends meetings of clubs or organizations: Not on file    Relationship status: Not on file  . Intimate partner violence:    Fear of current or ex partner: Not on file    Emotionally abused: Not on file    Physically  abused: Not on file    Forced sexual activity: Not on file  Other Topics Concern  . Not on file  Social History Narrative  . Not on file    Current Outpatient Medications on File Prior to Visit  Medication Sig Dispense Refill  . acetaminophen (TYLENOL) 500 MG tablet Take 1,000 mg by mouth every 6 (six) hours as needed for mild pain.     Marland Kitchen losartan (COZAAR) 25 MG tablet TAKE 1 TABLET BY MOUTH ONCE DAILY 90 tablet 3  . naproxen sodium (ANAPROX) 220 MG tablet Take 440 mg by mouth 2 (two) times daily as needed (pain).    Marland Kitchen omeprazole (PRILOSEC) 40 MG capsule Take 1 capsule (40 mg total) by mouth daily. 30 capsule 3  . simvastatin (ZOCOR) 20 MG tablet Take 1 tablet (20 mg total) by mouth at bedtime. 90 tablet 3  . UNISTRIP1 GENERIC test strip TEST BLOOD SUGARS 4 TIMES DAILY 100 each 8   No current facility-administered medications on file prior to visit.     Allergies  Allergen Reactions  . Metformin And Related  Nausea And Vomiting    Family History  Problem Relation Age of Onset  . Diabetes Father   . Hypertension Father   . Alcohol abuse Father   . Diabetes Sister   . Breast cancer Mother   . Colon cancer Neg Hx   . Pancreatic cancer Neg Hx   . Stomach cancer Neg Hx       Review of Systems He denies hypoglycemia.      Objective:   Physical Exam    Lab Results  Component Value Date   CREATININE 1.03 10/27/2018   BUN 9 10/27/2018   NA 140 10/27/2018   K 4.2 10/27/2018   CL 104 10/27/2018   CO2 32 10/27/2018      Assessment & Plan:  Type 1 DM: he needs increased rx  Patient Instructions  Please increase the insulin to 90 units each morning.   check your blood sugar twice a day.  vary the time of day when you check, between before the 3 meals, and at bedtime.  also check if you have symptoms of your blood sugar being too high or too low.  please keep a record of the readings and bring it to your next appointment here (or you can bring the meter itself).  You can write it on any piece of paper.  please call us sooner if your blood sugar goes below 70, or if you have a lot of readings over 200. Please come back for a follow-up appointment in 6 weeks.

## 2019-04-08 ENCOUNTER — Other Ambulatory Visit: Payer: Self-pay | Admitting: Family Medicine

## 2019-07-06 ENCOUNTER — Other Ambulatory Visit: Payer: Self-pay | Admitting: Family Medicine

## 2019-07-06 ENCOUNTER — Other Ambulatory Visit: Payer: Self-pay | Admitting: Endocrinology

## 2019-07-06 NOTE — Telephone Encounter (Signed)
Please refill x 1 F/u is due  

## 2019-07-09 NOTE — Telephone Encounter (Signed)
Pt called to get a refill on his losartan (COZAAR) 25 MG tablet  Pt doesn't have any left after taking last pill/ please advise    [place] (SE), [place] [address] [phone] (Phone) [phone] (Fax)

## 2019-08-09 ENCOUNTER — Other Ambulatory Visit: Payer: Self-pay

## 2019-08-09 ENCOUNTER — Telehealth: Payer: Self-pay | Admitting: Endocrinology

## 2019-08-09 DIAGNOSIS — E1022 Type 1 diabetes mellitus with diabetic chronic kidney disease: Secondary | ICD-10-CM

## 2019-08-09 MED ORDER — BASAGLAR KWIKPEN 100 UNIT/ML ~~LOC~~ SOPN: 90.0000 [IU] | PEN_INJECTOR | Freq: Every day | SUBCUTANEOUS | 0 refills | Status: DC

## 2019-08-09 NOTE — Telephone Encounter (Signed)
Insulin Glargine (BASAGLAR KWIKPEN) 100 UNIT/ML SOPN 27 mL 0 08/09/2019    Sig - Route: Inject 0.9 mLs (90 Units total) into the skin daily. - Subcutaneous   Sent to pharmacy as: Insulin Glargine (BASAGLAR KWIKPEN) 100 UNIT/ML Solution Pen-injector   Notes to Pharmacy: Will provide 30 day supply. Overdue for an appt. Future refill requests will require an appt   E-Prescribing Status: Receipt confirmed by pharmacy (08/09/2019 11:21 AM EDT)

## 2019-08-09 NOTE — Telephone Encounter (Signed)
MEDICATION: Insulin Glargine (BASAGLAR KWIKPEN) 100 UNIT/ML SOPN  PHARMACY:   [place] (SE), [place] - [address] [phone] (Phone) [phone] (Fax)    IS THIS A 90 DAY SUPPLY : No  IS PATIENT OUT OF MEDICATION: Yes  IF NOT; HOW MUCH IS LEFT: 0  LAST APPOINTMENT DATE: @7 /17/2020  NEXT APPOINTMENT DATE:@8 /21/2020  DO WE HAVE YOUR PERMISSION TO LEAVE A DETAILED MESSAGE: Yes OTHER COMMENTS:    **Let patient know to contact pharmacy at the end of the day to make sure medication is ready. **  ** Please notify patient to allow 48-72 hours to process**  **Encourage patient to contact the pharmacy for refills or they can request refills through [facility]**

## 2019-08-10 ENCOUNTER — Ambulatory Visit (INDEPENDENT_AMBULATORY_CARE_PROVIDER_SITE_OTHER): Payer: BC Managed Care – PPO | Admitting: Endocrinology

## 2019-08-10 ENCOUNTER — Encounter: Payer: Self-pay | Admitting: Endocrinology

## 2019-08-10 VITALS — BP 112/60 | HR 72 | Ht 71.0 in | Wt 236.0 lb

## 2019-08-10 DIAGNOSIS — E1022 Type 1 diabetes mellitus with diabetic chronic kidney disease: Secondary | ICD-10-CM | POA: Diagnosis not present

## 2019-08-10 DIAGNOSIS — N182 Chronic kidney disease, stage 2 (mild): Secondary | ICD-10-CM | POA: Diagnosis not present

## 2019-08-10 LAB — POCT GLYCOSYLATED HEMOGLOBIN (HGB A1C): Hemoglobin A1C: 7 % — AB (ref 4.0–5.6)

## 2019-08-10 MED ORDER — ONETOUCH VERIO VI STRP: 1.0000 | ORAL_STRIP | Freq: Two times a day (BID) | 12 refills | Status: AC

## 2019-08-10 MED ORDER — BASAGLAR KWIKPEN 100 UNIT/ML ~~LOC~~ SOPN: 85.0000 [IU] | PEN_INJECTOR | Freq: Every day | SUBCUTANEOUS | 0 refills | Status: DC

## 2019-08-10 NOTE — Progress Notes (Signed)
Subjective:    Patient ID: Kenneth [name], male    DOB: 1956/03/15, 63 y.o.   MRN: [id number]  HPI Pt returns for f/u of diabetes mellitus: DM type: 1 Dx'ed: [id number] Complications: renal insufficiency.  Therapy: insulin since dx DKA: twice: at the time of dx, and approx 2011.    Pancreatitis: never.   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   Interval history: Pt says he never misses the insulin.  He has not recently checked cbg.  pt states he feels well in general.   Past Medical History:  Diagnosis Date  . Alcohol abuse   . Angina   . Chronic kidney disease   . Diabetes mellitus    sees Dr. Renato Shin   . Hyperlipidemia   . Hypertension     Past Surgical History:  Procedure Laterality Date  . COLONOSCOPY  05/06/2014   per Dr. Henrene Pastor, clear, repeat in 10 yrs   . LAPAROSCOPIC APPENDECTOMY N/A 04/06/2017   Procedure: APPENDECTOMY LAPAROSCOPIC with primary repair of umbilical hernia;  Surgeon: Georganna Skeans, MD;  Location: [facility] OR;  Service: General;  Laterality: N/A;    Social History   Socioeconomic History  . Marital status: Single    Spouse name: Not on file  . Number of children: Not on file  . Years of education: Not on file  . Highest education level: Not on file  Occupational History  . Not on file  Social Needs  . Financial resource strain: Not on file  . Food insecurity    Worry: Not on file    Inability: Not on file  . Transportation needs    Medical: Not on file    Non-medical: Not on file  Tobacco Use  . Smoking status: Former Smoker    Packs/day: 0.50    Types: Cigarettes    Quit date: 01/30/2012    Years since quitting: 7.5  . Smokeless tobacco: Never Used  Substance and Sexual Activity  . Alcohol use: Yes     Alcohol/week: 0.0 standard drinks    Comment: rare  . Drug use: No  . Sexual activity: Not on file  Lifestyle  . Physical activity    Days per week: Not on file    Minutes per session: Not on file  . Stress: Not on file  Relationships  . Social Herbalist on phone: Not on file    Gets together: Not on file    Attends religious service: Not on file    Active member of club or organization: Not on file    Attends meetings of clubs or organizations: Not on file    Relationship status: Not on file  . Intimate partner violence    Fear of current or ex partner: Not on file    Emotionally abused: Not on file    Physically abused: Not on file    Forced sexual activity: Not on file  Other Topics Concern  . Not on file  Social History Narrative  . Not on file    Current Outpatient Medications on File Prior to Visit  Medication Sig Dispense Refill  . acetaminophen (TYLENOL) 500 MG tablet Take 1,000 mg by mouth every 6 (six) hours as needed for  mild pain.     Marland Kitchen losartan (COZAAR) 25 MG tablet Take 1 tablet by mouth once daily 90 tablet 0  . naproxen sodium (ANAPROX) 220 MG tablet Take 440 mg by mouth 2 (two) times daily as needed (pain).    Marland Kitchen omeprazole (PRILOSEC) 40 MG capsule Take 1 capsule (40 mg total) by mouth daily. 30 capsule 3  . simvastatin (ZOCOR) 20 MG tablet Take 1 tablet (20 mg total) by mouth at bedtime. 90 tablet 3  . UNISTRIP1 GENERIC test strip TEST BLOOD SUGARS 4 TIMES DAILY 100 each 8   No current facility-administered medications on file prior to visit.     Allergies  Allergen Reactions  . Metformin And Related Nausea And Vomiting    Family History  Problem Relation Age of Onset  . Diabetes Father   . Hypertension Father   . Alcohol abuse Father   . Diabetes Sister   . Breast cancer Mother   . Colon cancer Neg Hx   . Pancreatic cancer Neg Hx   . Stomach cancer Neg Hx     BP 112/60 (BP Location: Left Arm, Patient Position: Sitting, Cuff  Size: Large)   Pulse 72   Ht 5\' 11"  (1.803 m)   Wt 236 lb (107 kg)   SpO2 95%   BMI 32.92 kg/m    Review of Systems He denies tremor and palpitations.  He has weight gain.      Objective:   Physical Exam VITAL SIGNS:  See vs page GENERAL: no distress Pulses: dorsalis pedis intact bilat.   MSK: no deformity of the feet CV: trace bilat leg edema Skin:  no ulcer on the feet.  normal color and temp on the feet. Neuro: sensation is intact to touch on the feet  Lab Results  Component Value Date   HGBA1C 7.0 (A) 08/10/2019    Lab Results  Component Value Date   CREATININE 1.03 10/27/2018   BUN 9 10/27/2018   NA 140 10/27/2018   K 4.2 10/27/2018   CL 104 10/27/2018   CO2 32 10/27/2018      Assessment & Plan:  Type 1 DM: ovecontrolled, given this regimen, which does match insulin to his changing needs throughout the day Noncompliance with cbg recording.  This limits DM rx  Patient Instructions  Please reduce the insulin to 85 units each morning.   check your blood sugar twice a day.  vary the time of day when you check, between before the 3 meals, and at bedtime.  also check if you have symptoms of your blood sugar being too high or too low.  please keep a record of the readings and bring it to your next appointment here (or you can bring the meter itself).  You can write it on any piece of paper.  please call us sooner if your blood sugar goes below 70, or if you have a lot of readings over 200. Here is a new meter.  I have sent a prescription to your pharmacy, for strips.  Please come back for a follow-up appointment in 2-3 months.

## 2019-08-10 NOTE — Patient Instructions (Addendum)
Please reduce the insulin to 85 units each morning.   check your blood sugar twice a day.  vary the time of day when you check, between before the 3 meals, and at bedtime.  also check if you have symptoms of your blood sugar being too high or too low.  please keep a record of the readings and bring it to your next appointment here (or you can bring the meter itself).  You can write it on any piece of paper.  please call us sooner if your blood sugar goes below 70, or if you have a lot of readings over 200. Here is a new meter.  I have sent a prescription to your pharmacy, for strips.  Please come back for a follow-up appointment in 2-3 months.

## 2019-09-08 ENCOUNTER — Other Ambulatory Visit: Payer: Self-pay | Admitting: Endocrinology

## 2019-09-08 DIAGNOSIS — E1022 Type 1 diabetes mellitus with diabetic chronic kidney disease: Secondary | ICD-10-CM

## 2019-10-05 ENCOUNTER — Telehealth: Payer: Self-pay | Admitting: Family Medicine

## 2019-10-05 ENCOUNTER — Other Ambulatory Visit: Payer: Self-pay | Admitting: Endocrinology

## 2019-10-05 ENCOUNTER — Other Ambulatory Visit: Payer: Self-pay | Admitting: Family Medicine

## 2019-10-05 DIAGNOSIS — E1022 Type 1 diabetes mellitus with diabetic chronic kidney disease: Secondary | ICD-10-CM

## 2019-10-05 MED ORDER — LOSARTAN POTASSIUM 25 MG PO TABS: 25.0000 mg | ORAL_TABLET | Freq: Every day | ORAL | 0 refills | Status: DC

## 2019-10-05 NOTE — Telephone Encounter (Signed)
Copied from [place] [id number]. Topic: Quick Communication - Rx Refill/Question >> Oct 05, 2019  3:05 PM Izola Price, [place] A wrote: Medication: losartan (COZAAR) 25 MG tablet ,Insulin Glargine (BASAGLAR KWIKPEN) 100 UNIT/ML SOPN  Has the patient contacted their pharmacy? {Yes (Agent: If no, request that the patient contact the pharmacy for the refill.) (Agent: If yes, when and what did the pharmacy advise?)Contact PCP  Preferred Pharmacy (with phone number or street name): [facility] ([address]  - [place] [id number] (Phone) [phone] (Fax)    Agent: Please be advised that RX refills may take up to 3 business days. We ask that you follow-up with your pharmacy.

## 2019-10-05 NOTE — Telephone Encounter (Signed)
Copied from [place] [id number]. Topic: Quick Communication - Rx Refill/Question >> Oct 05, 2019  3:09 PM Rainey Pines A wrote: Medication:losartan (COZAAR) 25 MG tablet,Insulin Glargine (BASAGLAR KWIKPEN) 100 UNIT/ML SOPN   Has the patient contacted their pharmacy? Yes (Agent: If no, request that the patient contact the pharmacy for the refill.) (Agent: If yes, when and what did the pharmacy advise?)Contact PCP [facility] ([address] [place] [id number] (Phone) [phone] (Fax)   Preferred Pharmacy (with phone number or street name):   Agent: Please be advised that RX refills may take up to 3 business days. We ask that you follow-up with your pharmacy.

## 2019-11-04 ENCOUNTER — Other Ambulatory Visit: Payer: Self-pay | Admitting: Endocrinology

## 2019-11-04 ENCOUNTER — Other Ambulatory Visit: Payer: Self-pay | Admitting: Family Medicine

## 2019-11-04 DIAGNOSIS — N182 Chronic kidney disease, stage 2 (mild): Secondary | ICD-10-CM

## 2019-11-04 DIAGNOSIS — E1022 Type 1 diabetes mellitus with diabetic chronic kidney disease: Secondary | ICD-10-CM

## 2019-11-14 ENCOUNTER — Other Ambulatory Visit: Payer: Self-pay

## 2019-11-19 ENCOUNTER — Encounter: Payer: Self-pay | Admitting: Endocrinology

## 2019-11-19 ENCOUNTER — Other Ambulatory Visit: Payer: Self-pay

## 2019-11-19 ENCOUNTER — Ambulatory Visit (INDEPENDENT_AMBULATORY_CARE_PROVIDER_SITE_OTHER): Payer: BC Managed Care – PPO | Admitting: Endocrinology

## 2019-11-19 VITALS — BP 140/88 | HR 70 | Ht 71.0 in | Wt 235.4 lb

## 2019-11-19 DIAGNOSIS — E1022 Type 1 diabetes mellitus with diabetic chronic kidney disease: Secondary | ICD-10-CM

## 2019-11-19 DIAGNOSIS — N182 Chronic kidney disease, stage 2 (mild): Secondary | ICD-10-CM | POA: Diagnosis not present

## 2019-11-19 LAB — POCT GLYCOSYLATED HEMOGLOBIN (HGB A1C): Hemoglobin A1C: 5.5 % (ref 4.0–5.6)

## 2019-11-19 NOTE — Progress Notes (Signed)
Subjective:    Patient ID: Kenneth [name], male    DOB: Sep 21, 1956, 63 y.o.   MRN: [id number]  HPI Pt returns for f/u of diabetes mellitus:  DM type: 1 Dx'ed: [id number] Complications: renal insufficiency.  Therapy: insulin since dx DKA: twice: at the time of dx, and approx 2011.    Pancreatitis: never.   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   Interval history: Pt says he never misses the insulin.  no cbg record, but states cbg's are in the 200's.  pt states he feels well in general.   Past Medical History:  Diagnosis Date  . Alcohol abuse   . Angina   . Chronic kidney disease   . Diabetes mellitus    sees Dr. Renato Shin   . Hyperlipidemia   . Hypertension     Past Surgical History:  Procedure Laterality Date  . COLONOSCOPY  05/06/2014   per Dr. Henrene Pastor, clear, repeat in 10 yrs   . LAPAROSCOPIC APPENDECTOMY N/A 04/06/2017   Procedure: APPENDECTOMY LAPAROSCOPIC with primary repair of umbilical hernia;  Surgeon: Georganna Skeans, MD;  Location: [facility] OR;  Service: General;  Laterality: N/A;    Social History   Socioeconomic History  . Marital status: Single    Spouse name: Not on file  . Number of children: Not on file  . Years of education: Not on file  . Highest education level: Not on file  Occupational History  . Not on file  Social Needs  . Financial resource strain: Not on file  . Food insecurity    Worry: Not on file    Inability: Not on file  . Transportation needs    Medical: Not on file    Non-medical: Not on file  Tobacco Use  . Smoking status: Former Smoker    Packs/day: 0.50    Types: Cigarettes    Quit date: 01/30/2012    Years since quitting: 7.8  . Smokeless tobacco: Never Used  Substance and Sexual Activity  .  Alcohol use: Yes    Alcohol/week: 0.0 standard drinks    Comment: rare  . Drug use: No  . Sexual activity: Not on file  Lifestyle  . Physical activity    Days per week: Not on file    Minutes per session: Not on file  . Stress: Not on file  Relationships  . Social Herbalist on phone: Not on file    Gets together: Not on file    Attends religious service: Not on file    Active member of club or organization: Not on file    Attends meetings of clubs or organizations: Not on file    Relationship status: Not on file  . Intimate partner violence    Fear of current or ex partner: Not on file    Emotionally abused: Not on file    Physically abused: Not on file    Forced sexual activity: Not on file  Other Topics Concern  . Not on file  Social History Narrative  . Not on file    Current Outpatient Medications on File Prior to Visit  Medication Sig Dispense Refill  . acetaminophen (TYLENOL) 500 MG tablet Take 1,000 mg by mouth every 6 (  six) hours as needed for mild pain.     Marland Kitchen glucose blood (ONETOUCH VERIO) test strip 1 each by Other route 2 (two) times daily. And lancets 2/day. 100 each 12  . Insulin Glargine (BASAGLAR KWIKPEN) 100 UNIT/ML SOPN INJECT 90 UNITS SUBCUTANEOUSLY ONCE DAILY (  APPOINTMENT  NEEDED  FOR  FURTURE  REFILLS) 30 mL 0  . losartan (COZAAR) 25 MG tablet TAKE 1 TABLET BY MOUTH ONCE DAILY (PATIENT  NEEDS  OFFICE  VISIT) 30 tablet 0  . naproxen sodium (ANAPROX) 220 MG tablet Take 440 mg by mouth 2 (two) times daily as needed (pain).    . simvastatin (ZOCOR) 20 MG tablet Take 1 tablet (20 mg total) by mouth at bedtime. 90 tablet 3  . UNISTRIP1 GENERIC test strip TEST BLOOD SUGARS 4 TIMES DAILY 100 each 8   No current facility-administered medications on file prior to visit.     Allergies  Allergen Reactions  . Metformin And Related Nausea And Vomiting    Family History  Problem Relation Age of Onset  . Diabetes Father   . Hypertension Father   .  Alcohol abuse Father   . Diabetes Sister   . Breast cancer Mother   . Colon cancer Neg Hx   . Pancreatic cancer Neg Hx   . Stomach cancer Neg Hx     BP 140/88 (BP Location: Left Arm, Patient Position: Sitting, Cuff Size: Normal)   Pulse 70   Ht 5\' 11"  (1.803 m)   Wt 235 lb 6.4 oz (106.8 kg)   SpO2 100%   BMI 32.83 kg/m   Review of Systems He denies hypoglycemia.      Objective:   Physical Exam VITAL SIGNS:  See vs page GENERAL: no distress Pulses: dorsalis pedis intact bilat.   MSK: no deformity of the feet CV: no leg edema Skin:  no ulcer on the feet, but the skin is dry and scaly.  normal color and temp on the feet. Neuro: sensation is intact to touch on the feet.   Ext: there is bilateral onychomycosis of the toenails.    Lab Results  Component Value Date   HGBA1C 5.5 11/19/2019   Lab Results  Component Value Date   CREATININE 1.03 10/27/2018   BUN 9 10/27/2018   NA 140 10/27/2018   K 4.2 10/27/2018   CL 104 10/27/2018   CO2 32 10/27/2018        Assessment & Plan:  Type 1 DM: prob overcontrolled. discrepancy between a1c and reported cbg's, new.  Check fructosamine.   Patient Instructions  A different type of diabetes blood test is requested for you today.  We'll let you know about the results.  If this confirms the A1c, we'll reduce the insulin.  check your blood sugar twice a day.  vary the time of day when you check, between before the 3 meals, and at bedtime.  also check if you have symptoms of your blood sugar being too high or too low.  please keep a record of the readings and bring it to your next appointment here (or you can bring the meter itself).  You can write it on any piece of paper.  please call us sooner if your blood sugar goes below 70, or if you have a lot of readings over 200. Here is a new meter.  I have sent a prescription to your pharmacy, for strips.   Please come back for a follow-up appointment in 3 months.

## 2019-11-19 NOTE — Patient Instructions (Addendum)
A different type of diabetes blood test is requested for you today.  We'll let you know about the results.  If this confirms the A1c, we'll reduce the insulin.  check your blood sugar twice a day.  vary the time of day when you check, between before the 3 meals, and at bedtime.  also check if you have symptoms of your blood sugar being too high or too low.  please keep a record of the readings and bring it to your next appointment here (or you can bring the meter itself).  You can write it on any piece of paper.  please call us sooner if your blood sugar goes below 70, or if you have a lot of readings over 200. Here is a new meter.  I have sent a prescription to your pharmacy, for strips.   Please come back for a follow-up appointment in 3 months.

## 2019-11-23 ENCOUNTER — Telehealth: Payer: Self-pay

## 2019-11-23 ENCOUNTER — Other Ambulatory Visit: Payer: Self-pay

## 2019-11-23 DIAGNOSIS — E1022 Type 1 diabetes mellitus with diabetic chronic kidney disease: Secondary | ICD-10-CM

## 2019-11-23 LAB — FRUCTOSAMINE: Fructosamine: 230 umol/L (ref 205–285)

## 2019-11-23 MED ORDER — BASAGLAR KWIKPEN 100 UNIT/ML ~~LOC~~ SOPN: 80.0000 [IU] | PEN_INJECTOR | SUBCUTANEOUS | 0 refills | Status: DC

## 2019-11-23 NOTE — Telephone Encounter (Signed)
-----   Message from Renato Shin, MD sent at 11/23/2019  2:08 PM EST ----- please contact patient: this test confirms the A1c, so please reduce the insulin to 80 units each morning. Please come back for a follow-up appointment in 2 months.

## 2019-11-23 NOTE — Telephone Encounter (Signed)
Lab results reviewed by Dr. Ellison. Called pt to inform about lab results as well as new orders. Using closed-loop communication, pt verbalized complete acceptance and understanding of all information provided. No further questions nor concerns were voiced at this time. 

## 2019-12-01 ENCOUNTER — Other Ambulatory Visit: Payer: Self-pay | Admitting: Family Medicine

## 2019-12-03 ENCOUNTER — Telehealth: Payer: Self-pay | Admitting: Family Medicine

## 2019-12-03 ENCOUNTER — Other Ambulatory Visit: Payer: Self-pay | Admitting: Endocrinology

## 2019-12-03 DIAGNOSIS — E1022 Type 1 diabetes mellitus with diabetic chronic kidney disease: Secondary | ICD-10-CM

## 2019-12-03 NOTE — Telephone Encounter (Signed)
Please refill both for one year

## 2019-12-03 NOTE — Telephone Encounter (Signed)
Please advise 

## 2019-12-03 NOTE — Telephone Encounter (Signed)
Patient called in and would like to have his medication refilled. Does have appointment scheduled for 23rd. Please advise.

## 2019-12-03 NOTE — Telephone Encounter (Signed)
Ok to fill before the appointment?

## 2019-12-04 ENCOUNTER — Other Ambulatory Visit: Payer: Self-pay

## 2019-12-04 MED ORDER — LOSARTAN POTASSIUM 25 MG PO TABS: ORAL_TABLET | ORAL | 3 refills | Status: DC

## 2019-12-04 MED ORDER — SIMVASTATIN 20 MG PO TABS: 20.0000 mg | ORAL_TABLET | Freq: Every day | ORAL | 3 refills | Status: DC

## 2019-12-04 NOTE — Telephone Encounter (Signed)
Rx sent in. Pt is aware.

## 2019-12-11 ENCOUNTER — Other Ambulatory Visit: Payer: Self-pay

## 2019-12-12 ENCOUNTER — Ambulatory Visit (INDEPENDENT_AMBULATORY_CARE_PROVIDER_SITE_OTHER): Payer: BC Managed Care – PPO | Admitting: Family Medicine

## 2019-12-12 ENCOUNTER — Encounter: Payer: Self-pay | Admitting: Family Medicine

## 2019-12-12 ENCOUNTER — Telehealth: Payer: Self-pay | Admitting: *Deleted

## 2019-12-12 VITALS — BP 130/60 | HR 81 | Temp 97.3°F | Wt 232.4 lb

## 2019-12-12 DIAGNOSIS — Z0000 Encounter for general adult medical examination without abnormal findings: Secondary | ICD-10-CM

## 2019-12-12 LAB — LIPID PANEL
Cholesterol: 140 mg/dL (ref 0–200)
HDL: 47.7 mg/dL (ref >39.00)
LDL Cholesterol: 79 mg/dL (ref 0–99)
NonHDL: 92.74
Total CHOL/HDL Ratio: 3
Triglycerides: 70 mg/dL (ref 0.0–149.0)
VLDL: 14 mg/dL (ref 0.0–40.0)

## 2019-12-12 LAB — CBC WITH DIFFERENTIAL/PLATELET
Basophils Absolute: 0 10*3/uL (ref 0.0–0.1)
Basophils Relative: 0.5 % (ref 0.0–3.0)
Eosinophils Absolute: 0.1 10*3/uL (ref 0.0–0.7)
Eosinophils Relative: 1.4 % (ref 0.0–5.0)
HCT: 34 % — ABNORMAL LOW (ref 39.0–52.0)
Hemoglobin: 10.9 g/dL — ABNORMAL LOW (ref 13.0–17.0)
Lymphocytes Relative: 40.6 % (ref 12.0–46.0)
Lymphs Abs: 3.4 10*3/uL (ref 0.7–4.0)
MCHC: 32.2 g/dL (ref 30.0–36.0)
MCV: 86.3 fl (ref 78.0–100.0)
Monocytes Absolute: 0.7 10*3/uL (ref 0.1–1.0)
Monocytes Relative: 8.9 % (ref 3.0–12.0)
Neutro Abs: 4 10*3/uL (ref 1.4–7.7)
Neutrophils Relative %: 48.6 % (ref 43.0–77.0)
Platelets: 336 10*3/uL (ref 150.0–400.0)
RBC: 3.94 Mil/uL — ABNORMAL LOW (ref 4.22–5.81)
RDW: 15.3 % (ref 11.5–15.5)
WBC: 8.3 10*3/uL (ref 4.0–10.5)

## 2019-12-12 LAB — HEPATIC FUNCTION PANEL
ALT: 17 U/L (ref 0–53)
AST: 15 U/L (ref 0–37)
Albumin: 4.3 g/dL (ref 3.5–5.2)
Alkaline Phosphatase: 93 U/L (ref 39–117)
Bilirubin, Direct: 0.1 mg/dL (ref 0.0–0.3)
Total Bilirubin: 0.5 mg/dL (ref 0.2–1.2)
Total Protein: 7.6 g/dL (ref 6.0–8.3)

## 2019-12-12 LAB — BASIC METABOLIC PANEL
BUN: 14 mg/dL (ref 6–23)
CO2: 26 mEq/L (ref 19–32)
Calcium: 9.3 mg/dL (ref 8.4–10.5)
Chloride: 106 mEq/L (ref 96–112)
Creatinine, Ser: 1.05 mg/dL (ref 0.40–1.50)
GFR: 86.88 mL/min (ref >60.00)
Glucose, Bld: 38 mg/dL — CL (ref 70–99)
Potassium: 3.5 mEq/L (ref 3.5–5.1)
Sodium: 139 mEq/L (ref 135–145)

## 2019-12-12 LAB — TSH: TSH: 2.29 u[IU]/mL (ref 0.35–4.50)

## 2019-12-12 LAB — PSA: PSA: 4.11 ng/mL — ABNORMAL HIGH (ref 0.10–4.00)

## 2019-12-12 NOTE — Telephone Encounter (Signed)
Spoke with patient. Informed him of Dr. Barbie Banner message. Patient verbalized understanding. Patient reports he feels fine and was eating some peppermint candy. Patient denied shakiness and patient was not confused.

## 2019-12-12 NOTE — Telephone Encounter (Signed)
Received a call from Gary at [facility]. Patient lab result is glucose 38.

## 2019-12-12 NOTE — Progress Notes (Signed)
   Subjective:    Patient ID: Kenneth [name], male    DOB: 1956-09-23, 63 y.o.   MRN: [id number]  HPI Here for a well exam. He feels great. He sees Dr. Loanne Drilling for the diabetes, and this has been well controlled. His last A1c on 11-19-19 was 5.5.    Review of Systems  Constitutional: Negative.   HENT: Negative.   Eyes: Negative.   Respiratory: Negative.   Cardiovascular: Negative.   Gastrointestinal: Negative.   Genitourinary: Negative.   Musculoskeletal: Negative.   Skin: Negative.   Neurological: Negative.   Psychiatric/Behavioral: Negative.        Objective:   Physical Exam Constitutional:      General: He is not in acute distress.    Appearance: He is well-developed. He is not diaphoretic.  HENT:     Head: Normocephalic and atraumatic.     Right Ear: External ear normal.     Left Ear: External ear normal.     Nose: Nose normal.     Mouth/Throat:     Pharynx: No oropharyngeal exudate.  Eyes:     General: No scleral icterus.       Right eye: No discharge.        Left eye: No discharge.     Conjunctiva/sclera: Conjunctivae normal.     Pupils: Pupils are equal, round, and reactive to light.  Neck:     Thyroid: No thyromegaly.     Vascular: No JVD.     Trachea: No tracheal deviation.  Cardiovascular:     Rate and Rhythm: Normal rate and regular rhythm.     Heart sounds: Normal heart sounds. No murmur. No friction rub. No gallop.   Pulmonary:     Effort: Pulmonary effort is normal. No respiratory distress.     Breath sounds: Normal breath sounds. No wheezing or rales.  Chest:     Chest wall: No tenderness.  Abdominal:     General: Bowel sounds are normal. There is no distension.     Palpations: Abdomen is soft. There is no mass.     Tenderness: There is no abdominal tenderness. There is no guarding or rebound.  Genitourinary:    Penis: Normal. No tenderness.      Testes: Normal.     Prostate: Normal.     Rectum: Normal. Guaiac result negative.    Musculoskeletal:        General: No tenderness. Normal range of motion.     Cervical back: Neck supple.  Lymphadenopathy:     Cervical: No cervical adenopathy.  Skin:    General: Skin is warm and dry.     Coloration: Skin is not pale.     Findings: No erythema or rash.  Neurological:     Mental Status: He is alert and oriented to person, place, and time.     Cranial Nerves: No cranial nerve deficit.     Motor: No abnormal muscle tone.     Coordination: Coordination normal.     Deep Tendon Reflexes: Reflexes are normal and symmetric. Reflexes normal.  Psychiatric:        Behavior: Behavior normal.        Thought Content: Thought content normal.        Judgment: Judgment normal.           Assessment & Plan:  Well exam. We discussed diet and exercise. Get fasting labs.  Alysia Penna, MD

## 2019-12-12 NOTE — Telephone Encounter (Signed)
Tell him to eat something sweet or drink orange juice to get this back up (he had been fasting for CPE labs)

## 2019-12-18 ENCOUNTER — Other Ambulatory Visit: Payer: Self-pay

## 2019-12-18 MED ORDER — FERROUS GLUCONATE 324 (38 FE) MG PO TABS: 324.0000 mg | ORAL_TABLET | Freq: Every day | ORAL | 5 refills | Status: DC

## 2020-01-05 ENCOUNTER — Other Ambulatory Visit: Payer: Self-pay | Admitting: Endocrinology

## 2020-01-05 DIAGNOSIS — E1022 Type 1 diabetes mellitus with diabetic chronic kidney disease: Secondary | ICD-10-CM

## 2020-01-07 ENCOUNTER — Other Ambulatory Visit: Payer: Self-pay

## 2020-01-07 DIAGNOSIS — N182 Chronic kidney disease, stage 2 (mild): Secondary | ICD-10-CM

## 2020-01-07 DIAGNOSIS — E1022 Type 1 diabetes mellitus with diabetic chronic kidney disease: Secondary | ICD-10-CM

## 2020-01-07 MED ORDER — BASAGLAR KWIKPEN 100 UNIT/ML ~~LOC~~ SOPN: PEN_INJECTOR | SUBCUTANEOUS | 0 refills | Status: DC

## 2020-02-07 ENCOUNTER — Ambulatory Visit: Payer: BC Managed Care – PPO

## 2020-02-11 ENCOUNTER — Ambulatory Visit: Payer: BC Managed Care – PPO | Attending: Family

## 2020-02-11 DIAGNOSIS — Z23 Encounter for immunization: Secondary | ICD-10-CM | POA: Insufficient documentation

## 2020-02-11 NOTE — Progress Notes (Signed)
   Covid-19 Vaccination Clinic  Name:  Kenneth [name]    MRN: [id number] DOB: Apr 18, 1956  02/11/2020  Mr. Gammell was observed post Covid-19 immunization for 15 minutes without incidence. He was provided with Vaccine Information Sheet and instruction to access the V-Safe system.   Mr. Hornbaker was instructed to call 911 with any severe reactions post vaccine: Marland Kitchen Difficulty breathing  . Swelling of your face and throat  . A fast heartbeat  . A bad rash all over your body  . Dizziness and weakness    Immunizations Administered    Name Date Dose VIS Date Route   Moderna COVID-19 Vaccine 02/11/2020  1:35 PM 0.5 mL 11/20/2019 Intramuscular   Manufacturer: Moderna   Lot: [id number]   [id number]

## 2020-02-14 ENCOUNTER — Other Ambulatory Visit: Payer: Self-pay

## 2020-02-18 ENCOUNTER — Other Ambulatory Visit: Payer: Self-pay

## 2020-02-18 ENCOUNTER — Ambulatory Visit (INDEPENDENT_AMBULATORY_CARE_PROVIDER_SITE_OTHER): Payer: BC Managed Care – PPO | Admitting: Endocrinology

## 2020-02-18 ENCOUNTER — Encounter: Payer: Self-pay | Admitting: Endocrinology

## 2020-02-18 VITALS — BP 136/78 | HR 80 | Ht 71.0 in | Wt 238.0 lb

## 2020-02-18 DIAGNOSIS — E1022 Type 1 diabetes mellitus with diabetic chronic kidney disease: Secondary | ICD-10-CM | POA: Diagnosis not present

## 2020-02-18 DIAGNOSIS — N182 Chronic kidney disease, stage 2 (mild): Secondary | ICD-10-CM | POA: Diagnosis not present

## 2020-02-18 LAB — POCT GLYCOSYLATED HEMOGLOBIN (HGB A1C): Hemoglobin A1C: 5.6 % (ref 4.0–5.6)

## 2020-02-18 MED ORDER — BASAGLAR KWIKPEN 100 UNIT/ML ~~LOC~~ SOPN: 70.0000 [IU] | PEN_INJECTOR | SUBCUTANEOUS | 3 refills | Status: DC

## 2020-02-18 NOTE — Progress Notes (Signed)
Subjective:    Patient ID: Kenneth [name], male    DOB: December 05, 1956, 64 y.o.   MRN: [id number]  HPI Pt returns for f/u of diabetes mellitus:  DM type: 1 Dx'ed: [id number] Complications: renal insufficiency.  Therapy: insulin since dx DKA: twice: at the time of dx, and approx 2011.    Pancreatitis: never.   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 Interval history: Pt says he never misses the insulin.  no cbg record, but states cbg's are well-controlled.  pt states he feels well in general.   Past Medical History:  Diagnosis Date  . Alcohol abuse   . Angina   . Chronic kidney disease   . Diabetes mellitus    sees Dr. Renato Shin   . Hyperlipidemia   . Hypertension     Past Surgical History:  Procedure Laterality Date  . COLONOSCOPY  05/06/2014   per Dr. Henrene Pastor, clear, repeat in 10 yrs   . LAPAROSCOPIC APPENDECTOMY N/A 04/06/2017   Procedure: APPENDECTOMY LAPAROSCOPIC with primary repair of umbilical hernia;  Surgeon: Georganna Skeans, MD;  Location: [facility] OR;  Service: General;  Laterality: N/A;    Social History   Socioeconomic History  . Marital status: Single    Spouse name: Not on file  . Number of children: Not on file  . Years of education: Not on file  . Highest education level: Not on file  Occupational History  . Not on file  Tobacco Use  . Smoking status: Former Smoker    Packs/day: 0.50    Types: Cigarettes    Quit date: 01/30/2012    Years since quitting: 8.0  . Smokeless tobacco: Never Used  Substance and Sexual Activity  . Alcohol use: Yes    Alcohol/week: 0.0 standard drinks    Comment: rare  . Drug use: No  . Sexual activity: Not on file  Other Topics Concern  . Not on file  Social  History Narrative  . Not on file   Social Determinants of Health   Financial Resource Strain:   . Difficulty of Paying Living Expenses: Not on file  Food Insecurity:   . Worried About Charity fundraiser in the Last Year: Not on file  . Ran Out of Food in the Last Year: Not on file  Transportation Needs:   . Lack of Transportation (Medical): Not on file  . Lack of Transportation (Non-Medical): Not on file  Physical Activity:   . Days of Exercise per Week: Not on file  . Minutes of Exercise per Session: Not on file  Stress:   . Feeling of Stress : Not on file  Social Connections:   . Frequency of Communication with Friends and Family: Not on file  . Frequency of Social Gatherings with Friends and Family: Not on file  . Attends Religious Services: Not on file  . Active Member of Clubs or Organizations: Not on file  . Attends Archivist Meetings: Not on file  . Marital Status: Not on file  Intimate Partner Violence:   . Fear of Current or Ex-Partner: Not on file  . Emotionally Abused: Not on file  . Physically Abused: Not on file  . Sexually Abused: Not on file  Current Outpatient Medications on File Prior to Visit  Medication Sig Dispense Refill  . acetaminophen (TYLENOL) 500 MG tablet Take 1,000 mg by mouth every 6 (six) hours as needed for mild pain.     . ferrous gluconate (FERGON) 324 MG tablet Take 1 tablet (324 mg total) by mouth daily with breakfast. 60 tablet 5  . glucose blood (ONETOUCH VERIO) test strip 1 each by Other route 2 (two) times daily. And lancets 2/day. 100 each 12  . losartan (COZAAR) 25 MG tablet TAKE 1 TABLET BY MOUTH ONCE DAILY 90 tablet 3  . naproxen sodium (ANAPROX) 220 MG tablet Take 440 mg by mouth 2 (two) times daily as needed (pain).    . simvastatin (ZOCOR) 20 MG tablet Take 1 tablet (20 mg total) by mouth at bedtime. 90 tablet 3   No current facility-administered medications on file prior to visit.    Allergies  Allergen  Reactions  . Metformin And Related Nausea And Vomiting    Family History  Problem Relation Age of Onset  . Diabetes Father   . Hypertension Father   . Alcohol abuse Father   . Diabetes Sister   . Breast cancer Mother   . Colon cancer Neg Hx   . Pancreatic cancer Neg Hx   . Stomach cancer Neg Hx     BP 136/78 (BP Location: Left Arm, Patient Position: Sitting, Cuff Size: Large)   Pulse 80   Ht 5\' 11"  (1.803 m)   Wt 238 lb (108 kg)   SpO2 96%   BMI 33.19 kg/m    Review of Systems He has gained a few lbs.  He denies hypoglycemia   Objective:   Physical Exam VITAL SIGNS:  See vs page GENERAL: no distress Pulses: dorsalis pedis intact bilat.   MSK: no deformity of the feet CV: 1+ bilat leg edema Skin:  no ulcer on the feet, but the skin is dry.  normal color and temp on the feet.  Neuro: sensation is intact to touch on the feet.  Ext: there is bilateral onychomycosis of the toenails.   Lab Results  Component Value Date   TSH 2.29 12/12/2019   Lab Results  Component Value Date   CREATININE 1.05 12/12/2019   BUN 14 12/12/2019   NA 139 12/12/2019   K 3.5 12/12/2019   CL 106 12/12/2019   CO2 26 12/12/2019    Lab Results  Component Value Date   HGBA1C 5.6 02/18/2020       Assessment & Plan:  Type 1 DM: overcontrolled   Patient Instructions  Please reduce the insulin to 70 units each morning. check your blood sugar twice a day.  vary the time of day when you check, between before the 3 meals, and at bedtime.  also check if you have symptoms of your blood sugar being too high or too low.  please keep a record of the readings and bring it to your next appointment here (or you can bring the meter itself).  You can write it on any piece of paper.  please call us sooner if your blood sugar goes below 70, or if you have a lot of readings over 200. Here is a new meter.  I have sent a prescription to your pharmacy, for strips.   Please come back for a follow-up  appointment in 3 months.

## 2020-02-18 NOTE — Patient Instructions (Addendum)
Please reduce the insulin to 70 units each morning. check your blood sugar twice a day.  vary the time of day when you check, between before the 3 meals, and at bedtime.  also check if you have symptoms of your blood sugar being too high or too low.  please keep a record of the readings and bring it to your next appointment here (or you can bring the meter itself).  You can write it on any piece of paper.  please call us sooner if your blood sugar goes below 70, or if you have a lot of readings over 200. Here is a new meter.  I have sent a prescription to your pharmacy, for strips.   Please come back for a follow-up appointment in 3 months.

## 2020-03-18 ENCOUNTER — Ambulatory Visit: Payer: BC Managed Care – PPO | Attending: Family

## 2020-03-18 DIAGNOSIS — Z23 Encounter for immunization: Secondary | ICD-10-CM

## 2020-03-18 NOTE — Progress Notes (Signed)
   Covid-19 Vaccination Clinic  Name:  Kenneth [name]    MRN: [id number] DOB: June 13, 1956  03/18/2020  Mr. Rettke was observed post Covid-19 immunization for 15 minutes without incident. He was provided with Vaccine Information Sheet and instruction to access the V-Safe system.   Mr. Huizar was instructed to call 911 with any severe reactions post vaccine: Marland Kitchen Difficulty breathing  . Swelling of face and throat  . A fast heartbeat  . A bad rash all over body  . Dizziness and weakness   Immunizations Administered    Name Date Dose VIS Date Route   Moderna COVID-19 Vaccine 03/18/2020 12:04 PM 0.5 mL 11/20/2019 Intramuscular   Manufacturer: Moderna   Lot: [id number]   [id number]

## 2020-05-15 ENCOUNTER — Other Ambulatory Visit: Payer: Self-pay

## 2020-05-20 ENCOUNTER — Other Ambulatory Visit: Payer: Self-pay

## 2020-05-20 ENCOUNTER — Encounter: Payer: Self-pay | Admitting: Endocrinology

## 2020-05-20 ENCOUNTER — Ambulatory Visit: Payer: BC Managed Care – PPO | Admitting: Endocrinology

## 2020-05-20 VITALS — BP 114/78 | HR 70 | Ht 71.0 in | Wt 229.0 lb

## 2020-05-20 DIAGNOSIS — N182 Chronic kidney disease, stage 2 (mild): Secondary | ICD-10-CM

## 2020-05-20 DIAGNOSIS — E1022 Type 1 diabetes mellitus with diabetic chronic kidney disease: Secondary | ICD-10-CM

## 2020-05-20 LAB — POCT GLYCOSYLATED HEMOGLOBIN (HGB A1C): Hemoglobin A1C: 5.1 % (ref 4.0–5.6)

## 2020-05-20 MED ORDER — BASAGLAR KWIKPEN 100 UNIT/ML ~~LOC~~ SOPN: 60.0000 [IU] | PEN_INJECTOR | SUBCUTANEOUS | 3 refills | Status: DC

## 2020-05-20 NOTE — Progress Notes (Signed)
Subjective:    Patient ID: Kenneth [name], male    DOB: [birth year], 64 y.o.   MRN: [id number]  HPI Pt returns for f/u of diabetes mellitus:  DM type: 1 Dx'ed: [id number] Complications: renal insufficiency.  Therapy: insulin since dx DKA: twice: at the time of dx, and approx 2011.    Pancreatitis: never.   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 Interval history: Pt says he never misses the insulin.  no cbg record, but states cbg's are in the 100's.  pt states he feels well in general.  Past Medical History:  Diagnosis Date  . Alcohol abuse   . Angina   . Chronic kidney disease   . Diabetes mellitus    sees Dr. Renato Shin   . Hyperlipidemia   . Hypertension     Past Surgical History:  Procedure Laterality Date  . COLONOSCOPY  05/06/2014   per Dr. Henrene Pastor, clear, repeat in 10 yrs   . LAPAROSCOPIC APPENDECTOMY N/A 04/06/2017   Procedure: APPENDECTOMY LAPAROSCOPIC with primary repair of umbilical hernia;  Surgeon: Georganna Skeans, MD;  Location: [facility] OR;  Service: General;  Laterality: N/A;    Social History   Socioeconomic History  . Marital status: Single    Spouse name: Not on file  . Number of children: Not on file  . Years of education: Not on file  . Highest education level: Not on file  Occupational History  . Not on file  Tobacco Use  . Smoking status: Former Smoker    Packs/day: 0.50    Types: Cigarettes    Quit date: 01/30/2012    Years since quitting: 8.3  . Smokeless tobacco: Never Used  Substance and Sexual Activity  . Alcohol use: Yes    Alcohol/week: 0.0 standard drinks    Comment: rare  . Drug use: No  . Sexual activity: Not on file  Other Topics Concern  . Not on file  Social History  Narrative  . Not on file   Social Determinants of Health   Financial Resource Strain:   . Difficulty of Paying Living Expenses:   Food Insecurity:   . Worried About Charity fundraiser in the Last Year:   . Arboriculturist in the Last Year:   Transportation Needs:   . Film/video editor (Medical):   Marland Kitchen Lack of Transportation (Non-Medical):   Physical Activity:   . Days of Exercise per Week:   . Minutes of Exercise per Session:   Stress:   . Feeling of Stress :   Social Connections:   . Frequency of Communication with Friends and Family:   . Frequency of Social Gatherings with Friends and Family:   . Attends Religious Services:   . Active Member of Clubs or Organizations:   . Attends Archivist Meetings:   Marland Kitchen Marital Status:   Intimate Partner Violence:   . Fear of Current or Ex-Partner:   . Emotionally Abused:   Marland Kitchen Physically Abused:   . Sexually Abused:     Current Outpatient Medications on File Prior to Visit  Medication Sig Dispense Refill  . acetaminophen (TYLENOL) 500 MG tablet Take 1,000 mg by mouth every 6 (six) hours as needed for  mild pain.     . ferrous gluconate (FERGON) 324 MG tablet Take 1 tablet (324 mg total) by mouth daily with breakfast. 60 tablet 5  . glucose blood (ONETOUCH VERIO) test strip 1 each by Other route 2 (two) times daily. And lancets 2/day. 100 each 12  . losartan (COZAAR) 25 MG tablet TAKE 1 TABLET BY MOUTH ONCE DAILY 90 tablet 3  . naproxen sodium (ANAPROX) 220 MG tablet Take 440 mg by mouth 2 (two) times daily as needed (pain).    . simvastatin (ZOCOR) 20 MG tablet Take 1 tablet (20 mg total) by mouth at bedtime. 90 tablet 3   No current facility-administered medications on file prior to visit.    Allergies  Allergen Reactions  . Metformin And Related Nausea And Vomiting    Family History  Problem Relation Age of Onset  . Diabetes Father   . Hypertension Father   . Alcohol abuse Father   . Diabetes Sister   . Breast  cancer Mother   . Colon cancer Neg Hx   . Pancreatic cancer Neg Hx   . Stomach cancer Neg Hx     BP 114/78   Pulse 70   Ht 5\' 11"  (1.803 m)   Wt 229 lb (103.9 kg)   SpO2 98%   BMI 31.94 kg/m    Review of Systems He denies hypoglycemia    Objective:   Physical Exam VITAL SIGNS:  See vs page GENERAL: no distress Pulses: dorsalis pedis intact bilat.   MSK: no deformity of the feet CV: 1+ bilat leg edema Skin:  no ulcer on the feet.  normal color and temp on the feet. Neuro: sensation is intact to touch on the feet Ext: there is bilateral onychomycosis of the toenails   Lab Results  Component Value Date   HGBA1C 5.1 05/20/2020       Assessment & Plan:  Type 1 DM: overcontrolled  Patient Instructions  Please reduce the insulin to 60 units each morning.   check your blood sugar twice a day.  vary the time of day when you check, between before the 3 meals, and at bedtime.  also check if you have symptoms of your blood sugar being too high or too low.  please keep a record of the readings and bring it to your next appointment here (or you can bring the meter itself).  You can write it on any piece of paper.  please call us sooner if your blood sugar goes below 70, or if you have a lot of readings over 200. Please come back for a follow-up appointment in 3 months.

## 2020-05-20 NOTE — Patient Instructions (Addendum)
Please reduce the insulin to 60 units each morning.   check your blood sugar twice a day.  vary the time of day when you check, between before the 3 meals, and at bedtime.  also check if you have symptoms of your blood sugar being too high or too low.  please keep a record of the readings and bring it to your next appointment here (or you can bring the meter itself).  You can write it on any piece of paper.  please call us sooner if your blood sugar goes below 70, or if you have a lot of readings over 200. Please come back for a follow-up appointment in 3 months.

## 2020-08-20 ENCOUNTER — Other Ambulatory Visit: Payer: Self-pay

## 2020-08-20 ENCOUNTER — Encounter: Payer: Self-pay | Admitting: Endocrinology

## 2020-08-20 ENCOUNTER — Ambulatory Visit (INDEPENDENT_AMBULATORY_CARE_PROVIDER_SITE_OTHER): Payer: BC Managed Care – PPO | Admitting: Endocrinology

## 2020-08-20 VITALS — BP 104/70 | HR 79 | Ht 71.0 in | Wt 222.4 lb

## 2020-08-20 DIAGNOSIS — N182 Chronic kidney disease, stage 2 (mild): Secondary | ICD-10-CM

## 2020-08-20 DIAGNOSIS — E1022 Type 1 diabetes mellitus with diabetic chronic kidney disease: Secondary | ICD-10-CM | POA: Diagnosis not present

## 2020-08-20 LAB — POCT GLYCOSYLATED HEMOGLOBIN (HGB A1C): Hemoglobin A1C: 4.9 % (ref 4.0–5.6)

## 2020-08-20 MED ORDER — BASAGLAR KWIKPEN 100 UNIT/ML ~~LOC~~ SOPN: 50.0000 [IU] | PEN_INJECTOR | SUBCUTANEOUS | Status: DC

## 2020-08-20 NOTE — Patient Instructions (Addendum)
Please reduce the insulin to 50 units each morning.   check your blood sugar twice a day.  vary the time of day when you check, between before the 3 meals, and at bedtime.  also check if you have symptoms of your blood sugar being too high or too low.  please keep a record of the readings and bring it to your next appointment here (or you can bring the meter itself).  You can write it on any piece of paper.  please call us sooner if your blood sugar goes below 70, or if you have a lot of readings over 200. Please come back for a follow-up appointment in 2 months.

## 2020-08-20 NOTE — Progress Notes (Signed)
Subjective:    Patient ID: Kenneth [name], male    DOB: [birth year], 64 y.o.   MRN: [id number]  HPI Pt returns for f/u of diabetes mellitus:  DM type: 1 Dx'ed: 5035 Complications: renal insufficiency.  Therapy: insulin since dx DKA: twice: at the time of dx, and approx 2011.    Pancreatitis: never.   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 Interval history: Pt says he never misses the insulin.  He does not check cbg's.  pt states he feels well in general.  Past Medical History:  Diagnosis Date  . Alcohol abuse   . Angina   . Chronic kidney disease   . Diabetes mellitus    sees Dr. Renato Shin   . Hyperlipidemia   . Hypertension     Past Surgical History:  Procedure Laterality Date  . COLONOSCOPY  05/06/2014   per Dr. Henrene Pastor, clear, repeat in 10 yrs   . LAPAROSCOPIC APPENDECTOMY N/A 04/06/2017   Procedure: APPENDECTOMY LAPAROSCOPIC with primary repair of umbilical hernia;  Surgeon: Georganna Skeans, MD;  Location: [facility] OR;  Service: General;  Laterality: N/A;    Social History   Socioeconomic History  . Marital status: Single    Spouse name: Not on file  . Number of children: Not on file  . Years of education: Not on file  . Highest education level: Not on file  Occupational History  . Not on file  Tobacco Use  . Smoking status: Former Smoker    Packs/day: 0.50    Types: Cigarettes    Quit date: 01/30/2012    Years since quitting: 8.5  . Smokeless tobacco: Never Used  Substance and Sexual Activity  . Alcohol use: Yes    Alcohol/week: 0.0 standard drinks    Comment: rare  . Drug use: No  . Sexual activity: Not on file  Other Topics Concern  . Not on file  Social History Narrative  . Not on  file   Social Determinants of Health   Financial Resource Strain:   . Difficulty of Paying Living Expenses: Not on file  Food Insecurity:   . Worried About Charity fundraiser in the Last Year: Not on file  . Ran Out of Food in the Last Year: Not on file  Transportation Needs:   . Lack of Transportation (Medical): Not on file  . Lack of Transportation (Non-Medical): Not on file  Physical Activity:   . Days of Exercise per Week: Not on file  . Minutes of Exercise per Session: Not on file  Stress:   . Feeling of Stress : Not on file  Social Connections:   . Frequency of Communication with Friends and Family: Not on file  . Frequency of Social Gatherings with Friends and Family: Not on file  . Attends Religious Services: Not on file  . Active Member of Clubs or Organizations: Not on file  . Attends Archivist Meetings: Not on file  . Marital Status: Not on file  Intimate Partner Violence:   . Fear of Current or Ex-Partner: Not on file  . Emotionally Abused: Not on file  . Physically Abused: Not on file  . Sexually Abused: Not on file    Current  Outpatient Medications on File Prior to Visit  Medication Sig Dispense Refill  . acetaminophen (TYLENOL) 500 MG tablet Take 1,000 mg by mouth every 6 (six) hours as needed for mild pain.     . ferrous gluconate (FERGON) 324 MG tablet Take 1 tablet (324 mg total) by mouth daily with breakfast. 60 tablet 5  . glucose blood (ONETOUCH VERIO) test strip 1 each by Other route 2 (two) times daily. And lancets 2/day. 100 each 12  . losartan (COZAAR) 25 MG tablet TAKE 1 TABLET BY MOUTH ONCE DAILY 90 tablet 3  . naproxen sodium (ANAPROX) 220 MG tablet Take 440 mg by mouth 2 (two) times daily as needed (pain).    . simvastatin (ZOCOR) 20 MG tablet Take 1 tablet (20 mg total) by mouth at bedtime. 90 tablet 3   No current facility-administered medications on file prior to visit.    Allergies  Allergen Reactions  . Metformin And Related  Nausea And Vomiting    Family History  Problem Relation Age of Onset  . Diabetes Father   . Hypertension Father   . Alcohol abuse Father   . Diabetes Sister   . Breast cancer Mother   . Colon cancer Neg Hx   . Pancreatic cancer Neg Hx   . Stomach cancer Neg Hx     BP 104/70   Pulse 79   Ht 5\' 11"  (1.803 m)   Wt 222 lb 6.4 oz (100.9 kg)   SpO2 95%   BMI 31.02 kg/m    Review of Systems He has lost a few lbs    Objective:   Physical Exam VITAL SIGNS:  See vs page GENERAL: no distress Pulses: dorsalis pedis intact bilat.   MSK: no deformity of the feet CV: 1+ bilat leg edema Skin:  no ulcer on the feet.  normal color and temp on the feet. Neuro: sensation is intact to touch on the feet Ext: there is bilateral onychomycosis of the toenails.    Lab Results  Component Value Date   HGBA1C 4.9 08/20/2020       Assessment & Plan:  Type 1 DM, with CRI: overcontrolled.   Patient Instructions  Please reduce the insulin to 50 units each morning.   check your blood sugar twice a day.  vary the time of day when you check, between before the 3 meals, and at bedtime.  also check if you have symptoms of your blood sugar being too high or too low.  please keep a record of the readings and bring it to your next appointment here (or you can bring the meter itself).  You can write it on any piece of paper.  please call us sooner if your blood sugar goes below 70, or if you have a lot of readings over 200. Please come back for a follow-up appointment in 2 months.

## 2020-10-20 ENCOUNTER — Encounter: Payer: Self-pay | Admitting: Endocrinology

## 2020-10-20 ENCOUNTER — Ambulatory Visit (INDEPENDENT_AMBULATORY_CARE_PROVIDER_SITE_OTHER): Payer: BC Managed Care – PPO | Admitting: Endocrinology

## 2020-10-20 ENCOUNTER — Other Ambulatory Visit: Payer: Self-pay

## 2020-10-20 VITALS — BP 148/70 | Ht 71.0 in | Wt 222.0 lb

## 2020-10-20 DIAGNOSIS — E1022 Type 1 diabetes mellitus with diabetic chronic kidney disease: Secondary | ICD-10-CM

## 2020-10-20 DIAGNOSIS — N189 Chronic kidney disease, unspecified: Secondary | ICD-10-CM | POA: Diagnosis not present

## 2020-10-20 NOTE — Progress Notes (Signed)
Subjective:    Patient ID: Kenneth [name], male    DOB: 08-31-1956, 64 y.o.   MRN: [id number]  HPI Pt returns for f/u of diabetes mellitus:  DM type: 1 Dx'ed: 8144 Complications: renal insufficiency.  Therapy: insulin since dx DKA: twice: at the time of dx, and approx 2011.    Pancreatitis: never.   SDOH: He does not check cbg's; pt says DKA episodes were related to alcoholism; he no longer consumes EtOH Other: ; in early 2015, he was changed to a qd insulin regimen, due to poor results with multiple daily injections; he works 2nd shift, setting up for events; he has seen CDE, to verify injection technique; fructosamine has confirmed A1c.   Interval history: Pt says he never misses the insulin.  pt states he feels well in general.   Past Medical History:  Diagnosis Date  . Alcohol abuse   . Angina   . Chronic kidney disease   . Diabetes mellitus    sees Dr. Renato Shin   . Hyperlipidemia   . Hypertension     Past Surgical History:  Procedure Laterality Date  . COLONOSCOPY  05/06/2014   per Dr. Henrene Pastor, clear, repeat in 10 yrs   . LAPAROSCOPIC APPENDECTOMY N/A 04/06/2017   Procedure: APPENDECTOMY LAPAROSCOPIC with primary repair of umbilical hernia;  Surgeon: Georganna Skeans, MD;  Location: [facility] OR;  Service: General;  Laterality: N/A;    Social History   Socioeconomic History  . Marital status: Single    Spouse name: Not on file  . Number of children: Not on file  . Years of education: Not on file  . Highest education level: Not on file  Occupational History  . Not on file  Tobacco Use  . Smoking status: Former Smoker    Packs/day: 0.50    Types: Cigarettes    Quit date: 01/30/2012    Years since quitting: 8.7  . Smokeless tobacco: Never Used  Substance and Sexual Activity  . Alcohol use: Yes    Alcohol/week: 0.0 standard drinks    Comment: rare  . Drug use: No  . Sexual activity: Not on file  Other Topics Concern  . Not on file  Social History Narrative  .  Not on file   Social Determinants of Health   Financial Resource Strain:   . Difficulty of Paying Living Expenses: Not on file  Food Insecurity:   . Worried About Charity fundraiser in the Last Year: Not on file  . Ran Out of Food in the Last Year: Not on file  Transportation Needs:   . Lack of Transportation (Medical): Not on file  . Lack of Transportation (Non-Medical): Not on file  Physical Activity:   . Days of Exercise per Week: Not on file  . Minutes of Exercise per Session: Not on file  Stress:   . Feeling of Stress : Not on file  Social Connections:   . Frequency of Communication with Friends and Family: Not on file  . Frequency of Social Gatherings with Friends and Family: Not on file  . Attends Religious Services: Not on file  . Active Member of Clubs or Organizations: Not on file  . Attends Archivist Meetings: Not on file  . Marital Status: Not on file  Intimate Partner Violence:   . Fear of Current or Ex-Partner: Not on file  . Emotionally Abused: Not on file  . Physically Abused: Not on file  . Sexually Abused: Not on file  Current Outpatient Medications on File Prior to Visit  Medication Sig Dispense Refill  . acetaminophen (TYLENOL) 500 MG tablet Take 1,000 mg by mouth every 6 (six) hours as needed for mild pain.     . ferrous gluconate (FERGON) 324 MG tablet Take 1 tablet (324 mg total) by mouth daily with breakfast. 60 tablet 5  . glucose blood (ONETOUCH VERIO) test strip 1 each by Other route 2 (two) times daily. And lancets 2/day. 100 each 12  . Insulin Glargine (BASAGLAR KWIKPEN) 100 UNIT/ML Inject 0.5 mLs (50 Units total) into the skin every morning. And pen needles 1/day.    . losartan (COZAAR) 25 MG tablet TAKE 1 TABLET BY MOUTH ONCE DAILY 90 tablet 3  . naproxen sodium (ANAPROX) 220 MG tablet Take 440 mg by mouth 2 (two) times daily as needed (pain).    . simvastatin (ZOCOR) 20 MG tablet Take 1 tablet (20 mg total) by mouth at bedtime. 90  tablet 3   No current facility-administered medications on file prior to visit.    Allergies  Allergen Reactions  . Metformin And Related Nausea And Vomiting    Family History  Problem Relation Age of Onset  . Diabetes Father   . Hypertension Father   . Alcohol abuse Father   . Diabetes Sister   . Breast cancer Mother   . Colon cancer Neg Hx   . Pancreatic cancer Neg Hx   . Stomach cancer Neg Hx     BP (!) 148/70   Ht 5\' 11"  (1.803 m)   Wt 222 lb (100.7 kg)   BMI 30.96 kg/m    Review of Systems He denies hypoglycemia.      Objective:   Physical Exam VITAL SIGNS:  See vs page.  GENERAL: no distress.   Pulses: dorsalis pedis intact bilat.   MSK: no deformity of the feet CV: 1+ bilat leg edema.   Skin:  no ulcer on the feet.  normal color and temp on the feet.   Neuro: sensation is intact to touch on the feet.   Ext: there is bilateral onychomycosis of the toenails.     Lab Results  Component Value Date   HGBA1C 4.9 08/20/2020       Assessment & Plan:  HTN: is noted today Type 1 DM, with CRI: overcontrolled.  Patient Instructions  Your blood pressure is high today.  Please see your primary care provider soon, to have it rechecked Please reduce the insulin to 45 units each morning.    check your blood sugar twice a day.  vary the time of day when you check, between before the 3 meals, and at bedtime.  also check if you have symptoms of your blood sugar being too high or too low.  please keep a record of the readings and bring it to your next appointment here (or you can bring the meter itself).  You can write it on any piece of paper.  please call us sooner if your blood sugar goes below 70, or if you have a lot of readings over 200. Please come back for a follow-up appointment in 3 months.

## 2020-10-20 NOTE — Patient Instructions (Addendum)
Your blood pressure is high today.  Please see your primary care provider soon, to have it rechecked Please reduce the insulin to 45 units each morning.    check your blood sugar twice a day.  vary the time of day when you check, between before the 3 meals, and at bedtime.  also check if you have symptoms of your blood sugar being too high or too low.  please keep a record of the readings and bring it to your next appointment here (or you can bring the meter itself).  You can write it on any piece of paper.  please call us sooner if your blood sugar goes below 70, or if you have a lot of readings over 200. Please come back for a follow-up appointment in 3 months.

## 2020-10-21 ENCOUNTER — Ambulatory Visit: Payer: BC Managed Care – PPO | Attending: Internal Medicine

## 2020-10-21 DIAGNOSIS — Z23 Encounter for immunization: Secondary | ICD-10-CM

## 2020-10-21 NOTE — Progress Notes (Signed)
   Covid-19 Vaccination Clinic  Name:  KASEM MOZER    MRN: [id number] DOB: 13-Dec-1956  10/21/2020  Mr. Azeez was observed post Covid-19 immunization for 15 minutes without incident. He was provided with Vaccine Information Sheet and instruction to access the V-Safe system.   Mr. Vana was instructed to call 911 with any severe reactions post vaccine: Marland Kitchen Difficulty breathing  . Swelling of face and throat  . A fast heartbeat  . A bad rash all over body  . Dizziness and weakness

## 2020-11-25 ENCOUNTER — Other Ambulatory Visit: Payer: Self-pay | Admitting: Family Medicine

## 2020-12-16 ENCOUNTER — Ambulatory Visit (INDEPENDENT_AMBULATORY_CARE_PROVIDER_SITE_OTHER): Payer: BC Managed Care – PPO | Admitting: Family Medicine

## 2020-12-16 ENCOUNTER — Other Ambulatory Visit: Payer: Self-pay

## 2020-12-16 ENCOUNTER — Encounter: Payer: Self-pay | Admitting: Family Medicine

## 2020-12-16 VITALS — BP 126/84 | HR 70 | Temp 98.3°F | Ht 69.5 in | Wt 217.0 lb

## 2020-12-16 DIAGNOSIS — Z0000 Encounter for general adult medical examination without abnormal findings: Secondary | ICD-10-CM

## 2020-12-16 DIAGNOSIS — D171 Benign lipomatous neoplasm of skin and subcutaneous tissue of trunk: Secondary | ICD-10-CM

## 2020-12-16 DIAGNOSIS — Z23 Encounter for immunization: Secondary | ICD-10-CM | POA: Diagnosis not present

## 2020-12-16 DIAGNOSIS — M65331 Trigger finger, right middle finger: Secondary | ICD-10-CM

## 2020-12-16 DIAGNOSIS — L719 Rosacea, unspecified: Secondary | ICD-10-CM | POA: Diagnosis not present

## 2020-12-16 DIAGNOSIS — D649 Anemia, unspecified: Secondary | ICD-10-CM

## 2020-12-16 MED ORDER — METRONIDAZOLE 0.75 % EX GEL: 1.0000 "application " | Freq: Two times a day (BID) | CUTANEOUS | 5 refills | Status: DC

## 2020-12-16 MED ORDER — FERROUS GLUCONATE 324 (38 FE) MG PO TABS: 324.0000 mg | ORAL_TABLET | Freq: Every day | ORAL | 3 refills | Status: DC

## 2020-12-16 NOTE — Progress Notes (Signed)
Subjective:    Patient ID: Kenneth [name], male    DOB: 03/04/1956, 64 y.o.   MRN: [id number]  HPI Here for a well exam. He has a few issues to discuss. He asks me to check a lump on the right flank that has been present for years . It does not bother him. Also about 2 months ago while moving a recliner chair he injured his right 3rd finger. Now he annot straighten it out, but it is not painful. 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 He sees Dr. Everardo All regularlyl for his diabetes, and this has been well controlled. His A1c in September was 4.9.    Review of Systems  Constitutional: Negative.   HENT: Negative.   Eyes: Negative.   Respiratory: Negative.   Cardiovascular: Negative.   Gastrointestinal: Negative.   Genitourinary: Negative.   Musculoskeletal: Negative.   Skin: Positive for rash.  Neurological: Negative.   Psychiatric/Behavioral: Negative.        Objective:   Physical Exam Constitutional:      General: He is not in acute distress.    Appearance: Normal appearance. He is well-developed and well-nourished. He is not diaphoretic.  HENT:     Head: Normocephalic and atraumatic.     Right Ear: External ear normal.     Left Ear: External ear normal.     Nose: Nose normal.     Mouth/Throat:     Mouth: Oropharynx is clear and moist.     Pharynx: No oropharyngeal exudate.  Eyes:     General: No scleral icterus.       Right eye: No discharge.        Left eye: No discharge.     Extraocular Movements: EOM normal.     Conjunctiva/sclera: Conjunctivae normal.     Pupils: Pupils are equal, round, and reactive to light.  Neck:     Thyroid: No thyromegaly.     Vascular: No JVD.     Trachea: No tracheal deviation.  Cardiovascular:     Rate and Rhythm: Normal rate and regular rhythm.     Pulses: Intact distal pulses.     Heart sounds: Normal heart sounds. No murmur heard. No friction rub. No gallop.    Pulmonary:     Effort: Pulmonary effort is normal. No respiratory distress.     Breath sounds: Normal breath sounds. No wheezing or rales.  Chest:     Chest wall: No tenderness.  Abdominal:     General: Bowel sounds are normal. There is no distension.     Palpations: Abdomen is soft. There is no mass.     Tenderness: There is no abdominal tenderness. There is no guarding or rebound.  Genitourinary:    Penis: Normal. No tenderness.      Prostate: Normal.     Rectum: Normal. Guaiac result negative.  Musculoskeletal:        General: No tenderness or edema. Normal range of motion.     Cervical back: Neck supple.     Comments: He has a trigger finger in the right 3rd finger at the DIP joint   Lymphadenopathy:     Cervical: No cervical adenopathy.  Skin:    General: Skin is warm and dry.     Coloration: Skin is not pale.     Findings: No erythema.     Comments: There a a few small papules on the  chin. There is a non-tender lipoma on the right flank   Neurological:     Mental Status: He is alert and oriented to person, place, and time.     Cranial Nerves: No cranial nerve deficit.     Motor: No abnormal muscle tone.     Coordination: Coordination normal.     Deep Tendon Reflexes: Reflexes are normal and symmetric. Reflexes normal.  Psychiatric:        Mood and Affect: Mood and affect normal.        Behavior: Behavior normal.        Thought Content: Thought content normal.        Judgment: Judgment normal.           Assessment & Plan:  Well exam. We discussed diet and exercise. get fasting labs. He will watch the trigger finger for now and follow up as needed. 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 He has a bening lipoma on the flank.  Gershon Crane, MD

## 2020-12-16 NOTE — Addendum Note (Signed)
Addended by: Leonette Nutting on: 12/16/2020 02:01 PM   Modules accepted: Orders

## 2020-12-17 LAB — CBC WITH DIFFERENTIAL/PLATELET
Absolute Monocytes: 603 cells/uL (ref 200–950)
Basophils Absolute: 12 cells/uL (ref 0–200)
Basophils Relative: 0.2 %
Eosinophils Absolute: 58 cells/uL (ref 15–500)
Eosinophils Relative: 1 %
HCT: 32.3 % — ABNORMAL LOW (ref 38.5–50.0)
Hemoglobin: 10.6 g/dL — ABNORMAL LOW (ref 13.2–17.1)
Lymphs Abs: 1926 cells/uL (ref 850–3900)
MCH: 28.2 pg (ref 27.0–33.0)
MCHC: 32.8 g/dL (ref 32.0–36.0)
MCV: 85.9 fL (ref 80.0–100.0)
MPV: 10.4 fL (ref 7.5–12.5)
Monocytes Relative: 10.4 %
Neutro Abs: 3202 cells/uL (ref 1500–7800)
Neutrophils Relative %: 55.2 %
Platelets: 237 10*3/uL (ref 140–400)
RBC: 3.76 10*6/uL — ABNORMAL LOW (ref 4.20–5.80)
RDW: 15 % (ref 11.0–15.0)
Total Lymphocyte: 33.2 %
WBC: 5.8 10*3/uL (ref 3.8–10.8)

## 2020-12-17 LAB — HEPATIC FUNCTION PANEL
AG Ratio: 1.3 (calc) (ref 1.0–2.5)
ALT: 18 U/L (ref 9–46)
AST: 17 U/L (ref 10–35)
Albumin: 4 g/dL (ref 3.6–5.1)
Alkaline phosphatase (APISO): 92 U/L (ref 35–144)
Bilirubin, Direct: 0.2 mg/dL (ref 0.0–0.2)
Globulin: 3.1 g/dL (calc) (ref 1.9–3.7)
Indirect Bilirubin: 0.5 mg/dL (calc) (ref 0.2–1.2)
Total Bilirubin: 0.7 mg/dL (ref 0.2–1.2)
Total Protein: 7.1 g/dL (ref 6.1–8.1)

## 2020-12-17 LAB — BASIC METABOLIC PANEL WITH GFR
BUN: 11 mg/dL (ref 7–25)
CO2: 28 mmol/L (ref 20–32)
Calcium: 9.3 mg/dL (ref 8.6–10.3)
Chloride: 102 mmol/L (ref 98–110)
Creat: 0.87 mg/dL (ref 0.70–1.25)
GFR, Est African American: 107 mL/min/{1.73_m2} (ref >=60)
GFR, Est Non African American: 92 mL/min/{1.73_m2} (ref >=60)
Glucose, Bld: 93 mg/dL (ref 65–99)
Potassium: 4.3 mmol/L (ref 3.5–5.3)
Sodium: 136 mmol/L (ref 135–146)

## 2020-12-17 LAB — TSH: TSH: 2.25 mIU/L (ref 0.40–4.50)

## 2020-12-17 LAB — LIPID PANEL
Cholesterol: 134 mg/dL (ref <200)
HDL: 59 mg/dL (ref >=40)
LDL Cholesterol (Calc): 62 mg/dL (calc)
Non-HDL Cholesterol (Calc): 75 mg/dL (calc) (ref <130)
Total CHOL/HDL Ratio: 2.3 (calc) (ref <5.0)
Triglycerides: 53 mg/dL (ref <150)

## 2020-12-17 LAB — PSA: PSA: 3.49 ng/mL (ref <=4.0)

## 2020-12-17 NOTE — Addendum Note (Signed)
Addended by: Gershon Crane A on: 12/17/2020 07:39 AM   Modules accepted: Orders

## 2020-12-18 ENCOUNTER — Other Ambulatory Visit: Payer: Self-pay

## 2020-12-18 ENCOUNTER — Other Ambulatory Visit (INDEPENDENT_AMBULATORY_CARE_PROVIDER_SITE_OTHER): Payer: BC Managed Care – PPO

## 2020-12-18 DIAGNOSIS — D649 Anemia, unspecified: Secondary | ICD-10-CM | POA: Diagnosis not present

## 2020-12-18 LAB — IBC + FERRITIN
Ferritin: 8.3 ng/mL — ABNORMAL LOW (ref 22.0–322.0)
Iron: 154 ug/dL (ref 42–165)
Saturation Ratios: 39.9 % (ref 20.0–50.0)
Transferrin: 276 mg/dL (ref 212.0–360.0)

## 2020-12-18 LAB — FOLATE: Folate: 16.5 ng/mL (ref >5.9)

## 2020-12-18 LAB — VITAMIN B12: Vitamin B-12: 448 pg/mL (ref 211–911)

## 2020-12-18 LAB — IRON: Iron: 154 ug/dL (ref 42–165)

## 2020-12-18 NOTE — Addendum Note (Signed)
Addended by: Lerry Liner on: 12/18/2020 11:11 AM   Modules accepted: Orders

## 2020-12-25 ENCOUNTER — Telehealth: Payer: Self-pay

## 2020-12-25 MED ORDER — FERROUS GLUCONATE 324 (38 FE) MG PO TABS: 324.0000 mg | ORAL_TABLET | Freq: Two times a day (BID) | ORAL | 3 refills | Status: DC

## 2020-12-25 NOTE — Telephone Encounter (Signed)
-----   Message from [name], MD sent at 12/24/2020  8:00 AM EST ----- His anemia is from a low iron level. Tell him to increase the ferrous sulfate to BID. Call in #[phone] with 3 rf. Recheck a CBC and ferritin in 90 days

## 2020-12-25 NOTE — Telephone Encounter (Signed)
Informed patient of results and recommendations. 

## 2021-01-13 ENCOUNTER — Telehealth: Payer: Self-pay | Admitting: Family Medicine

## 2021-01-13 NOTE — Telephone Encounter (Signed)
Pt call and stated he want a refill on  metroNIDAZOLE (METROGEL) 0.75 % gel sent to  metroNIDAZOLE (METROGEL) 0.75 % gel [place] ([place] [place] Phone:  [phone]  Fax:  [phone]

## 2021-01-14 ENCOUNTER — Other Ambulatory Visit: Payer: Self-pay

## 2021-01-14 DIAGNOSIS — L719 Rosacea, unspecified: Secondary | ICD-10-CM

## 2021-01-14 MED ORDER — METRONIDAZOLE 0.75 % EX GEL: 1.0000 "application " | Freq: Two times a day (BID) | CUTANEOUS | 12 refills | Status: AC

## 2021-01-14 NOTE — Telephone Encounter (Signed)
Please refill this for one year  

## 2021-01-16 ENCOUNTER — Other Ambulatory Visit: Payer: Self-pay

## 2021-01-20 ENCOUNTER — Ambulatory Visit: Payer: BC Managed Care – PPO | Admitting: Endocrinology

## 2021-02-16 ENCOUNTER — Other Ambulatory Visit: Payer: Self-pay

## 2021-02-17 ENCOUNTER — Other Ambulatory Visit: Payer: Self-pay | Admitting: Family Medicine

## 2021-02-18 ENCOUNTER — Other Ambulatory Visit: Payer: Self-pay

## 2021-02-18 ENCOUNTER — Ambulatory Visit (INDEPENDENT_AMBULATORY_CARE_PROVIDER_SITE_OTHER): Payer: BC Managed Care – PPO | Admitting: Endocrinology

## 2021-02-18 VITALS — BP 140/82 | HR 58 | Ht 71.0 in | Wt 219.8 lb

## 2021-02-18 DIAGNOSIS — N182 Chronic kidney disease, stage 2 (mild): Secondary | ICD-10-CM

## 2021-02-18 DIAGNOSIS — E1022 Type 1 diabetes mellitus with diabetic chronic kidney disease: Secondary | ICD-10-CM | POA: Diagnosis not present

## 2021-02-18 LAB — POCT GLYCOSYLATED HEMOGLOBIN (HGB A1C): Hemoglobin A1C: 6.6 % — AB (ref 4.0–5.6)

## 2021-02-18 MED ORDER — BASAGLAR KWIKPEN 100 UNIT/ML ~~LOC~~ SOPN
36.0000 [IU] | PEN_INJECTOR | SUBCUTANEOUS | 11 refills | Status: DC
Start: 2021-02-18 — End: 2021-05-20

## 2021-02-18 NOTE — Patient Instructions (Addendum)
Your blood pressure is high today.  Please see your primary care provider soon, to have it rechecked.   Please reduce the insulin to 36 units each morning.    check your blood sugar twice a day.  vary the time of day when you check, between before the 3 meals, and at bedtime.  also check if you have symptoms of your blood sugar being too high or too low.  please keep a record of the readings and bring it to your next appointment here (or you can bring the meter itself).  You can write it on any piece of paper.  please call us sooner if your blood sugar goes below 70, or if you have a lot of readings over 200. Please come back for a follow-up appointment in 3 months.

## 2021-02-18 NOTE — Progress Notes (Signed)
Subjective:    Patient ID: Kenneth [name], male    DOB: Nov 27, 1956, 65 y.o.   MRN: [id number]  HPI Pt returns for f/u of diabetes mellitus:  DM type: 1 Dx'ed: 6237 Complications: renal insufficiency.  Therapy: insulin since dx DKA: twice: at the time of dx, and approx 2011.    Pancreatitis: never.   SDOH: He does not check cbg's; pt says DKA episodes were related to alcoholism; he no longer consumes EtOH. Other: ; in early 2015, he was changed to a qd insulin regimen, due to poor results with multiple daily injections; he works 2nd shift, setting up for events; he has seen CDE, to verify injection technique; fructosamine has confirmed A1c.   Interval history: Pt says he never misses the insulin.  pt states he feels well in general.  He has decreased insulin to 40 units qam. Past Medical History:  Diagnosis Date  . Alcohol abuse   . Angina   . Chronic kidney disease   . Diabetes mellitus    sees Dr. Renato Shin   . Hyperlipidemia   . Hypertension     Past Surgical History:  Procedure Laterality Date  . COLONOSCOPY  05/06/2014   per Dr. Henrene Pastor, clear, repeat in 10 yrs   . LAPAROSCOPIC APPENDECTOMY N/A 04/06/2017   Procedure: APPENDECTOMY LAPAROSCOPIC with primary repair of umbilical hernia;  Surgeon: Georganna Skeans, MD;  Location: [facility] OR;  Service: General;  Laterality: N/A;    Social History   Socioeconomic History  . Marital status: Single    Spouse name: Not on file  . Number of children: Not on file  . Years of education: Not on file  . Highest education level: Not on file  Occupational History  . Not on file  Tobacco Use  . Smoking status: Former Smoker    Packs/day: 0.50    Types: Cigarettes    Quit date: 01/30/2012    Years since quitting: 9.0  . Smokeless tobacco: Never Used  Substance and Sexual Activity  . Alcohol use: Yes    Alcohol/week: 0.0 standard drinks    Comment: rare  . Drug use: No  . Sexual activity: Not on file  Other Topics Concern  .  Not on file  Social History Narrative  . Not on file   Social Determinants of Health   Financial Resource Strain: Not on file  Food Insecurity: Not on file  Transportation Needs: Not on file  Physical Activity: Not on file  Stress: Not on file  Social Connections: Not on file  Intimate Partner Violence: Not on file    Current Outpatient Medications on File Prior to Visit  Medication Sig Dispense Refill  . acetaminophen (TYLENOL) 500 MG tablet Take 1,000 mg by mouth every 6 (six) hours as needed for mild pain.     . ferrous gluconate (FERGON) 324 MG tablet Take 1 tablet (324 mg total) by mouth 2 (two) times daily with a meal. 180 tablet 3  . glucose blood (ONETOUCH VERIO) test strip 1 each by Other route 2 (two) times daily. And lancets 2/day. 100 each 12  . losartan (COZAAR) 25 MG tablet Take 1 tablet by mouth once daily 90 tablet 0  . metroNIDAZOLE (METROGEL) 0.75 % gel Apply 1 application topically 2 (two) times daily. 45 g 12  . naproxen sodium (ANAPROX) 220 MG tablet Take 440 mg by mouth 2 (two) times daily as needed (pain).    . simvastatin (ZOCOR) 20 MG tablet TAKE 1 TABLET  BY MOUTH AT BEDTIME 90 tablet 0   No current facility-administered medications on file prior to visit.    Allergies  Allergen Reactions  . Metformin And Related Nausea And Vomiting    Family History  Problem Relation Age of Onset  . Diabetes Father   . Hypertension Father   . Alcohol abuse Father   . Diabetes Sister   . Breast cancer Mother   . Colon cancer Neg Hx   . Pancreatic cancer Neg Hx   . Stomach cancer Neg Hx     BP 140/82 (BP Location: Right Arm, Patient Position: Sitting, Cuff Size: Large)   Pulse (!) 58   Ht 5\' 11"  (1.803 m)   Wt 219 lb 12.8 oz (99.7 kg)   SpO2 98%   BMI 30.66 kg/m    Review of Systems He denies hypoglycemia.      Objective:   Physical Exam VITAL SIGNS:  See vs page.  GENERAL: no distress.   Pulses: dorsalis pedis intact bilat.   MSK: no deformity of  the feet CV: trace bilat leg edema.   Skin:  no ulcer on the feet.  normal color and temp on the feet.   Neuro: sensation is intact to touch on the feet.   Ext: there is bilateral onychomycosis of the toenails.     Lab Results  Component Value Date   HGBA1C 6.6 (A) 02/18/2021   Lab Results  Component Value Date   CREATININE 0.87 12/16/2020   BUN 11 12/16/2020   NA 136 12/16/2020   K 4.3 12/16/2020   CL 102 12/16/2020   CO2 28 12/16/2020       Assessment & Plan:  HTN: is noted today Type 1 DM, with CRI: overcontrolled  Patient Instructions  Your blood pressure is high today.  Please see your primary care provider soon, to have it rechecked.   Please reduce the insulin to 36 units each morning.    check your blood sugar twice a day.  vary the time of day when you check, between before the 3 meals, and at bedtime.  also check if you have symptoms of your blood sugar being too high or too low.  please keep a record of the readings and bring it to your next appointment here (or you can bring the meter itself).  You can write it on any piece of paper.  please call us sooner if your blood sugar goes below 70, or if you have a lot of readings over 200. Please come back for a follow-up appointment in 3 months.

## 2021-05-20 ENCOUNTER — Ambulatory Visit: Payer: BC Managed Care – PPO | Admitting: Endocrinology

## 2021-05-20 ENCOUNTER — Other Ambulatory Visit: Payer: Self-pay

## 2021-05-20 VITALS — BP 120/60 | HR 60 | Ht 71.0 in | Wt 220.6 lb

## 2021-05-20 DIAGNOSIS — E1022 Type 1 diabetes mellitus with diabetic chronic kidney disease: Secondary | ICD-10-CM | POA: Diagnosis not present

## 2021-05-20 DIAGNOSIS — N182 Chronic kidney disease, stage 2 (mild): Secondary | ICD-10-CM

## 2021-05-20 LAB — POCT GLYCOSYLATED HEMOGLOBIN (HGB A1C): Hemoglobin A1C: 6.5 % — AB (ref 4.0–5.6)

## 2021-05-20 MED ORDER — BASAGLAR KWIKPEN 100 UNIT/ML ~~LOC~~ SOPN: 32.0000 [IU] | PEN_INJECTOR | SUBCUTANEOUS | 3 refills | Status: DC

## 2021-05-20 NOTE — Patient Instructions (Addendum)
Please reduce the insulin to 32 units each morning.   check your blood sugar twice a day.  vary the time of day when you check, between before the 3 meals, and at bedtime.  also check if you have symptoms of your blood sugar being too high or too low.  please keep a record of the readings and bring it to your next appointment here (or you can bring the meter itself).  You can write it on any piece of paper.  please call us sooner if your blood sugar goes below 70, or if you have a lot of readings over 200.   Please come back for a follow-up appointment in 3 months.       Hypoglycemia Hypoglycemia occurs when the level of sugar (glucose) in the blood is too low. Hypoglycemia can happen in people who have or do not have diabetes. It can develop quickly, and it can be a medical emergency. For most people, a blood glucose level below 70 mg/dL (3.9 mmol/L) is considered hypoglycemia. Glucose is a type of sugar that provides the body's main source of energy. Certain hormones (insulin and glucagon) control the level of glucose in the blood. Insulin lowers blood glucose, and glucagon raises blood glucose. Hypoglycemia can result from having too much insulin in the bloodstream, or from not eating enough food that contains glucose. You may also have reactive hypoglycemia, which happens within 4 hours after eating a meal. What are the causes? Hypoglycemia occurs most often in people who have diabetes and may be caused by:  Diabetes medicine.  Not eating enough, or not eating often enough.  Increased physical activity.  Drinking alcohol on an empty stomach. If you do not have diabetes, hypoglycemia may be caused by:  A tumor in the pancreas.  Not eating enough, or not eating for long periods at a time (fasting).  A severe infection or illness.  Problems after having bariatric surgery.  Organ failure, such as kidney or liver failure.  Certain medicines. What increases the risk? Hypoglycemia is  more likely to develop in people who:  Have diabetes and take medicines to lower blood glucose.  Abuse alcohol.  Have a severe illness. What are the signs or symptoms? Symptoms vary depending on whether the condition is mild, moderate, or severe. Mild hypoglycemia  Hunger.  Anxiety.  Sweating and feeling clammy.  Dizziness or feeling light-headed.  Sleepiness or restless sleep.  Nausea.  Increased heart rate.  Headache.  Blurry vision.  Irritability.  Tingling or numbness around the mouth, lips, or tongue.  A change in coordination. Moderate hypoglycemia  Confusion and poor judgment.  Behavior changes.  Weakness.  Irregular heartbeat. Severe hypoglycemia Severe hypoglycemia is a medical emergency. It can cause:  Fainting.  Seizures.  Loss of consciousness (coma).  Death. How is this diagnosed? Hypoglycemia is diagnosed with a blood test to measure your blood glucose level. This blood test is done while you are having symptoms. Your health care provider may also do a physical exam and review your medical history. How is this treated? This condition can be treated by immediately eating or drinking something that contains sugar with 15 grams of rapid-acting carbohydrate, such as:  4 oz (120 mL) of fruit juice.  4-6 oz (120-150 mL) of regular soda (not diet soda).  8 oz (240 mL) of low-fat milk.  Several pieces of hard candy. Check food labels to find out how many to eat for 15 grams.  1 Tbsp (15 mL)  of sugar or honey. Treating hypoglycemia if you have diabetes If you are alert and able to swallow safely, follow the 15:15 rule:  Take 15 grams of a rapid-acting carbohydrate. Talk with your health care provider about how much you should take. Options for getting 15 grams of rapid-acting carbohydrate include: ? Glucose tablets (take 4 tablets). ? Several pieces of hard candy. Check food labels to find out how many pieces to eat for 15 grams. ? 4 oz  (120 mL) of fruit juice. ? 4-6 oz (120-150 mL) of regular (not diet) soda. ? 1 Tbsp (15 mL) of honey or sugar.  Check your blood glucose 15 minutes after you take the carbohydrate.  If the repeat blood glucose level is still at or below 70 mg/dL (3.9 mmol/L), take 15 grams of a carbohydrate again.  If your blood glucose level does not increase above 70 mg/dL (3.9 mmol/L) after 3 tries, seek emergency medical care.  After your blood glucose level returns to normal, eat a meal or a snack within 1 hour.   Treating severe hypoglycemia Severe hypoglycemia is when your blood glucose level is at or below 54 mg/dL (3 mmol/L). Severe hypoglycemia is a medical emergency. Get medical help right away. If you have severe hypoglycemia and you cannot eat or drink, you will need to be given glucagon. A family member or close friend should learn how to check your blood glucose and how to give you glucagon. Ask your health care provider if you need to have an emergency glucagon kit available. Severe hypoglycemia may need to be treated in a hospital. The treatment may include getting glucose through an IV. You may also need treatment for the cause of your hypoglycemia. Follow these instructions at home: General instructions  Take over-the-counter and prescription medicines only as told by your health care provider.  Monitor your blood glucose as told by your health care provider.  If you drink alcohol: ? Limit how much you use to:  0-1 drink a day for nonpregnant women.  0-2 drinks a day for men. ? Be aware of how much alcohol is in your drink. In the [place] one drink equals one 12 oz bottle of beer (355 mL), one 5 oz glass of wine (148 mL), or one 1 oz glass of hard liquor (44 mL).  Keep all follow-up visits as told by your health care provider. This is important. If you have diabetes:  Always have a rapid-acting carbohydrate (15 grams) option with you to treat low blood glucose.  Follow your  diabetes management plan as directed. Make sure you: ? Know the symptoms of hypoglycemia. It is important to treat it right away to prevent it from becoming severe. ? Check your blood glucose as often as told. Always check before and after exercise. ? Always check your blood glucose before you drive a motorized vehicle. ? Take your medicines as told. ? Follow your meal plan. Eat on time, and do not skip meals.  Share your diabetes management plan with people in your workplace, school, and household.  Carry a medical alert card or wear medical alert jewelry.   Contact a health care provider if:  You have problems keeping your blood glucose in your target range.  You have frequent episodes of hypoglycemia. Get help right away if:  You continue to have hypoglycemia symptoms after eating or drinking something that contains 15 grams of fast-acting carbohydrate and you cannot get your blood glucose above 70 mg/dL (3.9 mmol/L) while  following the 15:15 rule.  Your blood glucose is at or below 54 mg/dL (3 mmol/L).  You have a seizure.  You faint. These symptoms may represent a serious problem that is an emergency. Do not wait to see if the symptoms will go away. Get medical help right away. Call your local emergency services (911 in the [place] Do not drive yourself to the hospital. Summary  Hypoglycemia occurs when the level of sugar (glucose) in the blood is too low.  Hypoglycemia can happen in people who have or do not have diabetes. It can develop quickly, and it can be a medical emergency.  Make sure you know the symptoms of hypoglycemia and how to treat it.  Always have a rapid-acting carbohydrate snack with you to treat low blood sugar. This information is not intended to replace advice given to you by your health care provider. Make sure you discuss any questions you have with your health care provider. Document Revised: 10/31/2019 Document Reviewed: 10/31/2019 Elsevier Patient  Education  2021 [employer].

## 2021-05-20 NOTE — Progress Notes (Signed)
Subjective:    Patient ID: Kenneth [name], male    DOB: August 01, 1956, 65 y.o.   MRN: [id number]  HPI Pt returns for f/u of diabetes mellitus:  DM type: 1 Dx'ed: 2395 Complications: CRI.  Therapy: insulin since dx.   DKA: twice: at the time of dx, and approx 2011.    Pancreatitis: never.   SDOH: He does not check cbg's; pt says DKA episodes were related to alcoholism; he no longer consumes EtOH. Other: in early 2015, he was changed to a qd insulin regimen, due to poor results with multiple daily injections; he works 2nd shift, setting up for events; he has seen CDE, to verify injection technique; fructosamine has confirmed A1c.   Interval history: Pt says he never misses the insulin.  pt states he feels well in general.   Past Medical History:  Diagnosis Date  . Alcohol abuse   . Angina   . Chronic kidney disease   . Diabetes mellitus    sees Dr. Renato Shin   . Hyperlipidemia   . Hypertension     Past Surgical History:  Procedure Laterality Date  . COLONOSCOPY  05/06/2014   per Dr. Henrene Pastor, clear, repeat in 10 yrs   . LAPAROSCOPIC APPENDECTOMY N/A 04/06/2017   Procedure: APPENDECTOMY LAPAROSCOPIC with primary repair of umbilical hernia;  Surgeon: Georganna Skeans, MD;  Location: [facility];  Service: General;  Laterality: N/A;    Social History   Socioeconomic History  . Marital status: Single    Spouse name: Not on file  . Number of children: Not on file  . Years of education: Not on file  . Highest education level: Not on file  Occupational History  . Not on file  Tobacco Use  . Smoking status: Former Smoker    Packs/day: 0.50    Types: Cigarettes    Quit date: 01/30/2012    Years since quitting: 9.3  . Smokeless tobacco: Never Used  Substance and Sexual Activity  . Alcohol use: Yes    Alcohol/week: 0.0 standard drinks    Comment: rare  . Drug use: No  . Sexual activity: Not on file  Other Topics Concern  . Not on file  Social History Narrative  . Not on file    Social Determinants of Health   Financial Resource Strain: Not on file  Food Insecurity: Not on file  Transportation Needs: Not on file  Physical Activity: Not on file  Stress: Not on file  Social Connections: Not on file  Intimate Partner Violence: Not on file    Current Outpatient Medications on File Prior to Visit  Medication Sig Dispense Refill  . acetaminophen (TYLENOL) 500 MG tablet Take 1,000 mg by mouth every 6 (six) hours as needed for mild pain.     . ferrous gluconate (FERGON) 324 MG tablet Take 1 tablet (324 mg total) by mouth 2 (two) times daily with a meal. 180 tablet 3  . glucose blood (ONETOUCH VERIO) test strip 1 each by Other route 2 (two) times daily. And lancets 2/day. 100 each 12  . losartan (COZAAR) 25 MG tablet Take 1 tablet by mouth once daily 90 tablet 0  . metroNIDAZOLE (METROGEL) 0.75 % gel Apply 1 application topically 2 (two) times daily. 45 g 12  . naproxen sodium (ANAPROX) 220 MG tablet Take 440 mg by mouth 2 (two) times daily as needed (pain).    . simvastatin (ZOCOR) 20 MG tablet TAKE 1 TABLET BY MOUTH AT BEDTIME 90 tablet 0  No current facility-administered medications on file prior to visit.    Allergies  Allergen Reactions  . Metformin And Related Nausea And Vomiting    Family History  Problem Relation Age of Onset  . Diabetes Father   . Hypertension Father   . Alcohol abuse Father   . Diabetes Sister   . Breast cancer Mother   . Colon cancer Neg Hx   . Pancreatic cancer Neg Hx   . Stomach cancer Neg Hx     BP 120/60 (BP Location: Right Arm, Patient Position: Sitting, Cuff Size: Large)   Pulse 60   Ht 5' 11" (1.803 m)   Wt 220 lb 9.6 oz (100.1 kg)   SpO2 96%   BMI 30.77 kg/m    Review of Systems He denies hypoglycemia    Objective:   Physical Exam VITAL SIGNS:  See vs page.  GENERAL: no distress.   Pulses: dorsalis pedis intact bilat.   MSK: no deformity of the feet CV: trace bilat leg edema.   Skin:  no ulcer on the  feet, but the skin is dry.  normal color and temp on the feet.   Neuro: sensation is intact to touch on the feet.   Ext: there is bilateral onychomycosis of the toenails.     Lab Results  Component Value Date   HGBA1C 6.5 (A) 05/20/2021       Assessment & Plan:  Type 1 DM: overcontrolled.     Patient Instructions   Please reduce the insulin to 32 units each morning.   check your blood sugar twice a day.  vary the time of day when you check, between before the 3 meals, and at bedtime.  also check if you have symptoms of your blood sugar being too high or too low.  please keep a record of the readings and bring it to your next appointment here (or you can bring the meter itself).  You can write it on any piece of paper.  please call us sooner if your blood sugar goes below 70, or if you have a lot of readings over 200.   Please come back for a follow-up appointment in 3 months.       Hypoglycemia Hypoglycemia occurs when the level of sugar (glucose) in the blood is too low. Hypoglycemia can happen in people who have or do not have diabetes. It can develop quickly, and it can be a medical emergency. For most people, a blood glucose level below 70 mg/dL (3.9 mmol/L) is considered hypoglycemia. Glucose is a type of sugar that provides the body's main source of energy. Certain hormones (insulin and glucagon) control the level of glucose in the blood. Insulin lowers blood glucose, and glucagon raises blood glucose. Hypoglycemia can result from having too much insulin in the bloodstream, or from not eating enough food that contains glucose. You may also have reactive hypoglycemia, which happens within 4 hours after eating a meal. What are the causes? Hypoglycemia occurs most often in people who have diabetes and may be caused by:  Diabetes medicine.  Not eating enough, or not eating often enough.  Increased physical activity.  Drinking alcohol on an empty stomach. If you do not have  diabetes, hypoglycemia may be caused by:  A tumor in the pancreas.  Not eating enough, or not eating for long periods at a time (fasting).  A severe infection or illness.  Problems after having bariatric surgery.  Organ failure, such as kidney or liver failure.  Certain medicines. What increases the risk? Hypoglycemia is more likely to develop in people who:  Have diabetes and take medicines to lower blood glucose.  Abuse alcohol.  Have a severe illness. What are the signs or symptoms? Symptoms vary depending on whether the condition is mild, moderate, or severe. Mild hypoglycemia  Hunger.  Anxiety.  Sweating and feeling clammy.  Dizziness or feeling light-headed.  Sleepiness or restless sleep.  Nausea.  Increased heart rate.  Headache.  Blurry vision.  Irritability.  Tingling or numbness around the mouth, lips, or tongue.  A change in coordination. Moderate hypoglycemia  Confusion and poor judgment.  Behavior changes.  Weakness.  Irregular heartbeat. Severe hypoglycemia Severe hypoglycemia is a medical emergency. It can cause:  Fainting.  Seizures.  Loss of consciousness (coma).  Death. How is this diagnosed? Hypoglycemia is diagnosed with a blood test to measure your blood glucose level. This blood test is done while you are having symptoms. Your health care provider may also do a physical exam and review your medical history. How is this treated? This condition can be treated by immediately eating or drinking something that contains sugar with 15 grams of rapid-acting carbohydrate, such as:  4 oz (120 mL) of fruit juice.  4-6 oz (120-150 mL) of regular soda (not diet soda).  8 oz (240 mL) of low-fat milk.  Several pieces of hard candy. Check food labels to find out how many to eat for 15 grams.  1 Tbsp (15 mL) of sugar or honey. Treating hypoglycemia if you have diabetes If you are alert and able to swallow safely, follow the 15:15  rule:  Take 15 grams of a rapid-acting carbohydrate. Talk with your health care provider about how much you should take. Options for getting 15 grams of rapid-acting carbohydrate include: ? Glucose tablets (take 4 tablets). ? Several pieces of hard candy. Check food labels to find out how many pieces to eat for 15 grams. ? 4 oz (120 mL) of fruit juice. ? 4-6 oz (120-150 mL) of regular (not diet) soda. ? 1 Tbsp (15 mL) of honey or sugar.  Check your blood glucose 15 minutes after you take the carbohydrate.  If the repeat blood glucose level is still at or below 70 mg/dL (3.9 mmol/L), take 15 grams of a carbohydrate again.  If your blood glucose level does not increase above 70 mg/dL (3.9 mmol/L) after 3 tries, seek emergency medical care.  After your blood glucose level returns to normal, eat a meal or a snack within 1 hour.   Treating severe hypoglycemia Severe hypoglycemia is when your blood glucose level is at or below 54 mg/dL (3 mmol/L). Severe hypoglycemia is a medical emergency. Get medical help right away. If you have severe hypoglycemia and you cannot eat or drink, you will need to be given glucagon. A family member or close friend should learn how to check your blood glucose and how to give you glucagon. Ask your health care provider if you need to have an emergency glucagon kit available. Severe hypoglycemia may need to be treated in a hospital. The treatment may include getting glucose through an IV. You may also need treatment for the cause of your hypoglycemia. Follow these instructions at home: General instructions  Take over-the-counter and prescription medicines only as told by your health care provider.  Monitor your blood glucose as told by your health care provider.  If you drink alcohol: ? Limit how much you use to:  0-1 drink a  day for nonpregnant women.  0-2 drinks a day for men. ? Be aware of how much alcohol is in your drink. In the [place] one drink equals one  12 oz bottle of beer (355 mL), one 5 oz glass of wine (148 mL), or one 1 oz glass of hard liquor (44 mL).  Keep all follow-up visits as told by your health care provider. This is important. If you have diabetes:  Always have a rapid-acting carbohydrate (15 grams) option with you to treat low blood glucose.  Follow your diabetes management plan as directed. Make sure you: ? Know the symptoms of hypoglycemia. It is important to treat it right away to prevent it from becoming severe. ? Check your blood glucose as often as told. Always check before and after exercise. ? Always check your blood glucose before you drive a motorized vehicle. ? Take your medicines as told. ? Follow your meal plan. Eat on time, and do not skip meals.  Share your diabetes management plan with people in your workplace, school, and household.  Carry a medical alert card or wear medical alert jewelry.   Contact a health care provider if:  You have problems keeping your blood glucose in your target range.  You have frequent episodes of hypoglycemia. Get help right away if:  You continue to have hypoglycemia symptoms after eating or drinking something that contains 15 grams of fast-acting carbohydrate and you cannot get your blood glucose above 70 mg/dL (3.9 mmol/L) while following the 15:15 rule.  Your blood glucose is at or below 54 mg/dL (3 mmol/L).  You have a seizure.  You faint. These symptoms may represent a serious problem that is an emergency. Do not wait to see if the symptoms will go away. Get medical help right away. Call your local emergency services (911 in the [place] Do not drive yourself to the hospital. Summary  Hypoglycemia occurs when the level of sugar (glucose) in the blood is too low.  Hypoglycemia can happen in people who have or do not have diabetes. It can develop quickly, and it can be a medical emergency.  Make sure you know the symptoms of hypoglycemia and how to treat  it.  Always have a rapid-acting carbohydrate snack with you to treat low blood sugar. This information is not intended to replace advice given to you by your health care provider. Make sure you discuss any questions you have with your health care provider. Document Revised: 10/31/2019 Document Reviewed: 10/31/2019 Elsevier Patient Education  2021 [employer].

## 2021-05-28 ENCOUNTER — Other Ambulatory Visit: Payer: Self-pay | Admitting: Family Medicine

## 2021-06-12 ENCOUNTER — Other Ambulatory Visit: Payer: Self-pay

## 2021-06-12 ENCOUNTER — Encounter: Payer: Self-pay | Admitting: Family Medicine

## 2021-06-12 ENCOUNTER — Ambulatory Visit: Payer: BC Managed Care – PPO | Admitting: Family Medicine

## 2021-06-12 VITALS — BP 120/62 | HR 67 | Temp 98.6°F | Wt 213.0 lb

## 2021-06-12 DIAGNOSIS — R1013 Epigastric pain: Secondary | ICD-10-CM

## 2021-06-12 MED ORDER — OMEPRAZOLE 40 MG PO CPDR: 40.0000 mg | DELAYED_RELEASE_CAPSULE | Freq: Every day | ORAL | 3 refills | Status: DC

## 2021-06-12 NOTE — Progress Notes (Signed)
   Subjective:    Patient ID: Kenneth [name], male    DOB: 1956-01-10, 65 y.o.   MRN: [id number]  HPI Here for 2 weeks of intermittent upper abdominal pains. No fever or nausea. His BMs are regular. Appetite is good. He tried some Mylarnta and this helped.    Review of Systems  Constitutional: Negative.   Respiratory: Negative.    Cardiovascular: Negative.   Gastrointestinal:  Positive for abdominal pain. Negative for abdominal distention, anal bleeding, blood in stool, constipation, diarrhea, nausea, rectal pain and vomiting.      Objective:   Physical Exam Constitutional:      Appearance: Normal appearance. He is not ill-appearing.  Cardiovascular:     Rate and Rhythm: Normal rate and regular rhythm.     Pulses: Normal pulses.     Heart sounds: Normal heart sounds.  Pulmonary:     Effort: Pulmonary effort is normal.     Breath sounds: Normal breath sounds.  Abdominal:     General: Abdomen is flat. Bowel sounds are normal. There is no distension.     Palpations: Abdomen is soft. There is no mass.     Tenderness: There is no guarding or rebound.     Hernia: No hernia is present.     Comments: Mildly tender in the epigastrium   Neurological:     Mental Status: He is alert.          Assessment & Plan:  Epigastric pain, likely gastritis. Try Omeprazole 40 mg daily.  Alysia Penna, MD

## 2021-08-20 ENCOUNTER — Encounter: Payer: Self-pay | Admitting: Endocrinology

## 2021-08-20 ENCOUNTER — Ambulatory Visit: Payer: BC Managed Care – PPO | Admitting: Endocrinology

## 2021-08-20 ENCOUNTER — Other Ambulatory Visit: Payer: Self-pay

## 2021-08-20 VITALS — BP 116/68 | HR 61 | Ht 71.0 in | Wt 218.0 lb

## 2021-08-20 DIAGNOSIS — E1022 Type 1 diabetes mellitus with diabetic chronic kidney disease: Secondary | ICD-10-CM

## 2021-08-20 DIAGNOSIS — N182 Chronic kidney disease, stage 2 (mild): Secondary | ICD-10-CM | POA: Diagnosis not present

## 2021-08-20 LAB — BASIC METABOLIC PANEL
BUN: 17 mg/dL (ref 6–23)
CO2: 29 mEq/L (ref 19–32)
Calcium: 9.3 mg/dL (ref 8.4–10.5)
Chloride: 105 mEq/L (ref 96–112)
Creatinine, Ser: 1.03 mg/dL (ref 0.40–1.50)
GFR: 77.75 mL/min (ref >60.00)
Glucose, Bld: 75 mg/dL (ref 70–99)
Potassium: 3.6 mEq/L (ref 3.5–5.1)
Sodium: 139 mEq/L (ref 135–145)

## 2021-08-20 LAB — CBC WITH DIFFERENTIAL/PLATELET
Basophils Absolute: 0 10*3/uL (ref 0.0–0.1)
Basophils Relative: 0.6 % (ref 0.0–3.0)
Eosinophils Absolute: 0.1 10*3/uL (ref 0.0–0.7)
Eosinophils Relative: 1.8 % (ref 0.0–5.0)
HCT: 37.4 % — ABNORMAL LOW (ref 39.0–52.0)
Hemoglobin: 12.3 g/dL — ABNORMAL LOW (ref 13.0–17.0)
Lymphocytes Relative: 38.2 % (ref 12.0–46.0)
Lymphs Abs: 1.9 10*3/uL (ref 0.7–4.0)
MCHC: 32.8 g/dL (ref 30.0–36.0)
MCV: 86.2 fl (ref 78.0–100.0)
Monocytes Absolute: 0.5 10*3/uL (ref 0.1–1.0)
Monocytes Relative: 10.1 % (ref 3.0–12.0)
Neutro Abs: 2.4 10*3/uL (ref 1.4–7.7)
Neutrophils Relative %: 49.3 % (ref 43.0–77.0)
Platelets: 220 10*3/uL (ref 150.0–400.0)
RBC: 4.34 Mil/uL (ref 4.22–5.81)
RDW: 16.3 % — ABNORMAL HIGH (ref 11.5–15.5)
WBC: 4.9 10*3/uL (ref 4.0–10.5)

## 2021-08-20 LAB — POCT GLYCOSYLATED HEMOGLOBIN (HGB A1C): Hemoglobin A1C: 9.5 % — AB (ref 4.0–5.6)

## 2021-08-20 MED ORDER — BASAGLAR KWIKPEN 100 UNIT/ML ~~LOC~~ SOPN: 35.0000 [IU] | PEN_INJECTOR | SUBCUTANEOUS | 3 refills | Status: DC

## 2021-08-20 NOTE — Progress Notes (Signed)
Subjective:    Patient ID: Kenneth [name], male    DOB: [birth year], 65 y.o.   MRN: [id number]  HPI Pt returns for f/u of diabetes mellitus:  DM type: 1 Dx'ed: [id number] Complications: CRI.  Therapy: insulin since dx.   DKA: twice: at the time of dx, and approx 2011.    Pancreatitis: never.   SDOH: He does not check cbg's; pt says DKA episodes were related to alcoholism; he no longer consumes EtOH. Other: in early 2015, he was changed to a qd insulin regimen, due to poor results with multiple daily injections; he works 2nd shift, setting up for events; he has seen CDE, to verify injection technique; fructosamine has confirmed A1c.   Interval history: Pt says he never misses the insulin.  pt states he feels well in general.   Past Medical History:  Diagnosis Date   Alcohol abuse    Angina    Chronic kidney disease    Diabetes mellitus    sees Dr. Renato Shin    Hyperlipidemia    Hypertension     Past Surgical History:  Procedure Laterality Date   COLONOSCOPY  05/06/2014   per Dr. Henrene Pastor, clear, repeat in 10 yrs    LAPAROSCOPIC APPENDECTOMY N/A 04/06/2017   Procedure: APPENDECTOMY LAPAROSCOPIC with primary repair of umbilical hernia;  Surgeon: Georganna Skeans, MD;  Location: [facility] OR;  Service: General;  Laterality: N/A;    Social History   Socioeconomic History   Marital status: Single    Spouse name: Not on file   Number of children: Not on file   Years of education: Not on file   Highest education level: Not on file  Occupational History   Not on file  Tobacco Use   Smoking status: Former    Packs/day: 0.50    Types: Cigarettes    Quit date: 01/30/2012    Years since quitting: 9.5   Smokeless tobacco: Never  Substance and Sexual Activity   Alcohol use: Yes    Alcohol/week: 0.0 standard drinks    Comment: rare   Drug use: No   Sexual activity: Not on file  Other Topics Concern   Not on file  Social History Narrative   Not on file   Social Determinants of Health    Financial Resource Strain: Not on file  Food Insecurity: Not on file  Transportation Needs: Not on file  Physical Activity: Not on file  Stress: Not on file  Social Connections: Not on file  Intimate Partner Violence: Not on file    Current Outpatient Medications on File Prior to Visit  Medication Sig Dispense Refill   acetaminophen (TYLENOL) 500 MG tablet Take 1,000 mg by mouth every 6 (six) hours as needed for mild pain.      ferrous gluconate (FERGON) 324 MG tablet Take 1 tablet (324 mg total) by mouth 2 (two) times daily with a meal. 180 tablet 3   glucose blood (ONETOUCH VERIO) test strip 1 each by Other route 2 (two) times daily. And lancets 2/day. 100 each 12   metroNIDAZOLE (METROGEL) 0.75 % gel Apply 1 application topically 2 (two) times daily. 45 g 12   naproxen sodium (ANAPROX) 220 MG tablet Take 440 mg by mouth 2 (two) times daily as needed (pain).     omeprazole (PRILOSEC) 40 MG capsule Take 1 capsule (40 mg total) by mouth daily. 30 capsule 3   simvastatin (ZOCOR) 20 MG tablet TAKE 1 TABLET BY MOUTH AT BEDTIME 90 tablet 1  No current facility-administered medications on file prior to visit.    Allergies  Allergen Reactions   Metformin And Related Nausea And Vomiting    Family History  Problem Relation Age of Onset   Diabetes Father    Hypertension Father    Alcohol abuse Father    Diabetes Sister    Breast cancer Mother    Colon cancer Neg Hx    Pancreatic cancer Neg Hx    Stomach cancer Neg Hx     BP 116/68   Pulse 61   Ht '5\' 11"'$  (1.803 m)   Wt 218 lb (98.9 kg)   SpO2 97%   BMI 30.40 kg/m    Review of Systems Denies CP, dizziness, LOC, bleeding.     Objective:   Physical Exam Pulses: dorsalis pedis intact bilat.   MSK: no deformity of the feet.  CV: 1+ bilat leg edema.   Skin:  no ulcer on the feet, but the skin is dry.  normal color and temp on the feet.   Neuro: sensation is intact to touch on the feet.   Ext: there is bilateral  onychomycosis of the toenails.    Lab Results  Component Value Date   HGBA1C 9.5 (A) 08/20/2021   Lab Results  Component Value Date   CREATININE 0.87 12/16/2020   BUN 11 12/16/2020   NA 136 12/16/2020   K 4.3 12/16/2020   CL 102 12/16/2020   CO2 28 12/16/2020      Assessment & Plan:  Type 1 DM: uncontrolled.    Patient Instructions  Please increase the insulin to 35 units each morning.   check your blood sugar twice a day.  vary the time of day when you check, between before the 3 meals, and at bedtime.  also check if you have symptoms of your blood sugar being too high or too low.  please keep a record of the readings and bring it to your next appointment here (or you can bring the meter itself).  You can write it on any piece of paper.  please call us sooner if your blood sugar goes below 70, or if you have a lot of readings over 200.   Please stop taking the losartan. Blood tests are requested for you today.  We'll let you know about the results.   Please have the blood pressure rechecked tomorrow.   Please come back for a follow-up appointment in 2 months.

## 2021-08-20 NOTE — Patient Instructions (Addendum)
Please increase the insulin to 35 units each morning.   check your blood sugar twice a day.  vary the time of day when you check, between before the 3 meals, and at bedtime.  also check if you have symptoms of your blood sugar being too high or too low.  please keep a record of the readings and bring it to your next appointment here (or you can bring the meter itself).  You can write it on any piece of paper.  please call us sooner if your blood sugar goes below 70, or if you have a lot of readings over 200.   Please stop taking the losartan. Blood tests are requested for you today.  We'll let you know about the results.   Please have the blood pressure rechecked tomorrow.   Please come back for a follow-up appointment in 2 months.

## 2021-09-26 ENCOUNTER — Other Ambulatory Visit: Payer: Self-pay | Admitting: Family Medicine

## 2021-10-12 ENCOUNTER — Other Ambulatory Visit: Payer: Self-pay | Admitting: Family Medicine

## 2021-10-12 ENCOUNTER — Telehealth: Payer: Self-pay | Admitting: Family Medicine

## 2021-10-12 NOTE — Telephone Encounter (Signed)
Pt is calling and needs a refill on ferrous gluconate 324 mg. Pt take 2 pills a day please send to   [facility] [place] Phone:  [phone]  Fax:  [phone]

## 2021-10-13 ENCOUNTER — Other Ambulatory Visit: Payer: Self-pay

## 2021-10-13 MED ORDER — FERROUS GLUCONATE 324 (38 FE) MG PO TABS: 324.0000 mg | ORAL_TABLET | Freq: Two times a day (BID) | ORAL | 0 refills | Status: DC

## 2021-10-13 NOTE — Telephone Encounter (Signed)
Spoke with patient aware that refill for ferrous gluconate 324mg  has been sent to [facility].

## 2021-10-20 ENCOUNTER — Other Ambulatory Visit: Payer: Self-pay

## 2021-10-20 ENCOUNTER — Ambulatory Visit (INDEPENDENT_AMBULATORY_CARE_PROVIDER_SITE_OTHER): Payer: BC Managed Care – PPO | Admitting: Endocrinology

## 2021-10-20 VITALS — BP 126/80 | HR 65 | Ht 71.0 in | Wt 217.6 lb

## 2021-10-20 DIAGNOSIS — N182 Chronic kidney disease, stage 2 (mild): Secondary | ICD-10-CM

## 2021-10-20 DIAGNOSIS — E1022 Type 1 diabetes mellitus with diabetic chronic kidney disease: Secondary | ICD-10-CM | POA: Diagnosis not present

## 2021-10-20 LAB — POCT GLYCOSYLATED HEMOGLOBIN (HGB A1C): Hemoglobin A1C: 10.5 % — AB (ref 4.0–5.6)

## 2021-10-20 MED ORDER — BASAGLAR KWIKPEN 100 UNIT/ML ~~LOC~~ SOPN: 45.0000 [IU] | PEN_INJECTOR | SUBCUTANEOUS | 3 refills | Status: DC

## 2021-10-20 NOTE — Progress Notes (Signed)
Subjective:    Patient ID: Kenneth [name], male    DOB: 1956-06-14, 65 y.o.   MRN: [id number]  HPI Pt returns for f/u of diabetes mellitus:  DM type: 1 Dx'ed: 0973 Complications: CRI.  Therapy: insulin since dx.   DKA: twice: at the time of dx, and approx 2011.    Pancreatitis: never.   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   Other: in early 2015, he was changed to a qd insulin regimen, due to poor results with multiple daily injections; he has seen CDE, to verify injection technique; fructosamine has confirmed A1c.   Interval history: Pt says he never misses the insulin.  pt states he feels well in general.  He says Nancee Liter has $0 copay.   Past Medical History:  Diagnosis Date   Alcohol abuse    Angina    Chronic kidney disease    Diabetes mellitus    sees Dr. Renato Shin    Hyperlipidemia    Hypertension     Past Surgical History:  Procedure Laterality Date   COLONOSCOPY  05/06/2014   per Dr. Henrene Pastor, clear, repeat in 10 yrs    LAPAROSCOPIC APPENDECTOMY N/A 04/06/2017   Procedure: APPENDECTOMY LAPAROSCOPIC with primary repair of umbilical hernia;  Surgeon: Georganna Skeans, MD;  Location: [facility] OR;  Service: General;  Laterality: N/A;    Social History   Socioeconomic History   Marital status: Single    Spouse name: Not on file   Number of children: Not on file   Years of education: Not on file   Highest education level: Not on file  Occupational History   Not on file  Tobacco Use   Smoking status: Former    Packs/day: 0.50    Types: Cigarettes    Quit date: 01/30/2012    Years since quitting: 9.7   Smokeless tobacco: Never  Substance and Sexual Activity   Alcohol use: Yes    Alcohol/week: 0.0 standard drinks    Comment: rare   Drug use: No   Sexual activity: Not on file  Other Topics Concern   Not on file   Social History Narrative   Not on file   Social Determinants of Health   Financial Resource Strain: Not on file  Food Insecurity: Not on file  Transportation Needs: Not on file  Physical Activity: Not on file  Stress: Not on file  Social Connections: Not on file  Intimate Partner Violence: Not on file    Current Outpatient Medications on File Prior to Visit  Medication Sig Dispense Refill   acetaminophen (TYLENOL) 500 MG tablet Take 1,000 mg by mouth every 6 (six) hours as needed for mild pain.      ferrous gluconate (FERGON) 324 MG tablet Take 1 tablet (324 mg total) by mouth 2 (two) times daily with a meal. 180 tablet 0   glucose blood (ONETOUCH VERIO) test strip 1 each by Other route 2 (two) times daily. And lancets 2/day. 100 each 12   metroNIDAZOLE (METROGEL) 0.75 % gel Apply 1 application topically 2 (two) times daily. 45 g 12   naproxen sodium (ANAPROX) 220 MG tablet Take 440 mg by mouth 2 (two) times daily as needed (pain).     omeprazole (PRILOSEC) 40 MG capsule Take 1 capsule by mouth once daily 30 capsule 0   simvastatin (  ZOCOR) 20 MG tablet TAKE 1 TABLET BY MOUTH AT BEDTIME 90 tablet 1   No current facility-administered medications on file prior to visit.    Allergies  Allergen Reactions   Metformin And Related Nausea And Vomiting    Family History  Problem Relation Age of Onset   Diabetes Father    Hypertension Father    Alcohol abuse Father    Diabetes Sister    Breast cancer Mother    Colon cancer Neg Hx    Pancreatic cancer Neg Hx    Stomach cancer Neg Hx     BP 126/80 (BP Location: Right Arm, Patient Position: Sitting, Cuff Size: Normal)   Pulse 65   Ht 5\' 11"  (1.803 m)   Wt 217 lb 9.6 oz (98.7 kg)   SpO2 96%   BMI 30.35 kg/m    Review of Systems He fatherdenies hypoglycemia.      Objective:   Physical Exam    Lab Results  Component Value Date   HGBA1C 10.5 (A) 10/20/2021      Assessment & Plan:  Type 1 DM: uncontrolled  Patient  Instructions  Please increase the insulin to 45 units each morning.   check your blood sugar twice a day.  vary the time of day when you check, between before the 3 meals, and at bedtime.  also check if you have symptoms of your blood sugar being too high or too low.  please keep a record of the readings and bring it to your next appointment here (or you can bring the meter itself).  You can write it on any piece of paper.  please call us sooner if your blood sugar goes below 70, or if you have a lot of readings over 200.   Blood tests are requested for you today.  We'll let you know about the results.   We are placing a continuous glucose monitor sensor today.   Please come back for a follow-up appointment in 2 weeks.

## 2021-10-20 NOTE — Patient Instructions (Addendum)
Please increase the insulin to 45 units each morning.   check your blood sugar twice a day.  vary the time of day when you check, between before the 3 meals, and at bedtime.  also check if you have symptoms of your blood sugar being too high or too low.  please keep a record of the readings and bring it to your next appointment here (or you can bring the meter itself).  You can write it on any piece of paper.  please call us sooner if your blood sugar goes below 70, or if you have a lot of readings over 200.   Blood tests are requested for you today.  We'll let you know about the results.   We are placing a continuous glucose monitor sensor today.   Please come back for a follow-up appointment in 2 weeks.

## 2021-11-03 ENCOUNTER — Ambulatory Visit (INDEPENDENT_AMBULATORY_CARE_PROVIDER_SITE_OTHER): Payer: BC Managed Care – PPO | Admitting: Endocrinology

## 2021-11-03 ENCOUNTER — Other Ambulatory Visit: Payer: Self-pay

## 2021-11-03 VITALS — BP 140/60 | HR 79 | Ht 71.0 in | Wt 221.6 lb

## 2021-11-03 DIAGNOSIS — N182 Chronic kidney disease, stage 2 (mild): Secondary | ICD-10-CM | POA: Diagnosis not present

## 2021-11-03 DIAGNOSIS — E1022 Type 1 diabetes mellitus with diabetic chronic kidney disease: Secondary | ICD-10-CM | POA: Diagnosis not present

## 2021-11-03 NOTE — Patient Instructions (Addendum)
Please continue the same basaglar.   check your blood sugar twice a day.  vary the time of day when you check, between before the 3 meals, and at bedtime.  also check if you have symptoms of your blood sugar being too high or too low.  please keep a record of the readings and bring it to your next appointment here (or you can bring the meter itself).  You can write it on any piece of paper.  please call us sooner if your blood sugar goes below 70, or if you have a lot of readings over 200.     Please bring [place] the continuous glucose monitor sensor tomorrow.  Blood tests are requested for you today.  We'll let you know about the results.    Please come back for a follow-up appointment in 2 months.

## 2021-11-03 NOTE — Progress Notes (Signed)
Subjective:    Patient ID: Kenneth [name], male    DOB: 07/28/1956, 65 y.o.   MRN: [id number]  HPI Pt returns for f/u of diabetes mellitus:  DM type: 1 Dx'ed: 2440 Complications: CRI.  Therapy: insulin since dx.   DKA: twice: at the time of dx, and approx 2011.   Severe hypoglycemia: never  Pancreatitis: never.   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   Other: in early 2015, he was changed to a qd insulin regimen, due to poor results with multiple daily injections; he has seen CDE, to verify injection technique; fructosamine has confirmed A1c.   Interval history: pt states he feels well in general.  He says Kenneth [name] has $0 copay. He says insulin injections are very painful, but he never misses.  He does not recall what cbg's are.   Past Medical History:  Diagnosis Date   Alcohol abuse    Angina    Chronic kidney disease    Diabetes mellitus    sees Dr. Renato [name]    Hyperlipidemia    Hypertension     Past Surgical History:  Procedure Laterality Date   COLONOSCOPY  05/06/2014   per Dr. Henrene [name], clear, repeat in 10 yrs    LAPAROSCOPIC APPENDECTOMY N/A 04/06/2017   Procedure: APPENDECTOMY LAPAROSCOPIC with primary repair of umbilical hernia;  Surgeon: Kenneth Skeans, MD;  Location: [facility] OR;  Service: General;  Laterality: N/A;    Social History   Socioeconomic History   Marital status: Single    Spouse name: Not on file   Number of children: Not on file   Years of education: Not on file   Highest education level: Not on file  Occupational History   Not on file  Tobacco Use   Smoking status: Former    Packs/day: 0.50    Types: Cigarettes    Quit date: 01/30/2012    Years since quitting: 9.7   Smokeless tobacco: Never  Substance and Sexual Activity   Alcohol use: Yes    Alcohol/week: 0.0 standard drinks    Comment:  rare   Drug use: No   Sexual activity: Not on file  Other Topics Concern   Not on file  Social History Narrative   Not on file   Social Determinants of Health   Financial Resource Strain: Not on file  Food Insecurity: Not on file  Transportation Needs: Not on file  Physical Activity: Not on file  Stress: Not on file  Social Connections: Not on file  Intimate Partner Violence: Not on file    Current Outpatient Medications on File Prior to Visit  Medication Sig Dispense Refill   acetaminophen (TYLENOL) 500 MG tablet Take 1,000 mg by mouth every 6 (six) hours as needed for mild pain.      ferrous gluconate (FERGON) 324 MG tablet Take 1 tablet (324 mg total) by mouth 2 (two) times daily with a meal. 180 tablet 0   glucose blood (ONETOUCH VERIO) test strip 1 each by Other route 2 (two) times daily. And lancets 2/day. 100 each 12   Insulin Glargine (BASAGLAR KWIKPEN) 100 UNIT/ML Inject 45 Units into the skin every morning. And pen needles 1/day. 45 mL 3   metroNIDAZOLE (METROGEL) 0.75 % gel Apply 1 application topically 2 (two) times daily. 45 g 12   naproxen sodium (ANAPROX)  220 MG tablet Take 440 mg by mouth 2 (two) times daily as needed (pain).     omeprazole (PRILOSEC) 40 MG capsule Take 1 capsule by mouth once daily 30 capsule 0   simvastatin (ZOCOR) 20 MG tablet TAKE 1 TABLET BY MOUTH AT BEDTIME 90 tablet 1   No current facility-administered medications on file prior to visit.    Allergies  Allergen Reactions   Metformin And Related Nausea And Vomiting    Family History  Problem Relation Age of Onset   Diabetes Father    Hypertension Father    Alcohol abuse Father    Diabetes Sister    Breast cancer Mother    Colon cancer Neg Hx    Pancreatic cancer Neg Hx    Stomach cancer Neg Hx     BP 140/60 (BP Location: Right Arm, Patient Position: Sitting, Cuff Size: Large)   Pulse 79   Ht 5\' 11"  (1.803 m)   Wt 221 lb 9.6 oz (100.5 kg)   SpO2 98%   BMI 30.91 kg/m     Review of Systems He denies hypoglycemia.      Objective:   Physical Exam   Lab Results  Component Value Date   HGBA1C 10.5 (A) 10/20/2021      Assessment & Plan:  Type 1 DM: uncontrolled.  Please continue the same medications, pending CGM results  Patient Instructions  Please continue the same basaglar.   check your blood sugar twice a day.  vary the time of day when you check, between before the 3 meals, and at bedtime.  also check if you have symptoms of your blood sugar being too high or too low.  please keep a record of the readings and bring it to your next appointment here (or you can bring the meter itself).  You can write it on any piece of paper.  please call us sooner if your blood sugar goes below 70, or if you have a lot of readings over 200.     Please bring [place] the continuous glucose monitor sensor tomorrow.  Blood tests are requested for you today.  We'll let you know about the results.    Please come back for a follow-up appointment in 2 months.

## 2021-11-06 LAB — FRUCTOSAMINE: Fructosamine: 391 umol/L — ABNORMAL HIGH (ref 205–285)

## 2021-11-09 ENCOUNTER — Other Ambulatory Visit: Payer: Self-pay | Admitting: Family Medicine

## 2021-11-24 ENCOUNTER — Other Ambulatory Visit: Payer: Self-pay | Admitting: Family Medicine

## 2021-11-30 ENCOUNTER — Encounter: Payer: Self-pay | Admitting: Family Medicine

## 2021-11-30 ENCOUNTER — Ambulatory Visit (INDEPENDENT_AMBULATORY_CARE_PROVIDER_SITE_OTHER): Payer: BC Managed Care – PPO | Admitting: Family Medicine

## 2021-11-30 VITALS — BP 124/70 | HR 60 | Temp 97.9°F | Ht 71.0 in | Wt 222.1 lb

## 2021-11-30 DIAGNOSIS — Z0000 Encounter for general adult medical examination without abnormal findings: Secondary | ICD-10-CM

## 2021-11-30 DIAGNOSIS — Z125 Encounter for screening for malignant neoplasm of prostate: Secondary | ICD-10-CM | POA: Diagnosis not present

## 2021-11-30 DIAGNOSIS — Z23 Encounter for immunization: Secondary | ICD-10-CM

## 2021-11-30 LAB — BASIC METABOLIC PANEL
BUN: 13 mg/dL (ref 6–23)
CO2: 30 mEq/L (ref 19–32)
Calcium: 9.4 mg/dL (ref 8.4–10.5)
Chloride: 102 mEq/L (ref 96–112)
Creatinine, Ser: 0.99 mg/dL (ref 0.40–1.50)
GFR: 81.37 mL/min (ref >60.00)
Glucose, Bld: 70 mg/dL (ref 70–99)
Potassium: 4 mEq/L (ref 3.5–5.1)
Sodium: 139 mEq/L (ref 135–145)

## 2021-11-30 LAB — CBC WITH DIFFERENTIAL/PLATELET
Basophils Absolute: 0 10*3/uL (ref 0.0–0.1)
Basophils Relative: 0.4 % (ref 0.0–3.0)
Eosinophils Absolute: 0.1 10*3/uL (ref 0.0–0.7)
Eosinophils Relative: 0.8 % (ref 0.0–5.0)
HCT: 37.5 % — ABNORMAL LOW (ref 39.0–52.0)
Hemoglobin: 12.9 g/dL — ABNORMAL LOW (ref 13.0–17.0)
Lymphocytes Relative: 27.9 % (ref 12.0–46.0)
Lymphs Abs: 1.7 10*3/uL (ref 0.7–4.0)
MCHC: 34.4 g/dL (ref 30.0–36.0)
MCV: 92.4 fl (ref 78.0–100.0)
Monocytes Absolute: 0.5 10*3/uL (ref 0.1–1.0)
Monocytes Relative: 7.8 % (ref 3.0–12.0)
Neutro Abs: 3.9 10*3/uL (ref 1.4–7.7)
Neutrophils Relative %: 63.1 % (ref 43.0–77.0)
Platelets: 220 10*3/uL (ref 150.0–400.0)
RBC: 4.06 Mil/uL — ABNORMAL LOW (ref 4.22–5.81)
RDW: 13.2 % (ref 11.5–15.5)
WBC: 6.1 10*3/uL (ref 4.0–10.5)

## 2021-11-30 LAB — LIPID PANEL
Cholesterol: 120 mg/dL (ref 0–200)
HDL: 46 mg/dL (ref >39.00)
LDL Cholesterol: 64 mg/dL (ref 0–99)
NonHDL: 74.44
Total CHOL/HDL Ratio: 3
Triglycerides: 51 mg/dL (ref 0.0–149.0)
VLDL: 10.2 mg/dL (ref 0.0–40.0)

## 2021-11-30 LAB — PSA: PSA: 3.25 ng/mL (ref 0.10–4.00)

## 2021-11-30 LAB — HEPATIC FUNCTION PANEL
ALT: 15 U/L (ref 0–53)
AST: 10 U/L (ref 0–37)
Albumin: 4.2 g/dL (ref 3.5–5.2)
Alkaline Phosphatase: 86 U/L (ref 39–117)
Bilirubin, Direct: 0.1 mg/dL (ref 0.0–0.3)
Total Bilirubin: 0.5 mg/dL (ref 0.2–1.2)
Total Protein: 7.3 g/dL (ref 6.0–8.3)

## 2021-11-30 LAB — TSH: TSH: 1.94 u[IU]/mL (ref 0.35–5.50)

## 2021-11-30 MED ORDER — OMEPRAZOLE 40 MG PO CPDR: 40.0000 mg | DELAYED_RELEASE_CAPSULE | Freq: Every day | ORAL | 3 refills | Status: DC

## 2021-11-30 NOTE — Progress Notes (Signed)
   Subjective:    Patient ID: Kenneth [name], male    DOB: 1956/09/25, 65 y.o.   MRN: [id number]  HPI Here for a well exam. He feels fine. He recently saw Dr. Loanne Drilling and his A1c is up to 10.5. they are making some adjustments.    Review of Systems  Constitutional: Negative.   HENT: Negative.    Eyes: Negative.   Respiratory: Negative.    Cardiovascular: Negative.   Gastrointestinal: Negative.   Genitourinary: Negative.   Musculoskeletal: Negative.   Skin: Negative.   Neurological: Negative.   Psychiatric/Behavioral: Negative.        Objective:   Physical Exam Constitutional:      General: He is not in acute distress.    Appearance: Normal appearance. He is well-developed. He is not diaphoretic.  HENT:     Head: Normocephalic and atraumatic.     Right Ear: External ear normal.     Left Ear: External ear normal.     Nose: Nose normal.     Mouth/Throat:     Pharynx: No oropharyngeal exudate.  Eyes:     General: No scleral icterus.       Right eye: No discharge.        Left eye: No discharge.     Conjunctiva/sclera: Conjunctivae normal.     Pupils: Pupils are equal, round, and reactive to light.  Neck:     Thyroid: No thyromegaly.     Vascular: No JVD.     Trachea: No tracheal deviation.  Cardiovascular:     Rate and Rhythm: Normal rate and regular rhythm.     Heart sounds: Normal heart sounds. No murmur heard.   No friction rub. No gallop.  Pulmonary:     Effort: Pulmonary effort is normal. No respiratory distress.     Breath sounds: Normal breath sounds. No wheezing or rales.  Chest:     Chest wall: No tenderness.  Abdominal:     General: Bowel sounds are normal. There is no distension.     Palpations: Abdomen is soft. There is no mass.     Tenderness: There is no abdominal tenderness. There is no guarding or rebound.  Genitourinary:    Penis: Normal. No tenderness.      Testes: Normal.     Prostate: Normal.     Rectum: Normal. Guaiac result negative.   Musculoskeletal:        General: No tenderness. Normal range of motion.     Cervical back: Neck supple.  Lymphadenopathy:     Cervical: No cervical adenopathy.  Skin:    General: Skin is warm and dry.     Coloration: Skin is not pale.     Findings: No erythema or rash.  Neurological:     Mental Status: He is alert and oriented to person, place, and time.     Cranial Nerves: No cranial nerve deficit.     Motor: No abnormal muscle tone.     Coordination: Coordination normal.     Deep Tendon Reflexes: Reflexes are normal and symmetric. Reflexes normal.  Psychiatric:        Behavior: Behavior normal.        Thought Content: Thought content normal.        Judgment: Judgment normal.          Assessment & Plan:  Well exam. We discussed diet and exercise. Get fasting labs. Alysia Penna, MD

## 2022-01-05 ENCOUNTER — Other Ambulatory Visit: Payer: Self-pay

## 2022-01-05 ENCOUNTER — Ambulatory Visit: Payer: BC Managed Care – PPO | Admitting: Endocrinology

## 2022-01-05 VITALS — BP 124/64 | HR 73 | Ht 71.0 in | Wt 219.8 lb

## 2022-01-05 DIAGNOSIS — N189 Chronic kidney disease, unspecified: Secondary | ICD-10-CM

## 2022-01-05 DIAGNOSIS — E1022 Type 1 diabetes mellitus with diabetic chronic kidney disease: Secondary | ICD-10-CM | POA: Diagnosis not present

## 2022-01-05 LAB — POCT GLYCOSYLATED HEMOGLOBIN (HGB A1C): Hemoglobin A1C: 11.2 % — AB (ref 4.0–5.6)

## 2022-01-05 MED ORDER — BASAGLAR KWIKPEN 100 UNIT/ML ~~LOC~~ SOPN: 50.0000 [IU] | PEN_INJECTOR | SUBCUTANEOUS | 3 refills | Status: DC

## 2022-01-05 NOTE — Patient Instructions (Addendum)
Please increase the basaglar to 50 units per day.   check your blood sugar twice a day.  vary the time of day when you check, between before the 3 meals, and at bedtime.  also check if you have symptoms of your blood sugar being too high or too low.  please keep a record of the readings and bring it to your next appointment here (or you can bring the meter itself).  You can write it on any piece of paper.  please call us sooner if your blood sugar goes below 70, or if you have a lot of readings over 200.      Please come back for a follow-up appointment in 2 months.

## 2022-01-05 NOTE — Progress Notes (Signed)
Subjective:    Patient ID: Kenneth [name], male    DOB: Feb 16, 1956, 66 y.o.   MRN: [id number]  HPI Pt returns for f/u of diabetes mellitus:  DM type: 1 Dx'ed: 1191 Complications: CRI.  Therapy: insulin since dx.   DKA: twice: at the time of dx, and approx 2011.   Severe hypoglycemia: never  Pancreatitis: never.   SDOH: He does not check cbg's; pt says DKA episodes were related to alcoholism; he no longer consumes EtOH; he works 2 jobs, 4AM-10AM, and 2PM-10PM.   Other: in early 2015, he was changed to a qd insulin regimen, due to poor results with multiple daily injections; he has seen CDE, to verify injection technique; fructosamine has confirmed A1c; we applied PCGM, but it fell off Interval history: pt states he feels well in general.  Pt says he never misses insulin.   Past Medical History:  Diagnosis Date   Alcohol abuse    Angina    Chronic kidney disease    Diabetes mellitus    sees Dr. Renato Shin    Hyperlipidemia    Hypertension     Past Surgical History:  Procedure Laterality Date   COLONOSCOPY  05/06/2014   per Dr. Henrene Pastor, clear, repeat in 10 yrs    LAPAROSCOPIC APPENDECTOMY N/A 04/06/2017   Procedure: APPENDECTOMY LAPAROSCOPIC with primary repair of umbilical hernia;  Surgeon: Georganna Skeans, MD;  Location: [facility] OR;  Service: General;  Laterality: N/A;    Social History   Socioeconomic History   Marital status: Single    Spouse name: Not on file   Number of children: Not on file   Years of education: Not on file   Highest education level: Not on file  Occupational History   Not on file  Tobacco Use   Smoking status: Former    Packs/day: 0.50    Types: Cigarettes    Quit date: 01/30/2012    Years since quitting: 9.9   Smokeless tobacco: Never  Substance and Sexual Activity   Alcohol use: Yes    Alcohol/week: 0.0 standard drinks    Comment: rare   Drug use: No   Sexual activity: Not on file  Other Topics Concern   Not on file  Social History  Narrative   Not on file   Social Determinants of Health   Financial Resource Strain: Not on file  Food Insecurity: Not on file  Transportation Needs: Not on file  Physical Activity: Not on file  Stress: Not on file  Social Connections: Not on file  Intimate Partner Violence: Not on file    Current Outpatient Medications on File Prior to Visit  Medication Sig Dispense Refill   acetaminophen (TYLENOL) 500 MG tablet Take 1,000 mg by mouth every 6 (six) hours as needed for mild pain.      ferrous gluconate (FERGON) 324 MG tablet Take 1 tablet (324 mg total) by mouth 2 (two) times daily with a meal. 180 tablet 0   glucose blood (ONETOUCH VERIO) test strip 1 each by Other route 2 (two) times daily. And lancets 2/day. 100 each 12   metroNIDAZOLE (METROGEL) 0.75 % gel Apply 1 application topically 2 (two) times daily. 45 g 12   naproxen sodium (ANAPROX) 220 MG tablet Take 440 mg by mouth 2 (two) times daily as needed (pain).     omeprazole (PRILOSEC) 40 MG capsule Take 1 capsule (40 mg total) by mouth daily. 90 capsule 3   simvastatin (ZOCOR) 20 MG tablet TAKE 1  TABLET BY MOUTH AT BEDTIME 90 tablet 0   No current facility-administered medications on file prior to visit.    Allergies  Allergen Reactions   Metformin And Related Nausea And Vomiting    Family History  Problem Relation Age of Onset   Diabetes Father    Hypertension Father    Alcohol abuse Father    Diabetes Sister    Breast cancer Mother    Colon cancer Neg Hx    Pancreatic cancer Neg Hx    Stomach cancer Neg Hx     BP 124/64    Pulse 73    Ht 5\' 11"  (1.803 m)    Wt 219 lb 12.8 oz (99.7 kg)    SpO2 97%    BMI 30.66 kg/m    Review of Systems     Objective:   Physical Exam    A1c=11.2%    Assessment & Plan:  Type 1 DM: uncontrolled  Patient Instructions  Please increase the basaglar to 50 units per day.   check your blood sugar twice a day.  vary the time of day when you check, between before the 3  meals, and at bedtime.  also check if you have symptoms of your blood sugar being too high or too low.  please keep a record of the readings and bring it to your next appointment here (or you can bring the meter itself).  You can write it on any piece of paper.  please call us sooner if your blood sugar goes below 70, or if you have a lot of readings over 200.      Please come back for a follow-up appointment in 2 months.

## 2022-01-16 ENCOUNTER — Other Ambulatory Visit: Payer: Self-pay

## 2022-01-16 ENCOUNTER — Ambulatory Visit (HOSPITAL_COMMUNITY)
Admission: EM | Admit: 2022-01-16 | Discharge: 2022-01-16 | Disposition: A | Discharge status: Home / Self Care | Payer: BC Managed Care – PPO | Attending: Student | Admitting: Student

## 2022-01-16 ENCOUNTER — Encounter (HOSPITAL_COMMUNITY): Payer: Self-pay | Admitting: Emergency Medicine

## 2022-01-16 DIAGNOSIS — S46812A Strain of other muscles, fascia and tendons at shoulder and upper arm level, left arm, initial encounter: Secondary | ICD-10-CM

## 2022-01-16 MED ORDER — TIZANIDINE HCL 2 MG PO TABS: 2.0000 mg | ORAL_TABLET | Freq: Three times a day (TID) | ORAL | 0 refills | Status: DC | PRN
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
## 2022-01-16 NOTE — Discharge Instructions (Addendum)
-  Start the muscle relaxer-Zanaflex (tizanidine), up to 3 times daily for muscle spasms and pain.  This can make you drowsy, so take at bedtime or when you do not need to drive or operate machinery.

## 2022-02-07 ENCOUNTER — Other Ambulatory Visit: Payer: Self-pay | Admitting: Family Medicine

## 2022-03-09 ENCOUNTER — Ambulatory Visit (INDEPENDENT_AMBULATORY_CARE_PROVIDER_SITE_OTHER): Payer: BC Managed Care – PPO | Admitting: Endocrinology

## 2022-03-09 ENCOUNTER — Encounter: Payer: Self-pay | Admitting: Endocrinology

## 2022-03-09 ENCOUNTER — Other Ambulatory Visit: Payer: Self-pay

## 2022-03-09 VITALS — BP 124/72 | HR 72 | Ht 71.0 in | Wt 220.8 lb

## 2022-03-09 DIAGNOSIS — N189 Chronic kidney disease, unspecified: Secondary | ICD-10-CM

## 2022-03-09 DIAGNOSIS — E1065 Type 1 diabetes mellitus with hyperglycemia: Secondary | ICD-10-CM | POA: Diagnosis not present

## 2022-03-09 DIAGNOSIS — E1022 Type 1 diabetes mellitus with diabetic chronic kidney disease: Secondary | ICD-10-CM

## 2022-03-09 LAB — POCT GLYCOSYLATED HEMOGLOBIN (HGB A1C): Hemoglobin A1C: 11.4 % — AB (ref 4.0–5.6)

## 2022-03-09 MED ORDER — BASAGLAR KWIKPEN 100 UNIT/ML ~~LOC~~ SOPN: 60.0000 [IU] | PEN_INJECTOR | SUBCUTANEOUS | 3 refills | Status: DC

## 2022-03-09 NOTE — Patient Instructions (Addendum)
Please increase the basaglar to 60 units per day.   ?check your blood sugar twice a day.  vary the time of day when you check, between before the 3 meals, and at bedtime.  also check if you have symptoms of your blood sugar being too high or too low.  please keep a record of the readings and bring it to your next appointment here (or you can bring the meter itself).  You can write it on any piece of paper.  please call us sooner if your blood sugar goes below 70, or if you have a lot of readings over 200.   ?Please come back for a follow-up appointment in 3 months.   ? ?

## 2022-03-09 NOTE — Progress Notes (Signed)
? ?  Subjective:  ? ? Patient ID: Kenneth [name], male    DOB: 09-27-1956, 66 y.o.   MRN: [id number] ? ?HPI ?Pt returns for f/u of diabetes mellitus:  ?DM type: 1 ?Dx'ed: 2008 ?Complications: CRI.  ?Therapy: insulin since dx.   ?DKA: twice: at the time of dx, and approx 2011.   ?Severe hypoglycemia: never  ?Pancreatitis: never.   ?SDOH: He does not check cbg's; pt says DKA episodes were related to alcoholism; he no longer consumes EtOH; he works 2 jobs, 4AM-10AM, and 2PM-10PM.   ?Other: in early 2015, he was changed to a qd insulin regimen, due to poor results with multiple daily injections; he has seen CDE, to verify injection technique; fructosamine has confirmed A1c; we applied PCGM, but it fell off ?Interval history: pt states he feels well in general.  Pt says he never misses insulin.   ? ? ?Review of Systems ?Denies N/V ?   ?Objective:  ? Physical Exam ? ? ? ?A1c=11.4% ?   ?Assessment & Plan:  ?Type 1 DM: uncontrolled, usually due to Noncompliance with insulin.   ? ?

## 2022-06-03 ENCOUNTER — Other Ambulatory Visit (INDEPENDENT_AMBULATORY_CARE_PROVIDER_SITE_OTHER): Payer: BC Managed Care – PPO

## 2022-06-03 ENCOUNTER — Other Ambulatory Visit: Payer: Self-pay | Admitting: Endocrinology

## 2022-06-03 DIAGNOSIS — E1022 Type 1 diabetes mellitus with diabetic chronic kidney disease: Secondary | ICD-10-CM

## 2022-06-03 DIAGNOSIS — N182 Chronic kidney disease, stage 2 (mild): Secondary | ICD-10-CM

## 2022-06-03 LAB — BASIC METABOLIC PANEL
BUN: 16 mg/dL (ref 6–23)
CO2: 27 mEq/L (ref 19–32)
Calcium: 9.6 mg/dL (ref 8.4–10.5)
Chloride: 103 mEq/L (ref 96–112)
Creatinine, Ser: 0.97 mg/dL (ref 0.40–1.50)
GFR: 83.1 mL/min (ref >60.00)
Glucose, Bld: 59 mg/dL — ABNORMAL LOW (ref 70–99)
Potassium: 3.6 mEq/L (ref 3.5–5.1)
Sodium: 139 mEq/L (ref 135–145)

## 2022-06-03 LAB — MICROALBUMIN / CREATININE URINE RATIO
Creatinine,U: 35.9 mg/dL
Microalb Creat Ratio: 7.3 mg/g (ref 0.0–30.0)
Microalb, Ur: 2.6 mg/dL — ABNORMAL HIGH (ref 0.0–1.9)

## 2022-06-03 LAB — HEMOGLOBIN A1C: Hgb A1c MFr Bld: 8.2 % — ABNORMAL HIGH (ref 4.6–6.5)

## 2022-06-09 ENCOUNTER — Encounter: Payer: Self-pay | Admitting: Endocrinology

## 2022-06-09 ENCOUNTER — Ambulatory Visit (INDEPENDENT_AMBULATORY_CARE_PROVIDER_SITE_OTHER): Payer: BC Managed Care – PPO | Admitting: Endocrinology

## 2022-06-09 VITALS — BP 104/70 | HR 73 | Ht 71.0 in | Wt 221.2 lb

## 2022-06-09 DIAGNOSIS — Z6830 Body mass index (BMI) 30.0-30.9, adult: Secondary | ICD-10-CM | POA: Diagnosis not present

## 2022-06-09 DIAGNOSIS — E1065 Type 1 diabetes mellitus with hyperglycemia: Secondary | ICD-10-CM | POA: Diagnosis not present

## 2022-06-09 DIAGNOSIS — E7800 Pure hypercholesterolemia, unspecified: Secondary | ICD-10-CM | POA: Diagnosis not present

## 2022-06-09 MED ORDER — DEXCOM G7 SENSOR MISC: 1.0000 | 3 refills | Status: DC

## 2022-06-09 MED ORDER — NOVOLOG FLEXPEN 100 UNIT/ML ~~LOC~~ SOPN: PEN_INJECTOR | SUBCUTANEOUS | 1 refills | Status: DC

## 2022-06-09 NOTE — Patient Instructions (Signed)
NOVOLOG: This is a mealtime insulin to cover after meals spikes This is taken right before mealtimes to target the blood sugar about 2 hours later to be under 180  Start with 10 units before your lunch meal and if your sugar is still more than 180 a couple of hours later you may need 12-14 otherwise continue the same dose If your sugars are below 100 within 4 hours of your shot you will need to reduce insulin  Also take 6 units at suppertime if eating a big meal and 10 units if eating a large meal in the morning  BASAGLAR will need to be divided to 20 units in the morning and 40 units at 8 PM  Make sure you have some protein with every meal

## 2022-06-09 NOTE — Progress Notes (Signed)
Patient ID: Kenneth [name], male   DOB: [birth year], 66 y.o.   MRN: [id number]            Reason for Appointment: Type II Diabetes follow-up   History of Present Illness   Diagnosis date: 2008  Previous history:  Apparently initially diagnosed with diabetic ketoacidosis Again had ketoacidosis in 2011 Although he may have been on basal bolus insulin regimen in the past he has been only on Lantus since about 2015  Oral hypoglycemic drugs previously given: Metformin Insulin was started soon after diagnosis  A1c range in the last few years is: 4.9-14  Recent history:     Non-insulin hypoglycemic drugs: None     Insulin regimen: BASAGLAR 60   units in the morning only       Side effects from medications: GI side effects from metformin  Current self management, blood sugar patterns and problems identified:  A1c is 8.2 compared to 11.4 previously He checks his blood sugar once or twice a day but did not bring his monitor for download Although he says he does not have hypoglycemia his lab glucose was 59 and he had no symptoms However had not eaten that morning but had taken his Lantus insulin as usual about 4 hours before Usually not checking readings after meals He had tried the Ahtanum sensor at 1 time but it apparently fell off after about a week and he has not tried it again He likes to do his insulin injections in his inner thigh but now he thinks that some of those areas are getting fatty deposits and blood sugars do not run evenly sometimes He works a second job in the evenings and usually is home only by 11 PM His main meal is usually at lunch and relatively small meal at dinner; however on his days off such as Sundays he will eat a large breakfast also with more carbohydrates He has tried to improve his diet after seeing the dietitian, previously having sweetened drinks and more snacks or ice cream  Exercise: He is 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 and  less in the afternoons  Diet management: Eating relatively small breakfast, mostly a biscuit      Monitors blood glucose: Once a day.    Glucometer: One Touch.           Blood Glucose readings not available, by recall  PRE-MEAL Fasting Lunch Dinner Bedtime Overall  Glucose range: About 140  About 160    Mean/median:        POST-MEAL PC Breakfast PC Lunch PC Dinner  Glucose range:   ?  Mean/median:        Hypoglycemia:  none reported                        Dietician visit: Most recent: 2020 Weight control:  Wt Readings from Last 3 Encounters:  06/09/22 221 lb 3.2 oz (100.3 kg)  03/09/22 220 lb 12.8 oz (100.2 kg)  01/05/22 219 lb 12.8 oz (99.7 kg)            Diabetes labs:  Lab Results  Component Value Date   HGBA1C 8.2 (H) 06/03/2022   HGBA1C 11.4 (A) 03/09/2022   HGBA1C 11.2 (A) 01/05/2022   Lab Results  Component Value Date   MICROALBUR 2.6 (H) 06/03/2022   LDLCALC 64 11/30/2021   CREATININE 0.97 06/03/2022     Allergies as of 06/09/2022  Reactions   Metformin And Related Nausea And Vomiting        Medication List        Accurate as of June 09, 2022  8:18 PM. If you have any questions, ask your nurse or doctor.          acetaminophen 500 MG tablet Commonly known as: TYLENOL Take 1,000 mg by mouth every 6 (six) hours as needed for mild pain.   Basaglar KwikPen 100 UNIT/ML Inject 60 Units into the skin every morning. And pen needles 1/day. What changed: how much to take   Dexcom G7 Sensor Misc 1 Device by Does not apply route as directed. Change sensor every 10 days Started by: Elayne Snare, MD   ferrous gluconate 324 MG tablet Commonly known as: FERGON Take 1 tablet (324 mg total) by mouth 2 (two) times daily with a meal.   naproxen sodium 220 MG tablet Commonly known as: ALEVE Take 440 mg by mouth 2 (two) times daily as needed (pain).   NovoLOG FlexPen 100 UNIT/ML FlexPen Generic drug: insulin aspart 6-10 U before meals Started  by: Elayne Snare, MD   omeprazole 40 MG capsule Commonly known as: PRILOSEC Take 1 capsule (40 mg total) by mouth daily.   OneTouch Verio test strip Generic drug: glucose blood 1 each by Other route 2 (two) times daily. And lancets 2/day.   simvastatin 20 MG tablet Commonly known as: ZOCOR TAKE 1 TABLET BY MOUTH AT BEDTIME   tiZANidine 2 MG tablet Commonly known as: ZANAFLEX Take 1 tablet (2 mg total) by mouth every 8 (eight) hours as needed for muscle spasms.        Allergies:  Allergies  Allergen Reactions   Metformin And Related Nausea And Vomiting    Past Medical History:  Diagnosis Date   Alcohol abuse    Angina    Chronic kidney disease    Diabetes mellitus    sees Dr. Renato Shin    Hyperlipidemia    Hypertension     Past Surgical History:  Procedure Laterality Date   COLONOSCOPY  05/06/2014   per Dr. Henrene Pastor, clear, repeat in 10 yrs    LAPAROSCOPIC APPENDECTOMY N/A 04/06/2017   Procedure: APPENDECTOMY LAPAROSCOPIC with primary repair of umbilical hernia;  Surgeon: Georganna Skeans, MD;  Location: [facility];  Service: General;  Laterality: N/A;    Family History  Problem Relation Age of Onset   Breast cancer Mother    Diabetes Father    Hypertension Father    Alcohol abuse Father    Diabetes Sister    Colon cancer Neg Hx    Pancreatic cancer Neg Hx    Stomach cancer Neg Hx     Social History:  reports that he quit smoking about 10 years ago. His smoking use included cigarettes. He smoked an average of .5 packs per day. He has never used smokeless tobacco. He reports current alcohol use. He reports that he does not use drugs.  Review of Systems:  Last diabetic eye exam date: Unknown  Last foot exam date: 9/22  Symptoms of neuropathy: None  Hypertension: Not present  BP Readings from Last 3 Encounters:  06/09/22 104/70  03/09/22 124/72  01/16/22 (!) 144/65    Lipids: Treated with 20 mg simvastatin by PCP    Lab Results  Component Value Date    CHOL 120 11/30/2021   CHOL 134 12/16/2020   CHOL 140 12/12/2019   Lab Results  Component Value Date   HDL 46.00  11/30/2021   HDL 59 12/16/2020   HDL 47.70 12/12/2019   Lab Results  Component Value Date   LDLCALC 64 11/30/2021   LDLCALC 62 12/16/2020   LDLCALC 79 12/12/2019   Lab Results  Component Value Date   TRIG 51.0 11/30/2021   TRIG 53 12/16/2020   TRIG 70.0 12/12/2019   Lab Results  Component Value Date   CHOLHDL 3 11/30/2021   CHOLHDL 2.3 12/16/2020   CHOLHDL 3 12/12/2019   No results found for: "LDLDIRECT"  He takes Prilosec for reflux   Examination:   BP 104/70   Pulse 73   Ht '5\' 11"'$  (1.803 m)   Wt 221 lb 3.2 oz (100.3 kg)   SpO2 99%   BMI 30.85 kg/m   Body mass index is 30.85 kg/m.    No pedal edema present  ASSESSMENT/ PLAN:    Diabetes type 1 ?:   Current regimen: Basaglar 60 units daily  A1c is consistently high recently and now 8.2 He is taking Basaglar only once a day and likely has postprandial hyperglycemia which he does not monitor Also may be getting some hypoglycemia unawareness with lab glucose 59 midmorning Likely is not getting even control of his basal insulin requirement with once a day Lantus Also difficult to assess his home readings as he is monitoring infrequently and did not bring monitor  He is a good candidate for more diabetes education, needs continuous glucose monitoring, basal bolus insulin regimen and a more evenly acting basal insulin  Recommendations:  Today discussed in detail the need for mealtime insulin to cover postprandial spikes, action of mealtime insulin, use of the insulin pen, timing and action of the rapid acting insulin as well as starting dose and dosage titration to target the two-hour reading of under 180 He will start with NOVOLOG FlexPen and initially start with 10 units before his main meal at lunch If he is eating a mixed meal with carbohydrates at dinnertime we will take at least 6 units to  start with Also needs 10 units if he is eating breakfast on weekends Discussed that if his 2-hour blood sugars are more than 180 he will go up 2 to 4 units for that meal  Monitoring will be done by Dexcom sensor and this was prescribed, he will need likely guidance on how to start this and use the phone app  BASAL insulin will be split into 2 doses with 20 units in the morning and 40 in the evening However likely needs to be switched to [place] or [place] subsequently  Also discussed rotation of insulin injection sites such as the upper arm or the outer thigh instead of just the inner thigh  Referral made for diabetes educator and he will also need some advice on meal planning, prevention of hypoglycemia and basic diabetes education Meanwhile he will make sure he gets some protein with every meal and low fat intake as much as possible  Recommended that he follow-up for his eye exams annually  HYPERCHOLESTEROLEMIA: This is at target with simvastatin and will have him continue follow-up with his PCP  Patient Instructions  NOVOLOG: This is a mealtime insulin to cover after meals spikes This is taken right before mealtimes to target the blood sugar about 2 hours later to be under 180  Start with 10 units before your lunch meal and if your sugar is still more than 180 a couple of hours later you may need 12-14 otherwise continue the same dose If your  sugars are below 100 within 4 hours of your shot you will need to reduce insulin  Also take 6 units at suppertime if eating a big meal and 10 units if eating a large meal in the morning  BASAGLAR will need to be divided to 20 units in the morning and 40 units at 8 PM  Make sure you have some protein with every meal    Elayne Snare 06/09/2022, 8:18 PM   Total visit time for evaluation and management of diabetes, review of all relevant records and labs and at least 20-minute counseling = 40 minutes

## 2022-06-17 ENCOUNTER — Other Ambulatory Visit: Payer: Self-pay | Admitting: Family Medicine

## 2022-06-24 ENCOUNTER — Telehealth: Payer: Self-pay | Admitting: Pharmacy Technician

## 2022-06-24 ENCOUNTER — Other Ambulatory Visit (HOSPITAL_COMMUNITY): Payer: Self-pay

## 2022-06-24 NOTE — Telephone Encounter (Addendum)
Patient Advocate Encounter   Received notification from CoverMyMeds that prior authorization for Dexcom G7 Sensor is required by his/her insurance [employer].   PA started on 06/24/22 Key [id number] PA Case ID: [id number] Status is pending    [facility] will continue to follow:  Patient Advocate Fax:  [phone]

## 2022-06-29 ENCOUNTER — Other Ambulatory Visit (HOSPITAL_COMMUNITY): Payer: Self-pay

## 2022-06-29 NOTE — Telephone Encounter (Signed)
Patient Advocate Encounter  Prior Authorization for [employer] have been approved.  (06/25/22 - CoverMyMeds- [facility])  PA# PA Case ID: [id number] Effective dates: 06/24/22 through 06/25/23  Per Test claim: Filled 06/25/22 at [facility]

## 2022-09-09 ENCOUNTER — Ambulatory Visit: Payer: BC Managed Care – PPO | Admitting: Endocrinology

## 2022-09-13 ENCOUNTER — Encounter: Payer: Self-pay | Admitting: Endocrinology

## 2022-09-13 ENCOUNTER — Ambulatory Visit (INDEPENDENT_AMBULATORY_CARE_PROVIDER_SITE_OTHER): Payer: BC Managed Care – PPO | Admitting: Endocrinology

## 2022-09-13 VITALS — BP 130/70 | HR 67 | Ht 71.0 in | Wt 229.6 lb

## 2022-09-13 DIAGNOSIS — E1165 Type 2 diabetes mellitus with hyperglycemia: Secondary | ICD-10-CM | POA: Diagnosis not present

## 2022-09-13 DIAGNOSIS — E7800 Pure hypercholesterolemia, unspecified: Secondary | ICD-10-CM | POA: Diagnosis not present

## 2022-09-13 DIAGNOSIS — Z794 Long term (current) use of insulin: Secondary | ICD-10-CM

## 2022-09-13 DIAGNOSIS — Z6832 Body mass index (BMI) 32.0-32.9, adult: Secondary | ICD-10-CM

## 2022-09-13 LAB — POCT GLYCOSYLATED HEMOGLOBIN (HGB A1C)

## 2022-09-13 MED ORDER — EMPAGLIFLOZIN 10 MG PO TABS: 10.0000 mg | ORAL_TABLET | Freq: Every day | ORAL | 3 refills | Status: DC

## 2022-09-13 NOTE — Patient Instructions (Addendum)
Novolog 12 units before Bfst  Call before refilling [employer]

## 2022-09-13 NOTE — Progress Notes (Signed)
Patient ID: Kenneth [name], male   DOB: 1956/11/21, 65 y.o.   MRN: [id number]            Reason for Appointment: Diabetes follow-up   History of Present Illness   Diagnosis date: 2008  Previous history:  Apparently initially diagnosed with diabetic ketoacidosis Again had ketoacidosis in 2011 Although he may have been on basal bolus insulin regimen in the past he has been only on Lantus since about 2015  Oral hypoglycemic drugs previously given: Metformin Insulin was started soon after diagnosis  A1c range in the last few years is: 4.9-14  Recent history:     Non-insulin hypoglycemic drugs: None     Insulin regimen: BASAGLAR 20 units in the morning and 40 in the evening, NovoLog 10 units in the morning and 6 in the evening     Side effects from medications: GI side effects from metformin  Current self management, blood sugar patterns and problems identified:  A1c is 7.5, previously 8.2 Compared to 11.4 previously He started the Dexcom sensor, was approved on the last visit  His blood sugars appear to be highly erratic although appears to be doing better in the last week He thinks his blood sugars are usually high in the evening from eating snacks and sweets  He does think that he is starting to take his NovoLog as directed on the last visit before starting to eat However his daily routine with his work and sleep is variable because of trying to work 2 jobs including sometimes at 4 AM However he is usually not very active after his breakfast around 9 AM and blood sugars seem to be higher, overall averaging 236 No hypoglycemia with splitting his basal insulin to twice a day He is complaining about having multiple injections However he did not let us know his [place] Liter was running out, was supposed to switch to [place] Currently doing his insulin injections in the lower part of his thighs and does not want to use the stomach He has previously done better seeing the dietitian,  but not go to the diabetes educator as recommended on the last visit for follow-up He has gained weight  Exercise: He is 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 and less in the afternoons  Diet management: Eating relatively small breakfast, mostly a biscuit      CGM data:  Blood sugars are much higher overall in the first week compared to the second week of the data In the first week his blood sugars were markedly increased at all times except midday and early afternoon In the second week his blood sugars are inconsistent with periods of hyperglycemia or within target range at all different times Generally he has HYPERGLYCEMIA after his first meal around 8-9 AM less consistently after his evening meal No hypoglycemia Overnight blood sugars are highly variable and mostly higher in the first week and more recently within the target range 3 days  CGM use % of time 79  2-week average/GV 220/39  Time in range        %  % Time Above 180 27  % Time above 250 37  % Time Below 70 0    Dietician visit: Most recent: 2020 Weight control:  Wt Readings from Last 3 Encounters:  09/13/22 229 lb 9.6 oz (104.1 kg)  06/09/22 221 lb 3.2 oz (100.3 kg)  03/09/22 220 lb 12.8 oz (100.2 kg)  Diabetes labs:  Lab Results  Component Value Date   HGBA1C 8.2 (H) 06/03/2022   HGBA1C 11.4 (A) 03/09/2022   HGBA1C 11.2 (A) 01/05/2022   Lab Results  Component Value Date   MICROALBUR 2.6 (H) 06/03/2022   LDLCALC 64 11/30/2021   CREATININE 0.97 06/03/2022   Lab Results  Component Value Date   FRUCTOSAMINE 391 (H) 11/03/2021   FRUCTOSAMINE 230 11/19/2019     Allergies as of 09/13/2022       Reactions   Metformin And Related Nausea And Vomiting        Medication List        Accurate as of September 13, 2022  8:39 AM. If you have any questions, ask your nurse or doctor.          acetaminophen 500 MG tablet Commonly known as: TYLENOL Take 1,000 mg by mouth  every 6 (six) hours as needed for mild pain.   Basaglar KwikPen 100 UNIT/ML Inject 60 Units into the skin every morning. And pen needles 1/day. What changed: how much to take   Dexcom G7 Sensor Misc 1 Device by Does not apply route as directed. Change sensor every 10 days   ferrous gluconate 324 MG tablet Commonly known as: FERGON Take 1 tablet (324 mg total) by mouth 2 (two) times daily with a meal.   naproxen sodium 220 MG tablet Commonly known as: ALEVE Take 440 mg by mouth 2 (two) times daily as needed (pain).   NovoLOG FlexPen 100 UNIT/ML FlexPen Generic drug: insulin aspart 6-10 U before meals   omeprazole 40 MG capsule Commonly known as: PRILOSEC Take 1 capsule (40 mg total) by mouth daily.   OneTouch Verio test strip Generic drug: glucose blood 1 each by Other route 2 (two) times daily. And lancets 2/day.   simvastatin 20 MG tablet Commonly known as: ZOCOR TAKE 1 TABLET BY MOUTH AT BEDTIME   tiZANidine 2 MG tablet Commonly known as: ZANAFLEX Take 1 tablet (2 mg total) by mouth every 8 (eight) hours as needed for muscle spasms.        Allergies:  Allergies  Allergen Reactions   Metformin And Related Nausea And Vomiting    Past Medical History:  Diagnosis Date   Alcohol abuse    Angina    Chronic kidney disease    Diabetes mellitus    sees Dr. Renato Shin    Hyperlipidemia    Hypertension     Past Surgical History:  Procedure Laterality Date   COLONOSCOPY  05/06/2014   per Dr. Henrene Pastor, clear, repeat in 10 yrs    LAPAROSCOPIC APPENDECTOMY N/A 04/06/2017   Procedure: APPENDECTOMY LAPAROSCOPIC with primary repair of umbilical hernia;  Surgeon: Georganna Skeans, MD;  Location: [facility];  Service: General;  Laterality: N/A;    Family History  Problem Relation Age of Onset   Breast cancer Mother    Diabetes Father    Hypertension Father    Alcohol abuse Father    Diabetes Sister    Colon cancer Neg Hx    Pancreatic cancer Neg Hx    Stomach cancer  Neg Hx     Social History:  reports that he quit smoking about 10 years ago. His smoking use included cigarettes. He smoked an average of .5 packs per day. He has never used smokeless tobacco. He reports current alcohol use. He reports that he does not use drugs.  Review of Systems:  Last diabetic eye exam date: Unknown  Last foot exam date:  9/22  Symptoms of neuropathy: None  Hypertension: Not present  BP Readings from Last 3 Encounters:  09/13/22 130/70  06/09/22 104/70  03/09/22 124/72    Lipids: Treated with 20 mg simvastatin by PCP    Lab Results  Component Value Date   CHOL 120 11/30/2021   CHOL 134 12/16/2020   CHOL 140 12/12/2019   Lab Results  Component Value Date   HDL 46.00 11/30/2021   HDL 59 12/16/2020   HDL 47.70 12/12/2019   Lab Results  Component Value Date   LDLCALC 64 11/30/2021   LDLCALC 62 12/16/2020   LDLCALC 79 12/12/2019   Lab Results  Component Value Date   TRIG 51.0 11/30/2021   TRIG 53 12/16/2020   TRIG 70.0 12/12/2019   Lab Results  Component Value Date   CHOLHDL 3 11/30/2021   CHOLHDL 2.3 12/16/2020   CHOLHDL 3 12/12/2019   No results found for: "LDLDIRECT"  He takes Prilosec for reflux   Examination:   BP 130/70   Pulse 67   Ht '5\' 11"'$  (1.803 m)   Wt 229 lb 9.6 oz (104.1 kg)   SpO2 97%   BMI 32.02 kg/m   Body mass index is 32.02 kg/m.      ASSESSMENT/ PLAN:    Diabetes type 2?:   Current regimen: Basaglar twice a day and NovoLog before meals   A1c has improved to 7.5  He is taking Basaglar smaller doses in the morning and higher doses overnight which is giving his blood sugars of more consistently controlled However he says his diet is still not very good and especially with snacks and sweets in the evenings blood sugars are averaging as much is 257 late evening  Also has frequently higher readings after breakfast with current dose of 10 units NovoLog  Recommendations:  Reviewed results of his CGM He will  let us know when he finishes Basaglar and switch to [place] 60 units daily Meanwhile go up at least to 12 units on the NovoLog in the morning May need additional 6 units late evening if he is having excessively high readings or high carbohydrate snacks He will need to follow-up with the diabetes educator to help adjust his insulin and work on the meal planning Also may be a candidate for the [place] Simplcity device if covered by insurance He needs to try and cut back on high carbohydrate and high fat snacks and sweets Trial of JARDIANCE to help both with blood sugar control, weight and long-term cardiovascular/renal benefits Discussed action of SGLT 2 drugs on lowering glucose by decreasing kidney absorption of glucose, benefits of weight loss and lower blood pressure, possible side effects including candidiasis and dosage regimen  He will start with 10 mg daily and advised him to increase his fluid intake  Needs follow-up eye exam  There are no Patient Instructions on file for this visit.   Elayne Snare 09/13/2022, 8:39 AM   Total visit time for evaluation and management of diabetes, review of all relevant records and labs and at least 20-minute counseling = 40 minutes

## 2022-09-15 ENCOUNTER — Encounter: Payer: Self-pay | Admitting: Endocrinology

## 2022-09-22 ENCOUNTER — Other Ambulatory Visit: Payer: Self-pay | Admitting: Family Medicine

## 2022-10-12 ENCOUNTER — Telehealth: Payer: Self-pay

## 2022-10-12 NOTE — Telephone Encounter (Signed)
Patient called because he is done with Basaglar. Per last office note you wanted him to call before refilling. Please advise.

## 2022-10-14 ENCOUNTER — Other Ambulatory Visit: Payer: Self-pay

## 2022-10-14 DIAGNOSIS — E1165 Type 2 diabetes mellitus with hyperglycemia: Secondary | ICD-10-CM

## 2022-10-14 MED ORDER — TRESIBA FLEXTOUCH 200 UNIT/ML ~~LOC~~ SOPN: PEN_INJECTOR | SUBCUTANEOUS | 2 refills | Status: DC

## 2022-10-14 NOTE — Telephone Encounter (Signed)
Medication has been sent to pharmacy and patient is aware.

## 2022-11-01 ENCOUNTER — Other Ambulatory Visit: Payer: Self-pay | Admitting: Endocrinology

## 2022-11-15 ENCOUNTER — Other Ambulatory Visit: Payer: Self-pay | Admitting: Endocrinology

## 2022-11-18 ENCOUNTER — Other Ambulatory Visit: Payer: Self-pay | Admitting: Endocrinology

## 2022-12-02 ENCOUNTER — Encounter: Payer: Self-pay | Admitting: Family Medicine

## 2022-12-02 ENCOUNTER — Ambulatory Visit: Payer: BC Managed Care – PPO | Admitting: Family Medicine

## 2022-12-02 VITALS — BP 128/70 | HR 85 | Temp 98.6°F | Wt 232.0 lb

## 2022-12-02 DIAGNOSIS — S46811A Strain of other muscles, fascia and tendons at shoulder and upper arm level, right arm, initial encounter: Secondary | ICD-10-CM

## 2022-12-02 MED ORDER — TRAMADOL HCL 50 MG PO TABS: 100.0000 mg | ORAL_TABLET | Freq: Every day | ORAL | 0 refills | Status: DC

## 2022-12-02 NOTE — Progress Notes (Signed)
   Subjective:    Patient ID: Kenneth [name], male    DOB: 01-Jul-1956, 66 y.o.   MRN: [id number]  HPI Here for 10 days of pain in the upper right arm. No recent trauma, but he notes that right around the time this pain started he began a new work routine on his job. 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 About 10 days ago thesedumpsters were changed to higher ones so that he had to lift the bags over his head to throw them in. He has tried several topical creams and has taken Ibuprofen with no relief. The pain mostly bothers him at night. Fortunately he discovered a way to take the bags to a different dumpster that is low to the ground again.    Review of Systems  Constitutional: Negative.   Respiratory: Negative.    Cardiovascular: Negative.   Musculoskeletal:  Positive for myalgias.       Objective:   Physical Exam Constitutional:      Appearance: Normal appearance.  Cardiovascular:     Rate and Rhythm: Normal rate and regular rhythm.     Pulses: Normal pulses.     [name] sounds: Normal [name] sounds.  Pulmonary:     Effort: Pulmonary effort is normal.     Breath sounds: Normal breath sounds.  Musculoskeletal:     Comments: The right shoulder has full ROM. He is tender over the right deltoid muscle, and abduction of the right arm against resistance is painful   Neurological:     Mental Status: He is alert.           Assessment & Plan:  Right deltoid strain. He can try Tramadol at bedtime to help with sleep. This should heal over the next week or two. Recheck as needed.  Alysia Penna, MD

## 2022-12-17 ENCOUNTER — Other Ambulatory Visit: Payer: Self-pay | Admitting: Family Medicine

## 2022-12-27 ENCOUNTER — Other Ambulatory Visit (INDEPENDENT_AMBULATORY_CARE_PROVIDER_SITE_OTHER): Payer: BC Managed Care – PPO

## 2022-12-27 DIAGNOSIS — E1065 Type 1 diabetes mellitus with hyperglycemia: Secondary | ICD-10-CM

## 2022-12-27 DIAGNOSIS — E1165 Type 2 diabetes mellitus with hyperglycemia: Secondary | ICD-10-CM | POA: Diagnosis not present

## 2022-12-27 DIAGNOSIS — Z794 Long term (current) use of insulin: Secondary | ICD-10-CM | POA: Diagnosis not present

## 2022-12-27 LAB — BASIC METABOLIC PANEL
BUN: 14 mg/dL (ref 6–23)
CO2: 29 mEq/L (ref 19–32)
Calcium: 9.3 mg/dL (ref 8.4–10.5)
Chloride: 104 mEq/L (ref 96–112)
Creatinine, Ser: 1.02 mg/dL (ref 0.40–1.50)
GFR: 77.92 mL/min (ref >60.00)
Glucose, Bld: 58 mg/dL — ABNORMAL LOW (ref 70–99)
Potassium: 3.9 mEq/L (ref 3.5–5.1)
Sodium: 138 mEq/L (ref 135–145)

## 2022-12-27 LAB — HEMOGLOBIN A1C: Hgb A1c MFr Bld: 7 % — ABNORMAL HIGH (ref 4.6–6.5)

## 2022-12-28 ENCOUNTER — Encounter: Payer: Self-pay | Admitting: Family Medicine

## 2022-12-28 ENCOUNTER — Ambulatory Visit (INDEPENDENT_AMBULATORY_CARE_PROVIDER_SITE_OTHER): Payer: BC Managed Care – PPO | Admitting: Family Medicine

## 2022-12-28 VITALS — BP 120/86 | HR 70 | Temp 98.1°F | Ht 69.5 in | Wt 226.6 lb

## 2022-12-28 DIAGNOSIS — L989 Disorder of the skin and subcutaneous tissue, unspecified: Secondary | ICD-10-CM | POA: Diagnosis not present

## 2022-12-28 DIAGNOSIS — Z0000 Encounter for general adult medical examination without abnormal findings: Secondary | ICD-10-CM

## 2022-12-28 DIAGNOSIS — R972 Elevated prostate specific antigen [PSA]: Secondary | ICD-10-CM

## 2022-12-28 LAB — FRUCTOSAMINE: Fructosamine: 319 umol/L — ABNORMAL HIGH (ref 0–285)

## 2022-12-28 MED ORDER — OMEPRAZOLE 40 MG PO CPDR: 40.0000 mg | DELAYED_RELEASE_CAPSULE | Freq: Every day | ORAL | 3 refills | Status: DC

## 2022-12-28 NOTE — Progress Notes (Signed)
Subjective:    Patient ID: Kenneth [name], male    DOB: 06-04-1956, 67 y.o.   MRN: [id number]  HPI Here for a well exam. He feels fine. He asks me to check a lesion on his left  thigh that appeared months ago, but it has been getting larger. He sees Dr. Dwyane Dee for his diabetes, and he had labs drawn yesterday before an upcomin appt. His A1c was 7.0.    Review of Systems  Constitutional: Negative.   HENT: Negative.    Eyes: Negative.   Respiratory: Negative.    Cardiovascular: Negative.   Gastrointestinal: Negative.   Genitourinary: Negative.   Musculoskeletal: Negative.   Skin: Negative.   Neurological: Negative.   Psychiatric/Behavioral: Negative.         Objective:   Physical Exam Constitutional:      General: He is not in acute distress.    Appearance: Normal appearance. He is well-developed. He is not diaphoretic.  HENT:     Head: Normocephalic and atraumatic.     Right Ear: External ear normal.     Left Ear: External ear normal.     Nose: Nose normal.     Mouth/Throat:     Pharynx: No oropharyngeal exudate.  Eyes:     General: No scleral icterus.       Right eye: No discharge.        Left eye: No discharge.     Conjunctiva/sclera: Conjunctivae normal.     Pupils: Pupils are equal, round, and reactive to light.  Neck:     Thyroid: No thyromegaly.     Vascular: No JVD.     Trachea: No tracheal deviation.  Cardiovascular:     Rate and Rhythm: Normal rate and regular rhythm.     Heart sounds: Normal heart sounds. No murmur heard.    No friction rub. No gallop.  Pulmonary:     Effort: Pulmonary effort is normal. No respiratory distress.     Breath sounds: Normal breath sounds. No wheezing or rales.  Chest:     Chest wall: No tenderness.  Abdominal:     General: Bowel sounds are normal. There is no distension.     Palpations: Abdomen is soft. There is no mass.     Tenderness: There is no abdominal tenderness. There is no guarding or rebound.   Genitourinary:    Penis: Normal. No tenderness.      Testes: Normal.     Prostate: Normal.     Rectum: Normal. Guaiac result negative.  Musculoskeletal:        General: No tenderness. Normal range of motion.     Cervical back: Neck supple.  Lymphadenopathy:     Cervical: No cervical adenopathy.  Skin:    General: Skin is warm and dry.     Coloration: Skin is not pale.     Findings: No erythema or rash.     Comments: There is a 0.8 cm nodular black lesion on the left inner thigh   Neurological:     General: No focal deficit present.     Mental Status: He is alert and oriented to person, place, and time.     Cranial Nerves: No cranial nerve deficit.     Motor: No abnormal muscle tone.     Coordination: Coordination normal.     Deep Tendon Reflexes: Reflexes are normal and symmetric. Reflexes normal.  Psychiatric:        Behavior: Behavior normal.  Thought Content: Thought content normal.        Judgment: Judgment normal.           Assessment & Plan:  Well exam. We discussed diet and exercise. Get fasting labs. Refer to Dermatology to evaluate the thigh lesion. Alysia Penna, MD

## 2022-12-29 ENCOUNTER — Encounter: Payer: Self-pay | Admitting: Endocrinology

## 2022-12-29 ENCOUNTER — Ambulatory Visit (INDEPENDENT_AMBULATORY_CARE_PROVIDER_SITE_OTHER): Payer: BC Managed Care – PPO | Admitting: Endocrinology

## 2022-12-29 VITALS — BP 126/76 | HR 72 | Ht 69.5 in | Wt 226.0 lb

## 2022-12-29 DIAGNOSIS — Z794 Long term (current) use of insulin: Secondary | ICD-10-CM

## 2022-12-29 DIAGNOSIS — E1165 Type 2 diabetes mellitus with hyperglycemia: Secondary | ICD-10-CM | POA: Diagnosis not present

## 2022-12-29 MED ORDER — EMPAGLIFLOZIN 25 MG PO TABS: 25.0000 mg | ORAL_TABLET | Freq: Every day | ORAL | 2 refills | Status: DC

## 2022-12-29 NOTE — Patient Instructions (Signed)
Novolog 15 min before meals4  Double Jardiance

## 2022-12-29 NOTE — Progress Notes (Signed)
Patient ID: Kenneth [name], male   DOB: December 30, 1955, 67 y.o.   MRN: [id number]            Reason for Appointment: Diabetes follow-up   History of Present Illness   Diagnosis date: 2008  Previous history:  Apparently initially diagnosed with diabetic ketoacidosis Again had ketoacidosis in 2011 Although he may have been on basal bolus insulin regimen in the past he has been only on Lantus since about 2015  Oral hypoglycemic drugs previously given: Metformin Insulin was started soon after diagnosis  A1c range in the last few years is: 4.9-14  Recent history:     Non-insulin hypoglycemic drugs: Jardiance 10 mg     Insulin regimen: Tresiba 60 units daily NovoLog 10 units before meals    Side effects from medications: GI side effects from metformin  Current self management, blood sugar patterns and problems identified:  A1c is 7 compared to 8.2 However this is not in line with his recent Dexcom data with GMI 9% As discussed in the CGM description below his blood sugars are much higher through at least 1/2 and [place] somewhat better Also in the last few days he has highly variable overnight readings with occasional tendency to relatively low sugars However he is eating a meal around 4 AM and may be taking his NovoLog an hour before before he leaves for work causing the low sugars before he eats He is not adjusting his mealtime dose based on what he is eating and periodically has significant spikes in his blood sugars after dinner He did start using Jardiance on the last visit which may be benefiting Also has lost a slight amount of weight, previously had tendency to weight gain No side effects with this Glucose was low in the lab and also on his CGM, may be sometimes tending to get low normal readings from being very active at work at night  Exercise: He 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  Usually blood sugars in the second week overnight have been still significantly higher with the first part of the night and may be variably improved by mornings Hypoglycemia is being documented only twice in the morning hours overnight recently POSTPRANDIAL hyperglycemia was very significant at all times in the first week including her first meal After dinner blood sugars in the second week are higher only on 2 or 3 occasions going up to as much as 350 Otherwise still having some high postprandial readings after breakfast at times even in the second week also   Data for the last 2 weeks  CGM use % of time 93  2-week average/GV 237  Time in range 32     %  % Time Above 180 27  % Time above 250 40  % Time Below 70 0    Previous data:  CGM use % of time 79  2-week average/GV 220/39  Time in range        %  % Time Above 180 27  % Time above 250 37  % Time Below 70 0    Dietician visit: Most recent: 2020 Weight control:  Wt Readings from Last 3 Encounters:  12/29/22  226 lb (102.5 kg)  12/28/22 226 lb 9.6 oz (102.8 kg)  12/02/22 232 lb (105.2 kg)            Diabetes labs:  Lab Results  Component Value Date   HGBA1C 7.0 (H) 12/27/2022   HGBA1C 8.2 (H) 06/03/2022   HGBA1C 11.4 (A) 03/09/2022   Lab Results  Component Value Date   MICROALBUR 2.6 (H) 06/03/2022   LDLCALC 64 11/30/2021   CREATININE 1.02 12/27/2022   Lab Results  Component Value Date   FRUCTOSAMINE 319 (H) 12/27/2022   FRUCTOSAMINE 391 (H) 11/03/2021   FRUCTOSAMINE 230 11/19/2019     Allergies as of 12/29/2022       Reactions   Metformin And Related Nausea And Vomiting        Medication List        Accurate as of December 29, 2022  8:29 PM. If you have any questions, ask your nurse or  doctor.          acetaminophen 500 MG tablet Commonly known as: TYLENOL Take 1,000 mg by mouth every 6 (six) hours as needed for mild pain.   Dexcom G7 Sensor Misc CHANGE SENSOR ONCE EVERY 10 DAYS AS DIRECTED   empagliflozin 25 MG Tabs tablet Commonly known as: Jardiance Take 1 tablet (25 mg total) by mouth daily before breakfast. What changed:  medication strength how much to take when to take this Changed by: Elayne Snare, MD   naproxen sodium 220 MG tablet Commonly known as: ALEVE Take 440 mg by mouth 2 (two) times daily as needed (pain).   NovoLOG FlexPen 100 UNIT/ML FlexPen Generic drug: insulin aspart INJECT 6 TO 10 UNITS SUBCUTANEOUSLY BEFORE MEAL(S)   omeprazole 40 MG capsule Commonly known as: PRILOSEC Take 1 capsule (40 mg total) by mouth daily.   OneTouch Verio test strip Generic drug: glucose blood 1 each by Other route 2 (two) times daily. And lancets 2/day.   simvastatin 20 MG tablet Commonly known as: ZOCOR TAKE 1 TABLET BY MOUTH AT BEDTIME   tiZANidine 2 MG tablet Commonly known as: ZANAFLEX Take 1 tablet (2 mg total) by mouth every 8 (eight) hours as needed for muscle spasms.   traMADol 50 MG tablet Commonly known as: ULTRAM Take 2 tablets (100 mg total) by mouth at bedtime.   Tyler Aas FlexTouch 200 UNIT/ML FlexTouch Pen Generic drug: insulin degludec Inject 60 units into skin daily        Allergies:  Allergies  Allergen Reactions   Metformin And Related Nausea And Vomiting    Past Medical History:  Diagnosis Date   Alcohol abuse    Angina    Chronic kidney disease    Diabetes mellitus    sees Dr. Renato Shin    Hyperlipidemia    Hypertension     Past Surgical History:  Procedure Laterality Date   COLONOSCOPY  05/06/2014   per Dr. Henrene Pastor, clear, repeat in 10 yrs    LAPAROSCOPIC APPENDECTOMY N/A 04/06/2017   Procedure: APPENDECTOMY LAPAROSCOPIC with primary repair of umbilical hernia;  Surgeon: Georganna Skeans, MD;  Location:  [facility];  Service: General;  Laterality: N/A;    Family History  Problem Relation Age of Onset   Breast cancer Mother    Diabetes Father    Hypertension Father    Alcohol abuse Father    Diabetes Sister    Colon cancer Neg Hx    Pancreatic cancer Neg Hx    Stomach cancer Neg Hx  Social History:  reports that he quit smoking about 10 years ago. His smoking use included cigarettes. He smoked an average of .5 packs per day. He has never used smokeless tobacco. He reports current alcohol use. He reports that he does not use drugs.  Review of Systems:  Last diabetic eye exam date: Unknown  Last foot exam date: 9/22  Symptoms of neuropathy: None  Hypertension: Not present  BP Readings from Last 3 Encounters:  12/29/22 126/76  12/28/22 120/86  12/02/22 128/70    Lipids: Treated with 20 mg simvastatin by PCP    Lab Results  Component Value Date   CHOL 120 11/30/2021   CHOL 134 12/16/2020   CHOL 140 12/12/2019   Lab Results  Component Value Date   HDL 46.00 11/30/2021   HDL 59 12/16/2020   HDL 47.70 12/12/2019   Lab Results  Component Value Date   LDLCALC 64 11/30/2021   LDLCALC 62 12/16/2020   LDLCALC 79 12/12/2019   Lab Results  Component Value Date   TRIG 51.0 11/30/2021   TRIG 53 12/16/2020   TRIG 70.0 12/12/2019   Lab Results  Component Value Date   CHOLHDL 3 11/30/2021   CHOLHDL 2.3 12/16/2020   CHOLHDL 3 12/12/2019   No results found for: "LDLDIRECT"  He takes Prilosec for reflux   Examination:   BP 126/76   Pulse 72   Ht 5' 9.5" (1.765 m)   Wt 226 lb (102.5 kg)   SpO2 95%   BMI 32.90 kg/m   Body mass index is 32.9 kg/m.      ASSESSMENT/ PLAN:    Diabetes type 2?:   Current regimen: Basaglar twice a day and NovoLog before meals   A1c has improved to 7 Also fructosamine is only mildly increased at 319  Blood sugars were assessed with the Dexcom sensor Although his blood sugars are relatively better in the last few days he had  likely gone off his diet over the holidays and blood sugars are markedly increased late December and first 3 days of January He is generally more active at work including at night His day-to-day management and blood sugar patterns were discussed  Likely appears to be benefiting from taking Jardiance with overall better blood sugars, slight weight loss compared to previous weight gain and no adverse effects on renal function  Recommendations:  No change in [place] at this time He will likely need to take only 8 units for his meal at work when he is more active Make sure he takes his NovoLog only 15 minutes before his meal rather than 1 hour before especially during the night Discussed moderating high-fat foods and carbohydrates Given list of preferred foods to have at restaurants and also given booklet on nutritional information at restaurants He will benefit from diabetes education but has difficulty finding the time to take off from work Increase Jardiance to 25 mg He will call if he starts getting any low sugars  Needs follow-up eye exam  Patient Instructions  Novolog 15 min before meals4  Double Jardiance   Elayne Snare 12/29/2022, 8:29 PM   Total visit time for evaluation and management of diabetes and counseling = 30 minutes

## 2022-12-30 ENCOUNTER — Telehealth: Payer: Self-pay | Admitting: Family Medicine

## 2022-12-30 ENCOUNTER — Other Ambulatory Visit: Payer: Self-pay | Admitting: Endocrinology

## 2022-12-30 NOTE — Telephone Encounter (Signed)
Called patient, information given.   Informed patient to contact office if any issues.

## 2022-12-30 NOTE — Telephone Encounter (Signed)
Pt has a cut on his head which has healed but now he has a headache. Says this area was looked at during his cpe on 12/28/22. Seeking advice on what he should do for the headache.

## 2022-12-30 NOTE — Telephone Encounter (Signed)
Apply some Aloe with Lidocaine several times a day (OTC)

## 2023-01-04 ENCOUNTER — Other Ambulatory Visit (INDEPENDENT_AMBULATORY_CARE_PROVIDER_SITE_OTHER): Payer: BC Managed Care – PPO

## 2023-01-04 DIAGNOSIS — Z0000 Encounter for general adult medical examination without abnormal findings: Secondary | ICD-10-CM | POA: Diagnosis not present

## 2023-01-04 LAB — LIPID PANEL
Cholesterol: 122 mg/dL (ref 0–200)
HDL: 35.5 mg/dL — ABNORMAL LOW (ref >39.00)
LDL Cholesterol: 74 mg/dL (ref 0–99)
NonHDL: 86.67
Total CHOL/HDL Ratio: 3
Triglycerides: 64 mg/dL (ref 0.0–149.0)
VLDL: 12.8 mg/dL (ref 0.0–40.0)

## 2023-01-04 LAB — CBC WITH DIFFERENTIAL/PLATELET
Basophils Absolute: 0 10*3/uL (ref 0.0–0.1)
Basophils Relative: 0.6 % (ref 0.0–3.0)
Eosinophils Absolute: 0.1 10*3/uL (ref 0.0–0.7)
Eosinophils Relative: 1.4 % (ref 0.0–5.0)
HCT: 38.5 % — ABNORMAL LOW (ref 39.0–52.0)
Hemoglobin: 12.9 g/dL — ABNORMAL LOW (ref 13.0–17.0)
Lymphocytes Relative: 29.2 % (ref 12.0–46.0)
Lymphs Abs: 1.8 10*3/uL (ref 0.7–4.0)
MCHC: 33.5 g/dL (ref 30.0–36.0)
MCV: 89.5 fl (ref 78.0–100.0)
Monocytes Absolute: 0.6 10*3/uL (ref 0.1–1.0)
Monocytes Relative: 9.2 % (ref 3.0–12.0)
Neutro Abs: 3.7 10*3/uL (ref 1.4–7.7)
Neutrophils Relative %: 59.6 % (ref 43.0–77.0)
Platelets: 285 10*3/uL (ref 150.0–400.0)
RBC: 4.3 Mil/uL (ref 4.22–5.81)
RDW: 13.5 % (ref 11.5–15.5)
WBC: 6.2 10*3/uL (ref 4.0–10.5)

## 2023-01-04 LAB — HEPATIC FUNCTION PANEL
ALT: 13 U/L (ref 0–53)
AST: 11 U/L (ref 0–37)
Albumin: 4.1 g/dL (ref 3.5–5.2)
Alkaline Phosphatase: 97 U/L (ref 39–117)
Bilirubin, Direct: 0.1 mg/dL (ref 0.0–0.3)
Total Bilirubin: 0.5 mg/dL (ref 0.2–1.2)
Total Protein: 7.5 g/dL (ref 6.0–8.3)

## 2023-01-04 LAB — PSA: PSA: 5.53 ng/mL — ABNORMAL HIGH (ref 0.10–4.00)

## 2023-01-04 LAB — TSH: TSH: 3.55 u[IU]/mL (ref 0.35–5.50)

## 2023-01-05 ENCOUNTER — Other Ambulatory Visit: Payer: Self-pay | Admitting: Endocrinology

## 2023-01-05 ENCOUNTER — Telehealth: Payer: Self-pay | Admitting: Family Medicine

## 2023-01-05 DIAGNOSIS — E1165 Type 2 diabetes mellitus with hyperglycemia: Secondary | ICD-10-CM

## 2023-01-05 NOTE — Addendum Note (Signed)
Addended by: Alysia Penna A on: 01/05/2023 08:04 AM   Modules accepted: Orders

## 2023-01-05 NOTE — Telephone Encounter (Signed)
Pt is calling and would like blood work results 

## 2023-01-05 NOTE — Telephone Encounter (Signed)
Spoke with patient explained what Urologist is.      Requested to see Urology in [place].

## 2023-01-05 NOTE — Telephone Encounter (Signed)
Pt called back stating he did not want blood results, he just didn't understand what a urologist did. Would like a referral to a urologist in [place] (referral [phone] )

## 2023-01-06 ENCOUNTER — Encounter: Payer: Self-pay | Admitting: Endocrinology

## 2023-01-06 NOTE — Telephone Encounter (Signed)
Spoke with patient informed referral has been sent to [facility].

## 2023-01-26 ENCOUNTER — Ambulatory Visit (INDEPENDENT_AMBULATORY_CARE_PROVIDER_SITE_OTHER): Payer: BC Managed Care – PPO | Admitting: Family Medicine

## 2023-01-26 ENCOUNTER — Encounter: Payer: Self-pay | Admitting: Family Medicine

## 2023-01-26 VITALS — BP 142/78 | HR 90 | Temp 99.4°F | Wt 230.0 lb

## 2023-01-26 DIAGNOSIS — J029 Acute pharyngitis, unspecified: Secondary | ICD-10-CM

## 2023-01-26 DIAGNOSIS — R0989 Other specified symptoms and signs involving the circulatory and respiratory systems: Secondary | ICD-10-CM

## 2023-01-26 DIAGNOSIS — J020 Streptococcal pharyngitis: Secondary | ICD-10-CM

## 2023-01-26 LAB — POCT RAPID STREP A (OFFICE): Rapid Strep A Screen: POSITIVE — AB

## 2023-01-26 LAB — POC COVID19 BINAXNOW: SARS Coronavirus 2 Ag: NEGATIVE

## 2023-01-26 LAB — POCT INFLUENZA A/B
Influenza A, POC: NEGATIVE
Influenza B, POC: NEGATIVE

## 2023-01-26 MED ORDER — CEFUROXIME AXETIL 500 MG PO TABS: 500.0000 mg | ORAL_TABLET | Freq: Two times a day (BID) | ORAL | 0 refills | Status: AC

## 2023-01-26 NOTE — Progress Notes (Signed)
   Subjective:    Patient ID: Kenneth [name], male    DOB: 10-11-1956, 67 y.o.   MRN: [id number]  HPI Here for one week of ST, head congestion, PND and a dry cough. He has a low grade fever. Drinking fluids and taking Tylenol.    Review of Systems  Constitutional:  Positive for fever.  HENT:  Positive for congestion, postnasal drip and sore throat. Negative for ear pain and sinus pain.   Eyes: Negative.   Respiratory:  Positive for cough. Negative for shortness of breath and wheezing.        Objective:   Physical Exam Constitutional:      Appearance: Normal appearance. He is not ill-appearing.  HENT:     Right Ear: Tympanic membrane, ear canal and external ear normal.     Left Ear: Tympanic membrane, ear canal and external ear normal.     Nose: Nose normal.     Mouth/Throat:     Pharynx: Posterior oropharyngeal erythema present. No oropharyngeal exudate.  Eyes:     Conjunctiva/sclera: Conjunctivae normal.  Pulmonary:     Effort: Pulmonary effort is normal.     Breath sounds: Normal breath sounds.  Lymphadenopathy:     Cervical: No cervical adenopathy.  Neurological:     Mental Status: He is alert.           Assessment & Plan:  Strep pharyngitis, treat with 10 days of Cefuroxime.  Alysia Penna, MD

## 2023-02-08 ENCOUNTER — Other Ambulatory Visit: Payer: Self-pay | Admitting: Urology

## 2023-02-08 DIAGNOSIS — R972 Elevated prostate specific antigen [PSA]: Secondary | ICD-10-CM

## 2023-02-10 ENCOUNTER — Other Ambulatory Visit: Payer: Self-pay | Admitting: Endocrinology

## 2023-02-10 DIAGNOSIS — E1165 Type 2 diabetes mellitus with hyperglycemia: Secondary | ICD-10-CM

## 2023-03-07 ENCOUNTER — Other Ambulatory Visit: Payer: Self-pay | Admitting: Endocrinology

## 2023-03-07 DIAGNOSIS — E1165 Type 2 diabetes mellitus with hyperglycemia: Secondary | ICD-10-CM

## 2023-03-09 ENCOUNTER — Ambulatory Visit
Admission: RE | Admit: 2023-03-09 | Discharge: 2023-03-09 | Disposition: A | Discharge status: Home / Self Care | Payer: BC Managed Care – PPO | Source: Ambulatory Visit | Attending: Urology | Admitting: Urology

## 2023-03-09 DIAGNOSIS — R972 Elevated prostate specific antigen [PSA]: Secondary | ICD-10-CM

## 2023-03-09 MED ORDER — GADOPICLENOL 0.5 MMOL/ML IV SOLN
10.0000 mL | Freq: Once | INTRAVENOUS | Status: AC | PRN
  Administered 2023-03-09: 10 mL via INTRAVENOUS

## 2023-03-13 ENCOUNTER — Other Ambulatory Visit: Payer: Self-pay | Admitting: Family Medicine

## 2023-03-29 ENCOUNTER — Other Ambulatory Visit (INDEPENDENT_AMBULATORY_CARE_PROVIDER_SITE_OTHER): Payer: BC Managed Care – PPO

## 2023-03-29 DIAGNOSIS — E1165 Type 2 diabetes mellitus with hyperglycemia: Secondary | ICD-10-CM | POA: Diagnosis not present

## 2023-03-29 DIAGNOSIS — Z794 Long term (current) use of insulin: Secondary | ICD-10-CM | POA: Diagnosis not present

## 2023-03-29 LAB — BASIC METABOLIC PANEL
BUN: 13 mg/dL (ref 6–23)
CO2: 28 mEq/L (ref 19–32)
Calcium: 9.3 mg/dL (ref 8.4–10.5)
Chloride: 102 mEq/L (ref 96–112)
Creatinine, Ser: 0.97 mg/dL (ref 0.40–1.50)
GFR: 82.62 mL/min (ref >60.00)
Glucose, Bld: 121 mg/dL — ABNORMAL HIGH (ref 70–99)
Potassium: 4.1 mEq/L (ref 3.5–5.1)
Sodium: 138 mEq/L (ref 135–145)

## 2023-03-29 LAB — HEMOGLOBIN A1C: Hgb A1c MFr Bld: 7.3 % — ABNORMAL HIGH (ref 4.6–6.5)

## 2023-04-01 ENCOUNTER — Encounter: Payer: Self-pay | Admitting: Endocrinology

## 2023-04-01 ENCOUNTER — Other Ambulatory Visit: Payer: Self-pay | Admitting: Endocrinology

## 2023-04-01 ENCOUNTER — Ambulatory Visit (INDEPENDENT_AMBULATORY_CARE_PROVIDER_SITE_OTHER): Payer: BC Managed Care – PPO | Admitting: Endocrinology

## 2023-04-01 VITALS — BP 124/70 | HR 84 | Ht 69.5 in | Wt 225.0 lb

## 2023-04-01 DIAGNOSIS — Z794 Long term (current) use of insulin: Secondary | ICD-10-CM

## 2023-04-01 DIAGNOSIS — E1165 Type 2 diabetes mellitus with hyperglycemia: Secondary | ICD-10-CM

## 2023-04-01 MED ORDER — EMPAGLIFLOZIN 25 MG PO TABS: 25.0000 mg | ORAL_TABLET | Freq: Every day | ORAL | 2 refills | Status: DC

## 2023-04-01 NOTE — Progress Notes (Signed)
Patient ID: Kenneth [name], male   DOB: [birth year], 67 y.o.   MRN: [id number]            Reason for Appointment: Diabetes follow-up   History of Present Illness   Diagnosis date: 2008  Previous history:  Apparently initially diagnosed with diabetic ketoacidosis Again had ketoacidosis in 2011 Although he may have been on basal bolus insulin regimen in the past he has been only on Lantus since about 2015  Oral hypoglycemic drugs previously given: Metformin Insulin was started soon after diagnosis  A1c range in the last few years is: 4.9-14  Recent history:     Non-insulin hypoglycemic drugs: Jardiance, Currently not taking     Insulin regimen: Tresiba 60 units daily NovoLog As below    Side effects from medications: GI side effects from metformin  Current self management, blood sugar patterns and problems identified:  A1c is 7.3  His blood sugars are much higher when seen on his Dexcom sensor and similar to the previous data He was told to go up to 25 mg Jardiance on the last visit but he has not taken it since 2/24 because he thought it was upsetting his stomach Previously had been doing fairly well with this He says that his blood sugars will be higher from not taking NovoLog while at work in the evening as well as generally not taking it at lunchtime Also the will sometimes drink regular soft drinks At night he may eat more sweets also His weight is about the same HIGHEST average blood sugar on his sensor data is 327 around midnight  Occasionally may feel hypoglycemic around 6 AM after breakfast when he is only eating a banana and a snack bar Lab glucose was 121 in the morning  Exercise: He 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 in the morning and less in the afternoons  Diet management: Eating 3 meals a day      CGM data:   Overnight blood sugars are generally markedly increased late at night and generally start decreasing after midnight but  more persistently high into the mornings in the second week Has blood sugars after breakfast which is around 4 AM usually decreased further and may be low normal by 6-7 AM but no hypoglycemia seen Blood sugars after his lunch around midday are variably high but generally go up significantly and bedtime and stay persistently high the rest of the day Blood sugars after dinner are showing significant increase in the first week and stay persistently high in the second week No hypoglycemia with occasional low normal readings between 12 PM and 2 PM Overall time in range is similar to the last visit  Data for the last 2 weeks  CGM use % of time   2-week average/GV 234  Time in range  34      %  % Time Above 180 24  % Time above 250 41  % Time Below 70      Previously:  CGM use % of time 93  2-week average/GV 237  Time in range 32     %  % Time Above 180 27  % Time above 250 40  % Time Below 70 0     Dietician visit: Most recent: 2020  Weight control:  Wt Readings from Last 3 Encounters:  04/01/23 225 lb (102.1 kg)  01/26/23 230 lb (104.3 kg)  12/29/22 226 lb (102.5 kg)  Diabetes labs:  Lab Results  Component Value Date   HGBA1C 7.3 (H) 03/29/2023   HGBA1C 7.0 (H) 12/27/2022   HGBA1C 8.2 (H) 06/03/2022   Lab Results  Component Value Date   MICROALBUR 2.6 (H) 06/03/2022   LDLCALC 74 01/04/2023   CREATININE 0.97 03/29/2023   Lab Results  Component Value Date   FRUCTOSAMINE 319 (H) 12/27/2022   FRUCTOSAMINE 391 (H) 11/03/2021   FRUCTOSAMINE 230 11/19/2019     Allergies as of 04/01/2023       Reactions   Metformin And Related Nausea And Vomiting        Medication List        Accurate as of April 01, 2023  9:51 AM. If you have any questions, ask your nurse or doctor.          STOP taking these medications    acetaminophen 500 MG tablet Commonly known as: TYLENOL Stopped by: Reather Littler, MD       TAKE these medications    Dexcom G7  Sensor Misc CHANGE SENSOR ONCE EVERY 10 DAYS AS DIRECTED   empagliflozin 25 MG Tabs tablet Commonly known as: Jardiance Take 1 tablet (25 mg total) by mouth daily before breakfast.   naproxen sodium 220 MG tablet Commonly known as: ALEVE Take 440 mg by mouth 2 (two) times daily as needed (pain).   NovoLOG FlexPen 100 UNIT/ML FlexPen Generic drug: insulin aspart Take 8 Novolog in am 12 at lunch and 15 at supper What changed: additional instructions Changed by: Reather Littler, MD   omeprazole 40 MG capsule Commonly known as: PRILOSEC Take 1 capsule (40 mg total) by mouth daily.   OneTouch Verio test strip Generic drug: glucose blood 1 each by Other route 2 (two) times daily. And lancets 2/day.   simvastatin 20 MG tablet Commonly known as: ZOCOR TAKE 1 TABLET BY MOUTH AT BEDTIME   tiZANidine 2 MG tablet Commonly known as: ZANAFLEX Take 1 tablet (2 mg total) by mouth every 8 (eight) hours as needed for muscle spasms.   traMADol 50 MG tablet Commonly known as: ULTRAM Take 2 tablets (100 mg total) by mouth at bedtime.   Evaristo Bury FlexTouch 200 UNIT/ML FlexTouch Pen Generic drug: insulin degludec INJECT 60 UNITS SUBCUTANEOUSLY TO SKIN ONCE DAILY        Allergies:  Allergies  Allergen Reactions   Metformin And Related Nausea And Vomiting    Past Medical History:  Diagnosis Date   Alcohol abuse    Angina    Chronic kidney disease    Diabetes mellitus    sees Dr. Romero Belling    Hyperlipidemia    Hypertension     Past Surgical History:  Procedure Laterality Date   COLONOSCOPY  05/06/2014   per Dr. Marina Goodell, clear, repeat in 10 yrs    LAPAROSCOPIC APPENDECTOMY N/A 04/06/2017   Procedure: APPENDECTOMY LAPAROSCOPIC with primary repair of umbilical hernia;  Surgeon: Violeta Gelinas, MD;  Location: [facility] OR;  Service: General;  Laterality: N/A;    Family History  Problem Relation Age of Onset   Breast cancer Mother    Diabetes Father    Hypertension Father    Alcohol  abuse Father    Diabetes Sister    Colon cancer Neg Hx    Pancreatic cancer Neg Hx    Stomach cancer Neg Hx     Social History:  reports that he quit smoking about 11 years ago. His smoking use included cigarettes. He smoked an average of .5 packs  per day. He has never used smokeless tobacco. He reports current alcohol use. He reports that he does not use drugs.  Review of Systems:  Last diabetic eye exam date: Unknown  Last foot exam date: 9/22  Symptoms of neuropathy: None  Hypertension: Not present  BP Readings from Last 3 Encounters:  04/01/23 124/70  01/26/23 (!) 142/78  12/29/22 126/76    Lipids: Treated with 20 mg simvastatin by PCP    Lab Results  Component Value Date   CHOL 122 01/04/2023   CHOL 120 11/30/2021   CHOL 134 12/16/2020   Lab Results  Component Value Date   HDL 35.50 (L) 01/04/2023   HDL 46.00 11/30/2021   HDL 59 12/16/2020   Lab Results  Component Value Date   LDLCALC 74 01/04/2023   LDLCALC 64 11/30/2021   LDLCALC 62 12/16/2020   Lab Results  Component Value Date   TRIG 64.0 01/04/2023   TRIG 51.0 11/30/2021   TRIG 53 12/16/2020   Lab Results  Component Value Date   CHOLHDL 3 01/04/2023   CHOLHDL 3 11/30/2021   CHOLHDL 2.3 12/16/2020   No results found for: "LDLDIRECT"     Examination:   BP 124/70 (BP Location: Left Arm, Patient Position: Sitting, Cuff Size: Large)   Pulse 84   Ht 5' 9.5" (1.765 m)   Wt 225 lb (102.1 kg)   SpO2 96%   BMI 32.75 kg/m   Body mass index is 32.75 kg/m.      ASSESSMENT/ PLAN:    Diabetes type 2 insulin-dependent:   Current regimen: Tresiba 60 units daily and NovoLog before meals   A1c is again lower than expected for his actual blood sugars Last fructosamine 319  Blood sugars were assessed with the Dexcom sensor which indicates a GMI of 8.9  Despite recommendations and reviewing his abnormal blood sugar patterns he has not changed his routine Also doing poorly with diet especially  with eating sweets at night and some soft drinks He is not taking his insulin coverage for lunch and dinner which is causing significant hyperglycemia especially after dinner and overnight Also has not taken Jardiance as directed which was appearing to be improving his glucose   Recommendations:  No change in [place] unless he is waking up with low blood sugars He will likely need to take only 8 units for his breakfast unless eating a big meal He will take 10 units to cover lunch and 15 at dinnertime for average size meals This can be adjusted again based on his blood sugar patterns and to try and keep his blood sugars from being over 180 after meals Will need to take his insulin with him to work when he is eating out at night Cut back on sweets and eliminate regular soft drinks He will benefit from diabetes education but has difficulty finding the time to take off from work [employer] and try to take it with a more complete meal at lunch, continue 25 mg and new prescriptions sent Follow-up in 2 months    Patient Instructions  Take 8 Novolog in am, 12 at lunch and 15 at supper  Jardiance at lunch or supper  Less sweets   Reather Littler 04/01/2023, 9:51 AM   Total visit time for evaluation and management of diabetes and counseling = 30 minutes

## 2023-04-01 NOTE — Patient Instructions (Addendum)
Take 8 Novolog in am, 12 at lunch and 15 at supper  Jardiance at lunch or supper  Less sweets

## 2023-04-06 ENCOUNTER — Telehealth: Payer: Self-pay | Admitting: Radiation Oncology

## 2023-04-06 NOTE — Telephone Encounter (Signed)
Per Billing Specialist Hobgood, pt's secondary insurance is not going to cover what is left after his primary [employer] pays their part. Pt was contacted and advised of this. Pt verbalized understanding and stated he would ask any additional questions on the day of his consult.

## 2023-04-06 NOTE — Telephone Encounter (Signed)
LVM to schedule CON with Dr. Manning.  

## 2023-04-08 ENCOUNTER — Other Ambulatory Visit: Payer: Self-pay | Admitting: Endocrinology

## 2023-04-08 DIAGNOSIS — E1165 Type 2 diabetes mellitus with hyperglycemia: Secondary | ICD-10-CM

## 2023-04-11 ENCOUNTER — Encounter: Payer: Self-pay | Admitting: Radiation Oncology

## 2023-04-11 NOTE — Progress Notes (Signed)
GU Location of Tumor / Histology: Prostate Ca  If Prostate Cancer, Gleason Score is (3 + 4) and PSA is (4.6  on 01/2023)  Biopsies       03/09/2023 Dr. Modena Slater MR Prostate with/without Contrast CLINICAL DATA: 67 year old male with elevated PSA equal 4.55    FINDINGS: Prostate: Two symmetric angular bands of low signal intensity within the peripheral zone on T2 weighted within the LEFT and RIGHT posterior gland (image 16/8).   Mild restricted diffusion associated with the triangular band on the LEFT measuring 9 mm by 5 mm on image 21/6.   No early enhancement in the peripheral zone   The transitional zone is enlarged by capsulated nodules without suspicious imaging characteristics on T2 weighted imaging.   Volume: 5.1 x 4.0 x 4.5 cm.  Volume equal 48 cc   Transcapsular spread:  Absent   Seminal vesicle involvement: Absent   Neurovascular bundle involvement: Absent   Pelvic adenopathy: Enlarged LEFT common iliac lymph node measuring 9 mm (image 4/series 14) is not changed from comparison CT in therefore favored benign.   Bone metastasis: Heterogeneous marrow signal.   Other findings: None   IMPRESSION: 1. Symmetric linear bands of in the posterior LEFT and RIGHT peripheral zone suggest benign scarring. The band on the LEFT does have mild restricted diffusion and therefore indeterminate. PI-RADS: 2. Enlarged nodular transitional zone most consistent with benign prostate hypertrophy. PI-RADS: 2 3. (Dynacad 3D post processing performed). ROI #1.  Past/Anticipated interventions by urology, if any:     Past/Anticipated interventions by medical oncology, if any:  NA  Weight changes, if any: {:18581}  IPSS: SHIM:  Bowel/Bladder complaints, if any: {:18581}   Nausea/Vomiting, if any: {:18581}  Pain issues, if any:  {:18581}  SAFETY ISSUES: Prior radiation? {:18581} Pacemaker/ICD? {:18581} Possible current pregnancy? Male Is the patient on methotrexate?  No  Current Complaints / other details:

## 2023-04-12 ENCOUNTER — Encounter: Payer: Self-pay | Admitting: Urology

## 2023-04-12 DIAGNOSIS — C61 Malignant neoplasm of prostate: Secondary | ICD-10-CM | POA: Insufficient documentation

## 2023-04-12 NOTE — Progress Notes (Signed)
Radiation Oncology         [phone] ________________________________  Initial Outpatient Consultation  Name: Kenneth [name] MRN: [id number]  Date: 04/13/2023  DOB: Jun 15, 1956  CC:Fry, Tera Mater, MD  Crista Elliot, MD   REFERRING PHYSICIAN: Crista Elliot, MD  DIAGNOSIS: 67 y.o. gentleman with Stage T1c adenocarcinoma of the prostate with Gleason score of 3+4, and PSA of 4.55.    ICD-10-CM   1. Malignant neoplasm of prostate  C61       HISTORY OF PRESENT ILLNESS: Kenneth [name] is a 67 y.o. male with a diagnosis of prostate cancer. He has a history of elevated PSA 4.11 in 2021 but this returned to normal at 3.25 in 2023 so he has not previously been evaluated by urology and has not had prior biopsy.  More recently, the PSA was elevated at 5.53 on routine labs with his primary care physician, Dr. Clent Ridges.  Accordingly, he was referred for evaluation in urology by Dr. Alvester Morin on 02/03/23.  A repeat PSA was performed that day and remained elevated at 4.55 so a prostate MRI was performed on 03/09/2023 for further evaluation and showed a PI-RADS 3 lesion in the left peripheral zone but otherwise benign appearance with BPH.  There was a 9 mm left common iliac node that appeared unchanged from prior CT in 2018 and therefore favored benign.  The patient proceeded to MRI fusion transrectal ultrasound with 16 biopsies of the prostate on 03/16/2023.  The prostate volume measured 63 cc.  Out of 16 core biopsies, 7 were positive.  The maximum Gleason score was 3+4, and this was seen in the right base lateral, left mid and left mid lateral.  Additionally, Gleason 3+3 was seen in the left base, left base lateral, left apex lateral and right mid.  The patient reviewed the biopsy results with his urologist and he has kindly been referred today for discussion of potential radiation treatment options.  He is also scheduled for a surgical consult with Dr. Laverle Patter on 04/19/2023.   PREVIOUS RADIATION  THERAPY: {EXAM; YES/NO:19492::"No"}  PAST MEDICAL HISTORY:  Past Medical History:  Diagnosis Date   Alcohol abuse    Angina    Chronic kidney disease    Diabetes mellitus    sees Dr. Romero Belling    Hyperlipidemia    Hypertension       PAST SURGICAL HISTORY: Past Surgical History:  Procedure Laterality Date   COLONOSCOPY  05/06/2014   per Dr. Marina Goodell, clear, repeat in 10 yrs    LAPAROSCOPIC APPENDECTOMY N/A 04/06/2017   Procedure: APPENDECTOMY LAPAROSCOPIC with primary repair of umbilical hernia;  Surgeon: Violeta Gelinas, MD;  Location: [facility];  Service: General;  Laterality: N/A;   PROSTATE BIOPSY      FAMILY HISTORY:  Family History  Problem Relation Age of Onset   Breast cancer Mother    Diabetes Father    Hypertension Father    Alcohol abuse Father    Diabetes Sister    Colon cancer Neg Hx    Pancreatic cancer Neg Hx    Stomach cancer Neg Hx     SOCIAL HISTORY:  Social History   Socioeconomic History   Marital status: Single    Spouse name: Not on file   Number of children: Not on file   Years of education: Not on file   Highest education level: Not on file  Occupational History   Not on file  Tobacco Use   Smoking status: Former  Packs/day: .5    Types: Cigarettes    Quit date: 01/30/2012    Years since quitting: 11.2   Smokeless tobacco: Never  Vaping Use   Vaping Use: Never used  Substance and Sexual Activity   Alcohol use: Yes    Alcohol/week: 0.0 standard drinks of alcohol    Comment: rare   Drug use: No   Sexual activity: Not on file  Other Topics Concern   Not on file  Social History Narrative   Not on file   Social Determinants of Health   Financial Resource Strain: Not on file  Food Insecurity: Not on file  Transportation Needs: Not on file  Physical Activity: Not on file  Stress: Not on file  Social Connections: Not on file  Intimate Partner Violence: Not on file    ALLERGIES: Metformin and related  MEDICATIONS:  Current  Outpatient Medications  Medication Sig Dispense Refill   Continuous Blood Gluc Sensor (DEXCOM G7 SENSOR) MISC CHANGE SENSOR ONCE EVERY 10 DAYS AS DIRECTED 3 each 3   empagliflozin (JARDIANCE) 25 MG TABS tablet Take 1 tablet (25 mg total) by mouth daily before breakfast. 30 tablet 2   glucose blood (ONETOUCH VERIO) test strip 1 each by Other route 2 (two) times daily. And lancets 2/day. 100 each 12   Insulin Degludec FlexTouch 200 UNIT/ML SOPN INJECT 60 UNITS SUBCUTANEOUSLY TO SKIN ONCE DAILY 9 mL 2   naproxen sodium (ANAPROX) 220 MG tablet Take 440 mg by mouth 2 (two) times daily as needed (pain).     NOVOLOG FLEXPEN 100 UNIT/ML FlexPen Take 8 Novolog in am 12 at lunch and 15 at supper 15 mL 0   omeprazole (PRILOSEC) 40 MG capsule Take 1 capsule (40 mg total) by mouth daily. 90 capsule 3   simvastatin (ZOCOR) 20 MG tablet TAKE 1 TABLET BY MOUTH AT BEDTIME 90 tablet 0   tiZANidine (ZANAFLEX) 2 MG tablet Take 1 tablet (2 mg total) by mouth every 8 (eight) hours as needed for muscle spasms. 21 tablet 0   traMADol (ULTRAM) 50 MG tablet Take 2 tablets (100 mg total) by mouth at bedtime. 30 tablet 0   No current facility-administered medications for this visit.    REVIEW OF SYSTEMS:  On review of systems, the patient reports that he is doing well overall. He denies any chest pain, shortness of breath, cough, fevers, chills, night sweats, unintended weight changes. He denies any bowel disturbances, and denies abdominal pain, nausea or vomiting. He denies any new musculoskeletal or joint aches or pains. His IPSS was ***, indicating *** urinary symptoms. His SHIM was ***, indicating he {does not have/has mild/moderate/severe} erectile dysfunction. A complete review of systems is obtained and is otherwise negative.    PHYSICAL EXAM:  Wt Readings from Last 3 Encounters:  04/01/23 225 lb (102.1 kg)  01/26/23 230 lb (104.3 kg)  12/29/22 226 lb (102.5 kg)   Temp Readings from Last 3 Encounters:  01/26/23  99.4 F (37.4 C) (Oral)  12/28/22 98.1 F (36.7 C) (Oral)  12/02/22 98.6 F (37 C) (Oral)   BP Readings from Last 3 Encounters:  04/01/23 124/70  01/26/23 (!) 142/78  12/29/22 126/76   Pulse Readings from Last 3 Encounters:  04/01/23 84  01/26/23 90  12/29/22 72    /10  In general this is a well appearing *** male in no acute distress. He's alert and oriented x4 and appropriate throughout the examination. Cardiopulmonary assessment is negative for acute distress, and he exhibits  normal effort.     KPS = ***  100 - Normal; no complaints; no evidence of disease. 90   - Able to carry on normal activity; minor signs or symptoms of disease. 80   - Normal activity with effort; some signs or symptoms of disease. 70   - Cares for self; unable to carry on normal activity or to do active work. 60   - Requires occasional assistance, but is able to care for most of his personal needs. 50   - Requires considerable assistance and frequent medical care. 40   - Disabled; requires special care and assistance. 30   - Severely disabled; hospital admission is indicated although death not imminent. 20   - Very sick; hospital admission necessary; active supportive treatment necessary. 10   - Moribund; fatal processes progressing rapidly. 0     - Dead  Karnofsky DA, Abelmann WH, Craver LS and Burchenal [facility] [phone]) The use of the nitrogen mustards in the palliative treatment of carcinoma: with particular reference to bronchogenic carcinoma Cancer 1 [phone]  LABORATORY DATA:  Lab Results  Component Value Date   WBC 6.2 01/04/2023   HGB 12.9 (L) 01/04/2023   HCT 38.5 (L) 01/04/2023   MCV 89.5 01/04/2023   PLT 285.0 01/04/2023   Lab Results  Component Value Date   NA 138 03/29/2023   K 4.1 03/29/2023   CL 102 03/29/2023   CO2 28 03/29/2023   Lab Results  Component Value Date   ALT 13 01/04/2023   AST 11 01/04/2023   ALKPHOS 97 01/04/2023   BILITOT 0.5 01/04/2023     RADIOGRAPHY: No  results found.    IMPRESSION/PLAN: 1. 67 y.o. gentleman with Stage T1c adenocarcinoma of the prostate with Gleason Score of 3+4, and PSA of 4.55. We discussed the patient's workup and outlined the nature of prostate cancer in this setting. The patient's T stage, Gleason's score, and PSA put him into the favorable intermediate risk group. Accordingly, he is eligible for a variety of potential treatment options including  brachytherapy, 5.5 weeks of external radiation, or prostatectomy. We discussed the available radiation techniques, and focused on the details and logistics of delivery. The patient may not be an ideal candidate for brachytherapy with a prostate volume of 63 cc. We discussed that based on his prostate volume, he would require beginning treatment with a 5 alpha reductase inhibitor for at least 3 months to allow for downsizing of the prostate prior to initiating brachytherapy.  We discussed and outlined the risks, benefits, short and long-term effects associated with radiotherapy and compared and contrasted these with prostatectomy. We discussed the role of SpaceOAR gel in reducing the rectal toxicity associated with radiotherapy. He appears to have a good understanding of his disease and our treatment recommendations which are of curative intent.  He was encouraged to ask questions that were answered to his stated satisfaction.  At the conclusion of our conversation, the patient is interested in moving forward with ***.  We personally spent *** minutes in this encounter including chart review, reviewing radiological studies, meeting face-to-face with the patient, entering orders and completing documentation.    Marguarite Arbour, PA-C    Margaretmary Dys, MD  [facility]  Radiation Oncology Direct Dial: [phone]  Fax: [phone] [email or web address]  Skype  LinkedIn

## 2023-04-13 ENCOUNTER — Ambulatory Visit
Admission: RE | Admit: 2023-04-13 | Discharge: 2023-04-13 | Disposition: A | Discharge status: Home / Self Care | Payer: BC Managed Care – PPO | Source: Ambulatory Visit | Attending: Radiation Oncology | Admitting: Radiation Oncology

## 2023-04-13 ENCOUNTER — Encounter: Payer: Self-pay | Admitting: Radiation Oncology

## 2023-04-13 ENCOUNTER — Other Ambulatory Visit: Payer: Self-pay

## 2023-04-13 VITALS — BP 139/73 | HR 63 | Temp 98.2°F | Resp 18 | Ht 71.0 in | Wt 225.2 lb

## 2023-04-13 DIAGNOSIS — Z79899 Other long term (current) drug therapy: Secondary | ICD-10-CM | POA: Insufficient documentation

## 2023-04-13 DIAGNOSIS — Z87891 Personal history of nicotine dependence: Secondary | ICD-10-CM | POA: Insufficient documentation

## 2023-04-13 DIAGNOSIS — E119 Type 2 diabetes mellitus without complications: Secondary | ICD-10-CM | POA: Diagnosis not present

## 2023-04-13 DIAGNOSIS — Z7984 Long term (current) use of oral hypoglycemic drugs: Secondary | ICD-10-CM | POA: Diagnosis not present

## 2023-04-13 DIAGNOSIS — N189 Chronic kidney disease, unspecified: Secondary | ICD-10-CM | POA: Insufficient documentation

## 2023-04-13 DIAGNOSIS — C61 Malignant neoplasm of prostate: Secondary | ICD-10-CM

## 2023-04-13 DIAGNOSIS — Z800 Family history of malignant neoplasm of digestive organs: Secondary | ICD-10-CM | POA: Diagnosis not present

## 2023-04-13 DIAGNOSIS — E785 Hyperlipidemia, unspecified: Secondary | ICD-10-CM | POA: Insufficient documentation

## 2023-04-13 DIAGNOSIS — I129 Hypertensive chronic kidney disease with stage 1 through stage 4 chronic kidney disease, or unspecified chronic kidney disease: Secondary | ICD-10-CM | POA: Diagnosis not present

## 2023-04-13 DIAGNOSIS — Z794 Long term (current) use of insulin: Secondary | ICD-10-CM | POA: Insufficient documentation

## 2023-04-13 DIAGNOSIS — Z803 Family history of malignant neoplasm of breast: Secondary | ICD-10-CM | POA: Insufficient documentation

## 2023-04-15 NOTE — Progress Notes (Signed)
Spoke with patient via telephone to introduce myself as the prostate nurse navigator and discussed my role.  No barriers to care identified at this time.  Patient is scheduled for surgical consult on 4/30 and will then finalize treatment decision.  I provided my contact information and asked for him to call me with questions or concerns.  Verbalized understanding.

## 2023-04-21 ENCOUNTER — Telehealth: Payer: Self-pay | Admitting: Radiation Oncology

## 2023-04-21 NOTE — Progress Notes (Signed)
Patient was recent consult on 4/24 for his stage T1c adenocarcinoma of the prostate with Gleason Score of 3+4, and PSA of 4.55 and wanted to proceed with surgical consult on 4/30 with Dr. Laverle Patter.   Patient has confirmed that he would like to proceed with 5.5 weeks of external radiation.   MD's notified, plan of care in progress.

## 2023-04-21 NOTE — Telephone Encounter (Signed)
5/2 @ 4:09 pm patient left voicemail for someone to call him to set up his treatments.  He is ready to start.  Email forward to [employer] and [facility], so they are aware.

## 2023-04-22 ENCOUNTER — Other Ambulatory Visit: Payer: Self-pay | Admitting: Urology

## 2023-05-03 ENCOUNTER — Other Ambulatory Visit: Payer: Self-pay | Admitting: Endocrinology

## 2023-05-30 ENCOUNTER — Encounter (HOSPITAL_BASED_OUTPATIENT_CLINIC_OR_DEPARTMENT_OTHER): Payer: Self-pay | Admitting: Urology

## 2023-05-30 ENCOUNTER — Other Ambulatory Visit: Payer: Self-pay

## 2023-05-30 NOTE — Progress Notes (Signed)
Spoke w/ via phone for pre-op interview---pt Lab needs dos----      EKG, I stat         Lab results------none COVID test -----patient states asymptomatic no test needed Arrive at -------530 am 06-16-2023 NPO after MN NO Solid Food.  Clear liquids from MN until---430 am Med rec completed Medications to take morning of surgery -----omeprazole Diabetic medication -----none day of surgery Patient instructed no nail polish to be worn day of surgery Patient instructed to bring photo id and insurance card day of surgery Patient aware to have Driver (ride ) / caregiver  [name]   for 24 hours after surgery  Patient Special Instructions -----fleets enema qhs night before surgery Pre-Op special Instructions -----none Patient verbalized understanding of instructions that were given at this phone interview. Patient denies shortness of breath, chest pain, fever, cough at this phone interview.

## 2023-06-02 ENCOUNTER — Ambulatory Visit (INDEPENDENT_AMBULATORY_CARE_PROVIDER_SITE_OTHER): Payer: BC Managed Care – PPO | Admitting: Endocrinology

## 2023-06-02 VITALS — BP 136/68 | HR 71 | Ht 71.0 in | Wt 225.8 lb

## 2023-06-02 DIAGNOSIS — E1165 Type 2 diabetes mellitus with hyperglycemia: Secondary | ICD-10-CM

## 2023-06-02 DIAGNOSIS — Z794 Long term (current) use of insulin: Secondary | ICD-10-CM | POA: Diagnosis not present

## 2023-06-02 DIAGNOSIS — Z6832 Body mass index (BMI) 32.0-32.9, adult: Secondary | ICD-10-CM

## 2023-06-02 NOTE — Patient Instructions (Addendum)
  He will take 8-10 units Novolog to cover lunch and ; if not active take 12 units  Take 10-12 units at dinnertime for average size meals TRESIBA 54 UNITS

## 2023-06-02 NOTE — Progress Notes (Signed)
Patient ID: Kenneth [name], male   DOB: [birth year], 67 y.o.   MRN: [id number]            Reason for Appointment: Diabetes follow-up   History of Present Illness   Diagnosis date: 2008  Previous history:  Apparently initially diagnosed with diabetic ketoacidosis Again had ketoacidosis in 2011 Although he may have been on basal bolus insulin regimen in the past he has been only on Lantus since about 2015  Oral hypoglycemic drugs previously given: Metformin Insulin was started soon after diagnosis  A1c range in the last few years is: 4.9-14  Recent history:     Non-insulin hypoglycemic drugs: Jardiance, 25 mg daily     Insulin regimen: Tresiba 60 units daily  NovoLog 6 units at breakfast, 14 at lunch and 10 at dinnertime    Side effects from medications: GI side effects from metformin  Current self management, blood sugar patterns and problems identified:  A1c is last 7.3  His blood sugars are much better compared to his last visit in April He says that he has tried to improve his diet with cutting back on sweets and high fat sandwiches  Although he was told to take 10 units coverage at lunch he is taking 14 units Especially when he is at work and active his blood sugars are frequently getting low midday Also he takes his insulin up to 20-minute before he is eating lunch before leaving home Even though his thinks he is eating a small meal in the evening his blood sugars are progressively higher with eating possibly from decreased activity  Although overnight sugars are generally fairly good he is blood sugars were low early morning today Today he took his insulin before he came here and did not eat and his blood sugar was low Using his Dexcom fairly consistently He has gone back to his Jardiance also and no nausea from this now  MEALTIMES: Breakfast usually 4 AM, lunch 11:30 AM and dinner 7:30 PM  Exercise: He 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  No hypoglycemia overnight except last night and occasionally early morning  Blood sugars are the lowest between 12 PM-2 PM including episodes of hypoglycemia fairly frequently each week HIGHEST blood sugars are around 7-9 PM with some variability and occasionally as high as 400  POSTPRANDIAL readings are not rising significantly after breakfast After his midmorning snack around 8 AM blood sugars are usually rising modestly Blood sugars around lunchtime between about 12 noon-2 PM generally low normal or low with periodic hypoglycemia His blood sugars are progressively higher in the early evenings and postprandial spikes are more prominent in the first week compared to the second week with rising blood sugars after about 9 PM frequently Time in range is markedly improved compared to his last visit at 80%  Data for the last 2 weeks   CGM use % of time   2-week average/GV 138/34  Time in range       79%  % Time Above 180 16+2  % Time above 250   % Time Below 70 3     PRE-MEAL Fasting Midday Dinner Bedtime Overall  Glucose range:       Averages: 128 85  POST-MEAL PC Breakfast PC Lunch PC Dinner  Glucose range:   176  Averages:      Previously:   CGM use % of time   2-week average/GV 234  Time in range  34      %  % Time Above 180 24  % Time above 250 41  % Time Below 70     Dietician visit: Most recent: 2020  Weight control:  Wt Readings from Last 3 Encounters:  06/02/23 225 lb 12.8 oz (102.4 kg)  04/13/23 225 lb 3.2 oz (102.2 kg)  04/01/23 225 lb (102.1 kg)            Diabetes labs:  Lab Results  Component Value Date   HGBA1C 7.3 (H) 03/29/2023   HGBA1C 7.0 (H) 12/27/2022   HGBA1C 8.2 (H) 06/03/2022   Lab Results  Component Value Date    MICROALBUR 2.6 (H) 06/03/2022   LDLCALC 74 01/04/2023   CREATININE 0.97 03/29/2023   Lab Results  Component Value Date   FRUCTOSAMINE 319 (H) 12/27/2022   FRUCTOSAMINE 391 (H) 11/03/2021   FRUCTOSAMINE 230 11/19/2019     Allergies as of 06/02/2023       Reactions   Metformin And Related Nausea And Vomiting        Medication List        Accurate as of June 02, 2023  2:47 PM. If you have any questions, ask your nurse or doctor.          acetaminophen 500 MG tablet Commonly known as: TYLENOL Take 1,000 mg by mouth every 6 (six) hours as needed.   Dexcom G7 Sensor Misc CHANGE SENSOR EVERY 10 DAYS AS DIRECTED   empagliflozin 25 MG Tabs tablet Commonly known as: Jardiance Take 1 tablet (25 mg total) by mouth daily before breakfast.   Insulin Degludec FlexTouch 200 UNIT/ML Sopn INJECT 60 UNITS SUBCUTANEOUSLY TO SKIN ONCE DAILY   NovoLOG FlexPen 100 UNIT/ML FlexPen Generic drug: insulin aspart Take 8 Novolog in am 12 at lunch and 15 at supper What changed: additional instructions   omeprazole 40 MG capsule Commonly known as: PRILOSEC Take 1 capsule (40 mg total) by mouth daily.   OneTouch Verio test strip Generic drug: glucose blood 1 each by Other route 2 (two) times daily. And lancets 2/day.   simvastatin 20 MG tablet Commonly known as: ZOCOR TAKE 1 TABLET BY MOUTH AT BEDTIME        Allergies:  Allergies  Allergen Reactions   Metformin And Related Nausea And Vomiting    Past Medical History:  Diagnosis Date   Diabetes mellitus type 2    sees Dr. Romero Belling    Hyperlipidemia    Prostate cancer [facility])     Past Surgical History:  Procedure Laterality Date   COLONOSCOPY  05/06/2014   per Dr. Marina Goodell, clear, repeat in 10 yrs    LAPAROSCOPIC APPENDECTOMY N/A 04/06/2017   Procedure: APPENDECTOMY LAPAROSCOPIC with primary repair of umbilical hernia;  Surgeon: Violeta Gelinas, MD;  Location: [facility] OR;  Service: General;  Laterality: N/A;   PROSTATE  BIOPSY      Family History  Problem Relation Age of Onset   Breast cancer Mother    Diabetes Father    Hypertension Father    Alcohol abuse Father    Diabetes Sister    Colon cancer Neg Hx    Pancreatic cancer Neg Hx    Stomach cancer Neg Hx     Social History:  reports that he  quit smoking about 11 years ago. His smoking use included cigarettes. He smoked an average of .5 packs per day. He has never used smokeless tobacco. He reports that he does not currently use alcohol. He reports that he does not use drugs.  Review of Systems:  Last diabetic eye exam date: Unknown  Last foot exam date: 05/2023  Symptoms of neuropathy: None  Last urine microalbumin: 05/2022  Hypertension: Not present  BP Readings from Last 3 Encounters:  06/02/23 136/68  04/13/23 139/73  04/01/23 124/70    Lipids: Treated with 20 mg simvastatin by PCP    Lab Results  Component Value Date   CHOL 122 01/04/2023   CHOL 120 11/30/2021   CHOL 134 12/16/2020   Lab Results  Component Value Date   HDL 35.50 (L) 01/04/2023   HDL 46.00 11/30/2021   HDL 59 12/16/2020   Lab Results  Component Value Date   LDLCALC 74 01/04/2023   LDLCALC 64 11/30/2021   LDLCALC 62 12/16/2020   Lab Results  Component Value Date   TRIG 64.0 01/04/2023   TRIG 51.0 11/30/2021   TRIG 53 12/16/2020   Lab Results  Component Value Date   CHOLHDL 3 01/04/2023   CHOLHDL 3 11/30/2021   CHOLHDL 2.3 12/16/2020   No results found for: "LDLDIRECT"     Examination:   BP 136/68 (BP Location: Left Arm, Patient Position: Sitting, Cuff Size: Large)   Pulse 71   Ht 5\' 11"  (1.803 m)   Wt 225 lb 12.8 oz (102.4 kg)   SpO2 96%   BMI 31.49 kg/m   Body mass index is 31.49 kg/m.     Diabetic Foot Exam - Simple   Simple Foot Form Diabetic Foot exam was performed with the following findings: Yes   Visual Inspection No deformities, no ulcerations, no other skin breakdown bilaterally: Yes Sensation Testing Intact to  touch and monofilament testing bilaterally: Yes Pulse Check Posterior Tibialis and Dorsalis pulse intact bilaterally: Yes Comments    No pedal edema  ASSESSMENT/ PLAN:    Diabetes type 2 insulin-dependent:   Current regimen: Tresiba 60 units daily and NovoLog before meals, Jardiance 25 mg daily  A1c is last 7.3 but lower than expected for blood sugars and fructosamine is more accurate  Blood sugars were assessed with the Dexcom sensor which for the last 2 weeks shows a GMI of 6.6 compared to 8.9 on his last visit  He has finally started paying attention to his diabetes management better as well as diet He is increasing his insulin at mealtimes but probably because of his heavy physical activity after lunch his blood sugars are frequently getting low Also has not following the recommended timing for the NovoLog insulin Now his blood sugars are also starting to get low in the mornings  Not clear why blood sugars are rising later in the evenings but likely related to being less active Likely benefiting from restarting Jardiance   Recommendations:  Continue to improve diet and have low-fat meals along with limited carbohydrate and fat intake Also on weekends instead of fried fish she can try baked fish He will reduce his Kenneth [name] empirically to 54 units to avoid low sugars early morning and before meals Continue to take 6 units for breakfast unless eating a larger meal on weekends He will take 10 units to cover lunch at the most unless he is planning to be less active and on the weekend meals but no more than 15  Take 10  to 12 units to cover suppertime 1 intake and adjust based on carbohydrate intake Continue Jardiance Will need follow-up fructosamine Follow-up in 2 months Recheck urine microalbumin on the next visit    Patient Instructions   He will take 8-10 units Novolog to cover lunch and ; if not active take 12 units  Take 10-12 units at dinnertime for average size  meals TRESIBA 54 UNITS    Kenneth [name] 06/02/2023, 2:47 PM   Total visit time for evaluation and management of diabetes and counseling = 30 minutes

## 2023-06-08 ENCOUNTER — Encounter: Payer: Self-pay | Admitting: Endocrinology

## 2023-06-12 ENCOUNTER — Other Ambulatory Visit: Payer: Self-pay | Admitting: Family Medicine

## 2023-06-15 NOTE — H&P (Signed)
CC/HPI: CC: Prostate Cancer    Kenneth [name] is a 67 year old gentleman who was noted to have an elevated PSA of 4.55. He proceeded with an MRI of the prostate on 03/09/23 that demonstrated a 9 mm left sided PI-RADS 3 lesion. An MR/US fusion biopsy was then performed on 03/16/23 that demonstrated Gleason 3+4=7 adenocarcinoma with 7 out of 12 systematic biopsies positive and all 4 targeted biopsies negative for malignancy.   Family history: None.   Imaging studies: MRI (03/09/23) - No EPE, SVI, LAD, or bone lesions.   PMH: He has a history of GERD, hypertension, diabetes, and hyperlipidemia.  PSH: L/S appendectomy. Possible umbilical hernia repair with mesh.   TNM stage: cT1c N0 Mx  PSA: 4.55  Gleason score: 3+4=7 0GG 2)  Biopsy (03/16/23): 7/16 cores positive  Left: L lateral apex (5%, 3+3=6), L lateral mid (37%, 3+4=7), L mid (37%, 3+4=7), L lateral base (34%, 3+3=6), L base (29%, 3+3=6)  Right: R mid (13%, 3+3=6), R lateral base (21%, 3+4=7)  Prostate volume: 62.7 cc   Nomogram  OC disease: 56%  EPE: 43%  SVI: 5%  LNI: 5%  PFS (5 year, 10 year): 82%, 71%   Urinary function: IPSS is 2.  Erectile function: SHIM score is 20.     ALLERGIES: Metformin    MEDICATIONS: Prilosec  Simvastatin  Acetaminophen  Aleve  Dexcom G6 Sensor  Jardiance  Novolog  Tizanidine Hcl  Tramadol Hcl  Tresiba  Tylenol     GU PSH: Prostate Needle Biopsy - 03/16/2023     NON-GU PSH: Appendectomy (laparoscopic) Surgical Pathology, Gross And Microscopic Examination For Prostate Needle - 03/16/2023     GU PMH: Prostate Cancer - 04/04/2023 Elevated PSA - 03/16/2023, - 02/03/2023 BPH w/o LUTS - 02/03/2023 Encounter for Prostate Cancer screening - 02/03/2023    NON-GU PMH: Diabetes Type 2 GERD Hypercholesterolemia Hypertension    FAMILY HISTORY: 4 daughters - Other   SOCIAL HISTORY: Marital Status: Married Preferred Language: English; Race: Black or African American Current Smoking Status:  Patient does not smoke anymore. Has not smoked since 01/20/2021.   Tobacco Use Assessment Completed: Used Tobacco in last 30 days? Drinks 3 caffeinated drinks per day.    REVIEW OF SYSTEMS:    GU Review Male:   Patient denies frequent urination, hard to postpone urination, burning/ pain with urination, get up at night to urinate, leakage of urine, stream starts and stops, trouble starting your streams, and have to strain to urinate .  Gastrointestinal (Upper):   Patient denies nausea and vomiting.  Gastrointestinal (Lower):   Patient denies diarrhea and constipation.  Constitutional:   Patient denies fever, night sweats, weight loss, and fatigue.  Skin:   Patient denies skin rash/ lesion and itching.  Eyes:   Patient denies blurred vision and double vision.  Ears/ Nose/ Throat:   Patient denies sore throat and sinus problems.  Hematologic/Lymphatic:   Patient denies swollen glands and easy bruising.  Cardiovascular:   Patient denies leg swelling and chest pains.  Respiratory:   Patient denies cough and shortness of breath.  Endocrine:   Patient denies excessive thirst.  Musculoskeletal:   Patient denies back pain and joint pain.  Neurological:   Patient denies headaches and dizziness.  Psychologic:   Patient denies depression and anxiety.   VITAL SIGNS:     Weight 225 lb / 102.06 kg   BMI 31.4 kg/m   MULTI-SYSTEM PHYSICAL EXAMINATION:    Constitutional: Well-nourished. No physical deformities. Normally developed.  Good grooming.     Complexity of Data:  Lab Test Review:   PSA  Records Review:   Pathology Reports, Previous Patient Records   02/03/23  PSA  Total PSA 4.55 ng/mL       ASSESSMENT:      ICD-10 Details  1 GU:   Prostate Cancer - C61    PLAN:      1. Prostate cancer: I did detailed discussion with Kenneth [name] and his wife today. There very informed about his prostate cancer situation considering her prior discussions with Kenneth [name] and Kenneth [name]. The patient was  counseled about the natural history of prostate cancer and the standard treatment options that are available for prostate cancer. It was explained to him how his age and life expectancy, clinical stage, Gleason score/prognostic grade group, and PSA (and PSA density) affect his prognosis, the decision to proceed with additional staging studies, as well as how that information influences recommended treatment strategies. We discussed the roles for active surveillance, radiation therapy, surgical therapy, androgen deprivation, as well as ablative therapy and other investigational options for the treatment of prostate cancer as appropriate to his individual cancer situation. We discussed the risks and benefits of these options with regard to their impact on cancer control and also in terms of potential adverse events, complications, and impact on quality of life particularly related to urinary and sexual function. The patient was encouraged to ask questions throughout the discussion today and all questions were answered to his stated satisfaction. In addition, the patient was provided with and/or directed to appropriate resources and literature for further education about prostate cancer and treatment options. We discussed surgical therapy for prostate cancer including the different available surgical approaches. We discussed, in detail, the risks and expectations of surgery with regard to cancer control, urinary control, and erectile function as well as the expected postoperative recovery process. Additional risks of surgery including but not limited to bleeding, infection, hernia formation, nerve damage, lymphocele formation, bowel/rectal injury potentially necessitating colostomy, damage to the urinary tract resulting in urine leakage, urethral stricture, and the cardiopulmonary risks such as myocardial infarction, stroke, death, venothromboembolism, etc. were explained. The risk of open surgical conversion for  robotic/laparoscopic prostatectomy was also discussed.   At this time, he remains undecided about his decision for therapy. My tentative plan for surgical therapy would be to perform a bilateral nerve sparing robot-assisted laparoscopic radical prostatectomy and bilateral pelvic lymphadenectomy. He did defer a physical exam today and I would have him return preoperatively for an exam prior to surgery if he chose to pursue that option.      Cell Phone: [phone]  Regarding: Final decision Discussion Occurred With: Patient Kenneth [name])  Message: Patient met with Dr. Laverle Patter and discussed robotic assisted laparoscopic prostatectomy as well as meeting with Kenneth [name] to discuss radiation options. He has ultimately decided for 5.5 weeks of external beam radiation therapy. We recommend proceeding with judicial marker placement and SpaceOAR in the operating room.

## 2023-06-16 ENCOUNTER — Encounter (HOSPITAL_BASED_OUTPATIENT_CLINIC_OR_DEPARTMENT_OTHER)
Admission: RE | Disposition: A | Discharge status: Home / Self Care | Payer: Self-pay | Source: Home / Self Care | Attending: Urology

## 2023-06-16 ENCOUNTER — Encounter (HOSPITAL_BASED_OUTPATIENT_CLINIC_OR_DEPARTMENT_OTHER): Payer: Self-pay | Admitting: Urology

## 2023-06-16 ENCOUNTER — Ambulatory Visit (HOSPITAL_BASED_OUTPATIENT_CLINIC_OR_DEPARTMENT_OTHER): Payer: BC Managed Care – PPO | Admitting: Certified Registered"

## 2023-06-16 ENCOUNTER — Ambulatory Visit (HOSPITAL_BASED_OUTPATIENT_CLINIC_OR_DEPARTMENT_OTHER)
Admission: RE | Admit: 2023-06-16 | Discharge: 2023-06-16 | Disposition: A | Discharge status: Home / Self Care | Payer: BC Managed Care – PPO | Attending: Urology | Admitting: Urology

## 2023-06-16 DIAGNOSIS — Z794 Long term (current) use of insulin: Secondary | ICD-10-CM | POA: Diagnosis not present

## 2023-06-16 DIAGNOSIS — I10 Essential (primary) hypertension: Secondary | ICD-10-CM | POA: Diagnosis not present

## 2023-06-16 DIAGNOSIS — Z01818 Encounter for other preprocedural examination: Secondary | ICD-10-CM

## 2023-06-16 DIAGNOSIS — C61 Malignant neoplasm of prostate: Secondary | ICD-10-CM | POA: Insufficient documentation

## 2023-06-16 DIAGNOSIS — Z7984 Long term (current) use of oral hypoglycemic drugs: Secondary | ICD-10-CM | POA: Insufficient documentation

## 2023-06-16 DIAGNOSIS — Z87891 Personal history of nicotine dependence: Secondary | ICD-10-CM | POA: Insufficient documentation

## 2023-06-16 DIAGNOSIS — E119 Type 2 diabetes mellitus without complications: Secondary | ICD-10-CM | POA: Diagnosis not present

## 2023-06-16 DIAGNOSIS — K219 Gastro-esophageal reflux disease without esophagitis: Secondary | ICD-10-CM | POA: Insufficient documentation

## 2023-06-16 HISTORY — PX: GOLD SEED IMPLANT: SHX6343

## 2023-06-16 HISTORY — PX: SPACE OAR INSTILLATION: SHX6769

## 2023-06-16 HISTORY — DX: Malignant neoplasm of prostate: C61

## 2023-06-16 LAB — POCT I-STAT, CHEM 8
BUN: 18 mg/dL (ref 8–23)
Calcium, Ion: 1.29 mmol/L (ref 1.15–1.40)
Chloride: 104 mmol/L (ref 98–111)
Creatinine, Ser: 1.1 mg/dL (ref 0.61–1.24)
Glucose, Bld: 117 mg/dL — ABNORMAL HIGH (ref 70–99)
HCT: 40 % (ref 39.0–52.0)
Hemoglobin: 13.6 g/dL (ref 13.0–17.0)
Potassium: 4.2 mmol/L (ref 3.5–5.1)
Sodium: 140 mmol/L (ref 135–145)
TCO2: 26 mmol/L (ref 22–32)

## 2023-06-16 LAB — GLUCOSE, CAPILLARY: Glucose-Capillary: 105 mg/dL — ABNORMAL HIGH (ref 70–99)

## 2023-06-16 SURGERY — INSERTION, GOLD SEEDS
Anesthesia: Monitor Anesthesia Care | Site: Prostate

## 2023-06-16 MED ORDER — DEXAMETHASONE SODIUM PHOSPHATE 10 MG/ML IJ SOLN
INTRAMUSCULAR | Status: DC | PRN
  Administered 2023-06-16: 10 mg via INTRAVENOUS

## 2023-06-16 MED ORDER — AMISULPRIDE (ANTIEMETIC) 5 MG/2ML IV SOLN: 10.0000 mg | Freq: Once | INTRAVENOUS | Status: DC | PRN

## 2023-06-16 MED ORDER — DEXMEDETOMIDINE HCL IN NACL 200 MCG/50ML IV SOLN
INTRAVENOUS | Status: DC | PRN
  Administered 2023-06-16 (×3): 4 ug via INTRAVENOUS

## 2023-06-16 MED ORDER — MIDAZOLAM HCL 5 MG/5ML IJ SOLN
INTRAMUSCULAR | Status: DC | PRN
  Administered 2023-06-16: 2 mg via INTRAVENOUS

## 2023-06-16 MED ORDER — PROPOFOL 500 MG/50ML IV EMUL
INTRAVENOUS | Status: DC | PRN
  Administered 2023-06-16: 150 ug/kg/min via INTRAVENOUS

## 2023-06-16 MED ORDER — OXYCODONE HCL 5 MG PO TABS: 5.0000 mg | ORAL_TABLET | Freq: Once | ORAL | Status: DC | PRN

## 2023-06-16 MED ORDER — LACTATED RINGERS IV SOLN: INTRAVENOUS | Status: DC

## 2023-06-16 MED ORDER — DEXMEDETOMIDINE HCL IN NACL 80 MCG/20ML IV SOLN
INTRAVENOUS | Status: AC
  Filled 2023-06-16: qty 20

## 2023-06-16 MED ORDER — CEFAZOLIN SODIUM-DEXTROSE 2-4 GM/100ML-% IV SOLN
2.0000 g | INTRAVENOUS | Status: AC
  Administered 2023-06-16: 2 g via INTRAVENOUS

## 2023-06-16 MED ORDER — FENTANYL CITRATE (PF) 100 MCG/2ML IJ SOLN
INTRAMUSCULAR | Status: AC
  Filled 2023-06-16: qty 2

## 2023-06-16 MED ORDER — ACETAMINOPHEN 500 MG PO TABS: 1000.0000 mg | ORAL_TABLET | Freq: Once | ORAL | Status: DC

## 2023-06-16 MED ORDER — OXYCODONE HCL 5 MG/5ML PO SOLN: 5.0000 mg | Freq: Once | ORAL | Status: DC | PRN

## 2023-06-16 MED ORDER — FENTANYL CITRATE (PF) 100 MCG/2ML IJ SOLN: 25.0000 ug | INTRAMUSCULAR | Status: DC | PRN

## 2023-06-16 MED ORDER — PROPOFOL 500 MG/50ML IV EMUL
INTRAVENOUS | Status: AC
  Filled 2023-06-16: qty 50

## 2023-06-16 MED ORDER — MIDAZOLAM HCL 2 MG/2ML IJ SOLN
INTRAMUSCULAR | Status: AC
  Filled 2023-06-16: qty 2

## 2023-06-16 MED ORDER — ACETAMINOPHEN 160 MG/5ML PO SOLN: 325.0000 mg | ORAL | Status: DC | PRN

## 2023-06-16 MED ORDER — CEFAZOLIN SODIUM-DEXTROSE 2-4 GM/100ML-% IV SOLN
INTRAVENOUS | Status: AC
  Filled 2023-06-16: qty 100

## 2023-06-16 MED ORDER — ONDANSETRON HCL 4 MG/2ML IJ SOLN
INTRAMUSCULAR | Status: DC | PRN
  Administered 2023-06-16: 4 mg via INTRAVENOUS

## 2023-06-16 MED ORDER — DEXAMETHASONE SODIUM PHOSPHATE 10 MG/ML IJ SOLN
INTRAMUSCULAR | Status: AC
  Filled 2023-06-16: qty 1

## 2023-06-16 MED ORDER — SODIUM CHLORIDE (PF) 0.9 % IJ SOLN
INTRAMUSCULAR | Status: DC | PRN
  Administered 2023-06-16: 10 mL

## 2023-06-16 MED ORDER — PROMETHAZINE HCL 25 MG/ML IJ SOLN: 6.2500 mg | INTRAMUSCULAR | Status: DC | PRN

## 2023-06-16 MED ORDER — FLEET ENEMA 7-19 GM/118ML RE ENEM: 1.0000 | ENEMA | Freq: Once | RECTAL | Status: DC

## 2023-06-16 MED ORDER — PROPOFOL 10 MG/ML IV BOLUS
INTRAVENOUS | Status: DC | PRN
  Administered 2023-06-16: 20 mg via INTRAVENOUS

## 2023-06-16 MED ORDER — ACETAMINOPHEN 10 MG/ML IV SOLN: 1000.0000 mg | Freq: Once | INTRAVENOUS | Status: DC | PRN

## 2023-06-16 MED ORDER — ONDANSETRON HCL 4 MG/2ML IJ SOLN
INTRAMUSCULAR | Status: AC
  Filled 2023-06-16: qty 2

## 2023-06-16 MED ORDER — BUPIVACAINE HCL 0.25 % IJ SOLN
INTRAMUSCULAR | Status: DC | PRN
  Administered 2023-06-16: 10 mL

## 2023-06-16 MED ORDER — ACETAMINOPHEN 325 MG PO TABS: 325.0000 mg | ORAL_TABLET | ORAL | Status: DC | PRN

## 2023-06-16 SURGICAL SUPPLY — 27 items
BLADE CLIPPER SENSICLIP SURGIC (BLADE) ×1 IMPLANT
CNTNR URN SCR LID CUP LEK RST (MISCELLANEOUS) ×1 IMPLANT
CONT SPEC 4OZ STRL OR WHT (MISCELLANEOUS) ×1
COVER BACK TABLE 60X90IN (DRAPES) ×1 IMPLANT
DRAPE C-ARM 35X43 STRL (DRAPES) ×1 IMPLANT
DRSG TEGADERM 4X4.75 (GAUZE/BANDAGES/DRESSINGS) ×1 IMPLANT
DRSG TEGADERM 8X12 (GAUZE/BANDAGES/DRESSINGS) ×1 IMPLANT
GAUZE SPONGE 4X4 12PLY STRL (GAUZE/BANDAGES/DRESSINGS) ×1 IMPLANT
GLOVE BIO SURGEON STRL SZ7.5 (GLOVE) ×1 IMPLANT
GLOVE SURG ORTHO 8.5 STRL (GLOVE) ×1 IMPLANT
GLOVE SURG SS PI 6.5 STRL IVOR (GLOVE) IMPLANT
IMPL SPACEOAR VUE SYSTEM (Spacer) ×1 IMPLANT
IMPLANT SPACEOAR VUE SYSTEM (Spacer) ×1 IMPLANT
KIT TURNOVER CYSTO (KITS) ×1 IMPLANT
MARKER GOLD PRELOAD 1.2X3 (Urological Implant) ×1 IMPLANT
MARKER SKIN DUAL TIP RULER LAB (MISCELLANEOUS) ×1 IMPLANT
NDL SPNL 22GX3.5 QUINCKE BK (NEEDLE) ×1 IMPLANT
NEEDLE SPNL 22GX3.5 QUINCKE BK (NEEDLE) ×1 IMPLANT
SEED GOLD PRELOAD 1.2X3 (Urological Implant) ×2 IMPLANT
SHEATH ULTRASOUND LF (SHEATH) IMPLANT
SHEATH ULTRASOUND LTX NONSTRL (SHEATH) IMPLANT
SLEEVE SCD COMPRESS KNEE MED (STOCKING) ×1 IMPLANT
SURGILUBE 2OZ TUBE FLIPTOP (MISCELLANEOUS) ×1 IMPLANT
SYR 10ML LL (SYRINGE) ×1 IMPLANT
SYR CONTROL 10ML LL (SYRINGE) ×1 IMPLANT
TOWEL OR 17X24 6PK STRL BLUE (TOWEL DISPOSABLE) ×1 IMPLANT
UNDERPAD 30X36 HEAVY ABSORB (UNDERPADS AND DIAPERS) ×1 IMPLANT

## 2023-06-16 NOTE — Op Note (Signed)
Preoperative diagnosis: Prostate cancer   Postoperative diagnosis: Prostate cancer   Procedure:  1) Fiducial marker placement into the prostate 2) Insertion of SpaceOAR hydrogel    Surgeon: Moody Bruins. M.D.   Anesthesia: IV sedation, Local anesthesia   EBL: Minimal   Complications: None   Indication: The potential risks, complications, alternative options, and expected recovery course associated with the above procedure(s) have been discussed in detail with the patient and he has provided informed consent to proceed.   Description of procedure: The patient was administered preoperative antibiotics, placed in the dorsal lithotomy position, and prepped and draped in the usual sterile fashion. A preoperative time out was performed.  Next, transrectal ultrasonography was utilized to visualize the prostate. 10 cc of 0.25% bupivacaine was then used to infiltrate the subcuateous tissue of the perineum and an additional 10 cc was injected into the lateral apical tissue surrounding the prostate for a periprostatic nerve block.  Three gold fiducial markers were then placed into the prostate via transperineal needles under ultrasound guidance at the right apex, right base, and left mid gland under direct ultrasound guidance.  After further ultrasound examination, it appeared that the initial set of markers were placed into what was likely a more superficial lipoma (the patients has numerous lipomas over his body) which was rounded and homogenous in a similar shape to the prostate).  As such placement was repeated into the prostate which was identified more proximally.  A site in the midline was then selected on the perineum for placement of an 18 g needle with saline.  The needle was advanced above the rectum and below Denonvillier's fascia to the mid gland and confirmed to be in the midline on transverse imaging.  One cc of saline was injected confirming appropriate expansion of this space.  A  total of 5-10 cc of saline was then injected to open the space further bilaterally.  The saline syringe was then removed and the SpaceOAR hydrogel was injected with good distribution bilaterally. He tolerated the procedure well and without complications. He was able to be awakened and transferred to the PACU in stable condition.

## 2023-06-16 NOTE — Transfer of Care (Signed)
Immediate Anesthesia Transfer of Care Note  Patient: Kenneth [name]  Procedure(s) Performed: GOLD SEED IMPLANT (Prostate) SPACE OAR INSTILLATION (Prostate)  Patient Location: PACU  Anesthesia Type:MAC  Level of Consciousness: awake, alert , and oriented  Airway & Oxygen Therapy: Patient Spontanous Breathing and Patient connected to face mask oxygen  Post-op Assessment: Report given to RN and Post -op Vital signs reviewed and stable  Post vital signs: Reviewed and stable  Last Vitals:  Vitals Value Taken Time  BP 100/68 06/16/23 0748  Temp    Pulse 62 06/16/23 0750  Resp 16 06/16/23 0750  SpO2 100 % 06/16/23 0750  Vitals shown include unvalidated device data.  Last Pain:  Vitals:   06/16/23 0540  TempSrc: Oral  PainSc: 0-No pain      Patients Stated Pain Goal: 1 (06/16/23 0540)  Complications: No notable events documented.

## 2023-06-16 NOTE — Anesthesia Postprocedure Evaluation (Signed)
Anesthesia Post Note  Patient: Kenneth [name]  Procedure(s) Performed: GOLD SEED IMPLANT (Prostate) SPACE OAR INSTILLATION (Prostate)     Patient location during evaluation: PACU Anesthesia Type: MAC Level of consciousness: awake and alert Pain management: pain level controlled Vital Signs Assessment: post-procedure vital signs reviewed and stable Respiratory status: spontaneous breathing, nonlabored ventilation, respiratory function stable and patient connected to nasal cannula oxygen Cardiovascular status: stable and blood pressure returned to baseline Postop Assessment: no apparent nausea or vomiting Anesthetic complications: no  No notable events documented.  Last Vitals:  Vitals:   06/16/23 0845 06/16/23 0900  BP: 126/82 114/77  Pulse: 61   Resp: 14 16  Temp:  (!) 36.4 C  SpO2: 96% 98%    Last Pain:  Vitals:   06/16/23 0900  TempSrc: Oral  PainSc: 0-No pain                 Kenneth [name]

## 2023-06-16 NOTE — Discharge Instructions (Addendum)
You should avoid strenuous activities today but may resume all normal activities tomorrow.  2.   You can take Tylenol as needed for any pain or discomfort.  3.    Follow up with your radiation oncologist for your simulation appointment as scheduled.  If this is not currently scheduled or you do not know the date/time for that appointment, please contact the radiation oncology office to confirm.    Post Anesthesia Home Care Instructions  Activity: Get plenty of rest for the remainder of the day. A responsible individual must stay with you for 24 hours following the procedure.  For the next 24 hours, DO NOT: -Drive a car -Advertising copywriter -Drink alcoholic beverages -Take any medication unless instructed by your physician -Make any legal decisions or sign important papers.  Meals: Start with liquid foods such as gelatin or soup. Progress to regular foods as tolerated. Avoid greasy, spicy, heavy foods. If nausea and/or vomiting occur, drink only clear liquids until the nausea and/or vomiting subsides. Call your physician if vomiting continues.  Special Instructions/Symptoms: Your throat may feel dry or sore from the anesthesia or the breathing tube placed in your throat during surgery. If this causes discomfort, gargle with warm salt water. The discomfort should disappear within 24 hours.  Call your surgeon if you experience:   1.  Fever over 101.0. 2.  Inability to urinate. 3.  Nausea and/or vomiting. 4.  Extreme swelling or bruising at the surgical site. 5.  Continued bleeding from the incision. 6.  Increased pain, redness or drainage from the incision. 7.  Problems related to your pain medication. 8.  Any problems and/or concerns. 9. Shower and remove dressing 06/17/2023.

## 2023-06-16 NOTE — Anesthesia Preprocedure Evaluation (Addendum)
Anesthesia Evaluation  Patient identified by MRN, date of birth, ID band Patient awake    Reviewed: Allergy & Precautions, NPO status , Patient's Chart, lab work & pertinent test results  Airway Mallampati: II  TM Distance: >3 FB Neck ROM: Full    Dental  (+) Partial Upper, Dental Advisory Given   Pulmonary former smoker   breath sounds clear to auscultation       Cardiovascular hypertension,  Rhythm:Regular Rate:Normal     Neuro/Psych negative neurological ROS  negative psych ROS   GI/Hepatic Neg liver ROS,GERD  Medicated,,  Endo/Other  diabetes, Type 2, Oral Hypoglycemic Agents, Insulin Dependent    Renal/GU Renal disease     Musculoskeletal negative musculoskeletal ROS (+)    Abdominal   Peds  Hematology negative hematology ROS (+)   Anesthesia Other Findings   Reproductive/Obstetrics                             Anesthesia Physical Anesthesia Plan  ASA: 2  Anesthesia Plan: MAC   Post-op Pain Management:    Induction: Intravenous  PONV Risk Score and Plan: 2 and Ondansetron, Propofol infusion and Midazolam  Airway Management Planned: Simple Face Mask and Natural Airway  Additional Equipment: None  Intra-op Plan:   Post-operative Plan:   Informed Consent: I have reviewed the patients History and Physical, chart, labs and discussed the procedure including the risks, benefits and alternatives for the proposed anesthesia with the patient or authorized representative who has indicated his/her understanding and acceptance.       Plan Discussed with: CRNA  Anesthesia Plan Comments:        Anesthesia Quick Evaluation

## 2023-06-17 ENCOUNTER — Encounter (HOSPITAL_BASED_OUTPATIENT_CLINIC_OR_DEPARTMENT_OTHER): Payer: Self-pay | Admitting: Urology

## 2023-06-24 ENCOUNTER — Other Ambulatory Visit: Payer: Self-pay | Admitting: Endocrinology

## 2023-06-27 ENCOUNTER — Telehealth: Payer: Self-pay | Admitting: *Deleted

## 2023-06-27 NOTE — Telephone Encounter (Signed)
Called patient to remind of siim appt. for 06-28-23- arrival time- 1:45 pm @ [facility], informed patient to arrive with a full bladder, spoke with patient and he is aware of this appt. and the instructions

## 2023-06-28 ENCOUNTER — Other Ambulatory Visit: Payer: Self-pay

## 2023-06-28 ENCOUNTER — Ambulatory Visit
Admission: RE | Admit: 2023-06-28 | Discharge: 2023-06-28 | Disposition: A | Discharge status: Home / Self Care | Payer: BC Managed Care – PPO | Source: Ambulatory Visit | Attending: Radiation Oncology | Admitting: Radiation Oncology

## 2023-06-28 DIAGNOSIS — C61 Malignant neoplasm of prostate: Secondary | ICD-10-CM | POA: Insufficient documentation

## 2023-06-28 DIAGNOSIS — Z510 Encounter for antineoplastic radiation therapy: Secondary | ICD-10-CM | POA: Diagnosis present

## 2023-06-28 NOTE — Progress Notes (Signed)
  Radiation Oncology         [phone]) [phone] ________________________________  Name: Kenneth [name] MRN: [id number]  Date: 06/28/2023  DOB: December 16, 1956  SIMULATION AND TREATMENT PLANNING NOTE    ICD-10-CM   1. Malignant neoplasm of prostate (HCC)  C61       DIAGNOSIS:  67 y.o. gentleman with Stage T1c adenocarcinoma of the prostate with Gleason score of 3+4, and PSA of 4.55.   NARRATIVE:  The patient was brought to the CT Simulation planning suite.  Identity was confirmed.  All relevant records and images related to the planned course of therapy were reviewed.  The patient freely provided informed written consent to proceed with treatment after reviewing the details related to the planned course of therapy. The consent form was witnessed and verified by the simulation staff.  Then, the patient was set-up in a stable reproducible supine position for radiation therapy.  A vacuum lock pillow device was custom fabricated to position his legs in a reproducible immobilized position.  Then, I performed a urethrogram under sterile conditions to identify the prostatic apex.  CT images were obtained.  Surface markings were placed.  The CT images were loaded into the planning software.  Then the prostate target and avoidance structures including the rectum, bladder, bowel and hips were contoured.  Treatment planning then occurred.  The radiation prescription was entered and confirmed.  A total of one complex treatment devices was fabricated. I have requested : Intensity Modulated Radiotherapy (IMRT) is medically necessary for this case for the following reason:  Rectal sparing.Marland Kitchen  PLAN:  The patient will receive 70 Gy in 28 fractions.  ________________________________  Artist Pais Kathrynn Running, M.D.

## 2023-07-01 ENCOUNTER — Other Ambulatory Visit: Payer: Self-pay

## 2023-07-01 MED ORDER — DEXCOM G7 SENSOR MISC: 3 refills | Status: DC

## 2023-07-04 ENCOUNTER — Other Ambulatory Visit (HOSPITAL_COMMUNITY): Payer: Self-pay

## 2023-07-04 ENCOUNTER — Telehealth: Payer: Self-pay

## 2023-07-04 NOTE — Telephone Encounter (Signed)
Patient Advocate Encounter   Received notification from [facility] that prior authorization is required for Dexcom G7 sensor  Submitted: 07/04/23 Key [id number]  Status is pending

## 2023-07-05 ENCOUNTER — Telehealth: Payer: Self-pay

## 2023-07-05 ENCOUNTER — Other Ambulatory Visit (HOSPITAL_COMMUNITY): Payer: Self-pay

## 2023-07-05 NOTE — Telephone Encounter (Signed)
Kenneth [name] is aware

## 2023-07-05 NOTE — Telephone Encounter (Signed)
Per Kenneth [name] his Dexcom G7 Sensors need a prior auth , please advise?

## 2023-07-05 NOTE — Telephone Encounter (Signed)
PA request has been Approved. For additional info see Pharmacy Prior Auth telephone encounter from 07/05/23.

## 2023-07-05 NOTE — Telephone Encounter (Signed)
Pharmacy Patient Advocate Encounter  Received notification from [facility] that Prior Authorization for Dexcom G7 sensor has been APPROVED from 07/04/23 to 07/03/24.Kenneth [name]

## 2023-07-07 DIAGNOSIS — Z510 Encounter for antineoplastic radiation therapy: Secondary | ICD-10-CM | POA: Diagnosis not present

## 2023-07-11 ENCOUNTER — Other Ambulatory Visit: Payer: Self-pay

## 2023-07-11 ENCOUNTER — Ambulatory Visit
Admission: RE | Admit: 2023-07-11 | Discharge: 2023-07-11 | Disposition: A | Discharge status: Home / Self Care | Payer: BC Managed Care – PPO | Source: Ambulatory Visit | Attending: Radiation Oncology | Admitting: Radiation Oncology

## 2023-07-11 ENCOUNTER — Other Ambulatory Visit: Payer: Self-pay | Admitting: Endocrinology

## 2023-07-11 DIAGNOSIS — E1165 Type 2 diabetes mellitus with hyperglycemia: Secondary | ICD-10-CM

## 2023-07-11 DIAGNOSIS — Z510 Encounter for antineoplastic radiation therapy: Secondary | ICD-10-CM | POA: Diagnosis not present

## 2023-07-11 DIAGNOSIS — C61 Malignant neoplasm of prostate: Secondary | ICD-10-CM

## 2023-07-11 LAB — RAD ONC ARIA SESSION SUMMARY
Course Elapsed Days: 0
Plan Fractions Treated to Date: 1
Plan Prescribed Dose Per Fraction: 2.5 Gy
Plan Total Fractions Prescribed: 28
Plan Total Prescribed Dose: 70 Gy
Reference Point Dosage Given to Date: 2.5 Gy
Reference Point Session Dosage Given: 2.5 Gy
Session Number: 1

## 2023-07-12 ENCOUNTER — Ambulatory Visit
Admission: RE | Admit: 2023-07-12 | Discharge: 2023-07-12 | Disposition: A | Discharge status: Home / Self Care | Payer: BC Managed Care – PPO | Source: Ambulatory Visit | Attending: Radiation Oncology | Admitting: Radiation Oncology

## 2023-07-12 ENCOUNTER — Other Ambulatory Visit: Payer: Self-pay

## 2023-07-12 DIAGNOSIS — Z510 Encounter for antineoplastic radiation therapy: Secondary | ICD-10-CM | POA: Diagnosis not present

## 2023-07-12 LAB — RAD ONC ARIA SESSION SUMMARY
Course Elapsed Days: 1
Plan Fractions Treated to Date: 2
Plan Prescribed Dose Per Fraction: 2.5 Gy
Plan Total Fractions Prescribed: 28
Plan Total Prescribed Dose: 70 Gy
Reference Point Dosage Given to Date: 5 Gy
Reference Point Session Dosage Given: 2.5 Gy
Session Number: 2

## 2023-07-13 ENCOUNTER — Other Ambulatory Visit: Payer: Self-pay

## 2023-07-13 ENCOUNTER — Ambulatory Visit
Admission: RE | Admit: 2023-07-13 | Discharge: 2023-07-13 | Disposition: A | Discharge status: Home / Self Care | Payer: BC Managed Care – PPO | Source: Ambulatory Visit | Attending: Radiation Oncology | Admitting: Radiation Oncology

## 2023-07-13 DIAGNOSIS — Z510 Encounter for antineoplastic radiation therapy: Secondary | ICD-10-CM | POA: Diagnosis not present

## 2023-07-13 LAB — RAD ONC ARIA SESSION SUMMARY
Course Elapsed Days: 2
Plan Fractions Treated to Date: 3
Plan Prescribed Dose Per Fraction: 2.5 Gy
Plan Total Fractions Prescribed: 28
Plan Total Prescribed Dose: 70 Gy
Reference Point Dosage Given to Date: 7.5 Gy
Reference Point Session Dosage Given: 2.5 Gy
Session Number: 3

## 2023-07-13 NOTE — Telephone Encounter (Signed)
Inadvertently selected addendum, intended telephone encounter for completion of forms.  Forms completed by other forms nurse received by this nurse returned via fax to [address] [place]. [employer] and [employer],  Copies to bin designated for items to be scanned for H.I.M. pick-up.  No further actions by this nurse.

## 2023-07-14 ENCOUNTER — Ambulatory Visit
Admission: RE | Admit: 2023-07-14 | Discharge: 2023-07-14 | Disposition: A | Discharge status: Home / Self Care | Payer: BC Managed Care – PPO | Source: Ambulatory Visit | Attending: Radiation Oncology | Admitting: Radiation Oncology

## 2023-07-14 ENCOUNTER — Other Ambulatory Visit: Payer: Self-pay

## 2023-07-14 DIAGNOSIS — Z510 Encounter for antineoplastic radiation therapy: Secondary | ICD-10-CM | POA: Diagnosis not present

## 2023-07-14 LAB — RAD ONC ARIA SESSION SUMMARY
Course Elapsed Days: 3
Plan Fractions Treated to Date: 4
Plan Prescribed Dose Per Fraction: 2.5 Gy
Plan Total Fractions Prescribed: 28
Plan Total Prescribed Dose: 70 Gy
Reference Point Dosage Given to Date: 10 Gy
Reference Point Session Dosage Given: 2.5 Gy
Session Number: 4

## 2023-07-15 ENCOUNTER — Ambulatory Visit: Admission: RE | Admit: 2023-07-15 | Payer: BC Managed Care – PPO | Source: Ambulatory Visit

## 2023-07-15 ENCOUNTER — Other Ambulatory Visit: Payer: Self-pay

## 2023-07-15 ENCOUNTER — Ambulatory Visit
Admission: RE | Admit: 2023-07-15 | Discharge: 2023-07-15 | Disposition: A | Discharge status: Home / Self Care | Payer: BC Managed Care – PPO | Source: Ambulatory Visit | Attending: Radiation Oncology | Admitting: Radiation Oncology

## 2023-07-15 ENCOUNTER — Telehealth: Payer: Self-pay

## 2023-07-15 DIAGNOSIS — Z510 Encounter for antineoplastic radiation therapy: Secondary | ICD-10-CM | POA: Diagnosis not present

## 2023-07-15 LAB — RAD ONC ARIA SESSION SUMMARY
Course Elapsed Days: 4
Plan Fractions Treated to Date: 5
Plan Prescribed Dose Per Fraction: 2.5 Gy
Plan Total Fractions Prescribed: 28
Plan Total Prescribed Dose: 70 Gy
Reference Point Dosage Given to Date: 12.5 Gy
Reference Point Session Dosage Given: 2.5 Gy
Session Number: 5

## 2023-07-15 NOTE — Telephone Encounter (Signed)
Notified patient of completion of FMLA forms and Cancer Claim Form. Fax transmission confirmation received. Copy of forms mailed to paient as requested. No other needs or concerns voiced at this time.

## 2023-07-18 ENCOUNTER — Other Ambulatory Visit: Payer: Self-pay

## 2023-07-18 ENCOUNTER — Ambulatory Visit
Admission: RE | Admit: 2023-07-18 | Discharge: 2023-07-18 | Disposition: A | Discharge status: Home / Self Care | Payer: BC Managed Care – PPO | Source: Ambulatory Visit | Attending: Radiation Oncology | Admitting: Radiation Oncology

## 2023-07-18 DIAGNOSIS — Z510 Encounter for antineoplastic radiation therapy: Secondary | ICD-10-CM | POA: Diagnosis not present

## 2023-07-18 LAB — RAD ONC ARIA SESSION SUMMARY
Course Elapsed Days: 7
Plan Fractions Treated to Date: 6
Plan Prescribed Dose Per Fraction: 2.5 Gy
Plan Total Fractions Prescribed: 28
Plan Total Prescribed Dose: 70 Gy
Reference Point Dosage Given to Date: 15 Gy
Reference Point Session Dosage Given: 2.5 Gy
Session Number: 6

## 2023-07-19 ENCOUNTER — Other Ambulatory Visit: Payer: Self-pay

## 2023-07-19 ENCOUNTER — Ambulatory Visit
Admission: RE | Admit: 2023-07-19 | Discharge: 2023-07-19 | Disposition: A | Discharge status: Home / Self Care | Payer: BC Managed Care – PPO | Source: Ambulatory Visit | Attending: Radiation Oncology | Admitting: Radiation Oncology

## 2023-07-19 DIAGNOSIS — Z510 Encounter for antineoplastic radiation therapy: Secondary | ICD-10-CM | POA: Diagnosis not present

## 2023-07-19 LAB — RAD ONC ARIA SESSION SUMMARY
Course Elapsed Days: 8
Plan Fractions Treated to Date: 7
Plan Prescribed Dose Per Fraction: 2.5 Gy
Plan Total Fractions Prescribed: 28
Plan Total Prescribed Dose: 70 Gy
Reference Point Dosage Given to Date: 17.5 Gy
Reference Point Session Dosage Given: 2.5 Gy
Session Number: 7

## 2023-07-20 ENCOUNTER — Other Ambulatory Visit: Payer: Self-pay

## 2023-07-20 ENCOUNTER — Ambulatory Visit: Admission: RE | Admit: 2023-07-20 | Payer: BC Managed Care – PPO | Source: Ambulatory Visit

## 2023-07-20 DIAGNOSIS — Z510 Encounter for antineoplastic radiation therapy: Secondary | ICD-10-CM | POA: Diagnosis not present

## 2023-07-20 LAB — RAD ONC ARIA SESSION SUMMARY
Course Elapsed Days: 9
Plan Fractions Treated to Date: 8
Plan Prescribed Dose Per Fraction: 2.5 Gy
Plan Total Fractions Prescribed: 28
Plan Total Prescribed Dose: 70 Gy
Reference Point Dosage Given to Date: 20 Gy
Reference Point Session Dosage Given: 2.5 Gy
Session Number: 8

## 2023-07-21 ENCOUNTER — Ambulatory Visit
Admission: RE | Admit: 2023-07-21 | Discharge: 2023-07-21 | Disposition: A | Discharge status: Home / Self Care | Payer: BC Managed Care – PPO | Source: Ambulatory Visit | Attending: Radiation Oncology | Admitting: Radiation Oncology

## 2023-07-21 ENCOUNTER — Other Ambulatory Visit: Payer: Self-pay

## 2023-07-21 DIAGNOSIS — Z510 Encounter for antineoplastic radiation therapy: Secondary | ICD-10-CM | POA: Insufficient documentation

## 2023-07-21 DIAGNOSIS — C61 Malignant neoplasm of prostate: Secondary | ICD-10-CM | POA: Diagnosis present

## 2023-07-21 LAB — RAD ONC ARIA SESSION SUMMARY
Course Elapsed Days: 10
Plan Fractions Treated to Date: 9
Plan Prescribed Dose Per Fraction: 2.5 Gy
Plan Total Fractions Prescribed: 28
Plan Total Prescribed Dose: 70 Gy
Reference Point Dosage Given to Date: 22.5 Gy
Reference Point Session Dosage Given: 2.5 Gy
Session Number: 9

## 2023-07-22 ENCOUNTER — Ambulatory Visit
Admission: RE | Admit: 2023-07-22 | Discharge: 2023-07-22 | Disposition: A | Discharge status: Home / Self Care | Payer: BC Managed Care – PPO | Source: Ambulatory Visit | Attending: Radiation Oncology | Admitting: Radiation Oncology

## 2023-07-22 ENCOUNTER — Other Ambulatory Visit: Payer: Self-pay

## 2023-07-22 DIAGNOSIS — Z510 Encounter for antineoplastic radiation therapy: Secondary | ICD-10-CM | POA: Diagnosis not present

## 2023-07-22 LAB — RAD ONC ARIA SESSION SUMMARY
Course Elapsed Days: 11
Plan Fractions Treated to Date: 10
Plan Prescribed Dose Per Fraction: 2.5 Gy
Plan Total Fractions Prescribed: 28
Plan Total Prescribed Dose: 70 Gy
Reference Point Dosage Given to Date: 25 Gy
Reference Point Session Dosage Given: 2.5 Gy
Session Number: 10

## 2023-07-25 ENCOUNTER — Telehealth: Payer: Self-pay | Admitting: Family Medicine

## 2023-07-25 ENCOUNTER — Ambulatory Visit
Admission: RE | Admit: 2023-07-25 | Discharge: 2023-07-25 | Disposition: A | Discharge status: Home / Self Care | Payer: BC Managed Care – PPO | Source: Ambulatory Visit | Attending: Radiation Oncology | Admitting: Radiation Oncology

## 2023-07-25 ENCOUNTER — Other Ambulatory Visit: Payer: Self-pay

## 2023-07-25 DIAGNOSIS — Z510 Encounter for antineoplastic radiation therapy: Secondary | ICD-10-CM | POA: Diagnosis not present

## 2023-07-25 LAB — RAD ONC ARIA SESSION SUMMARY
Course Elapsed Days: 14
Plan Fractions Treated to Date: 11
Plan Prescribed Dose Per Fraction: 2.5 Gy
Plan Total Fractions Prescribed: 28
Plan Total Prescribed Dose: 70 Gy
Reference Point Dosage Given to Date: 27.5 Gy
Reference Point Session Dosage Given: 2.5 Gy
Session Number: 11

## 2023-07-25 NOTE — Telephone Encounter (Signed)
Pt has been taking tylenol, not helping with his back pain, asking if there is a better alternative. Currently undergoing cancer treatment

## 2023-07-26 ENCOUNTER — Other Ambulatory Visit: Payer: Self-pay

## 2023-07-26 ENCOUNTER — Ambulatory Visit
Admission: RE | Admit: 2023-07-26 | Discharge: 2023-07-26 | Disposition: A | Discharge status: Home / Self Care | Payer: BC Managed Care – PPO | Source: Ambulatory Visit | Attending: Radiation Oncology | Admitting: Radiation Oncology

## 2023-07-26 DIAGNOSIS — Z510 Encounter for antineoplastic radiation therapy: Secondary | ICD-10-CM | POA: Diagnosis not present

## 2023-07-26 LAB — RAD ONC ARIA SESSION SUMMARY
Course Elapsed Days: 15
Plan Fractions Treated to Date: 12
Plan Prescribed Dose Per Fraction: 2.5 Gy
Plan Total Fractions Prescribed: 28
Plan Total Prescribed Dose: 70 Gy
Reference Point Dosage Given to Date: 30 Gy
Reference Point Session Dosage Given: 2.5 Gy
Session Number: 12

## 2023-07-27 ENCOUNTER — Ambulatory Visit: Payer: BC Managed Care – PPO | Admitting: Family Medicine

## 2023-07-27 ENCOUNTER — Encounter: Payer: Self-pay | Admitting: Family Medicine

## 2023-07-27 ENCOUNTER — Other Ambulatory Visit: Payer: Self-pay

## 2023-07-27 ENCOUNTER — Ambulatory Visit
Admission: RE | Admit: 2023-07-27 | Discharge: 2023-07-27 | Disposition: A | Discharge status: Home / Self Care | Payer: BC Managed Care – PPO | Source: Ambulatory Visit | Attending: Radiation Oncology | Admitting: Radiation Oncology

## 2023-07-27 VITALS — BP 124/70 | HR 66 | Temp 98.7°F | Wt 224.0 lb

## 2023-07-27 DIAGNOSIS — M545 Low back pain, unspecified: Secondary | ICD-10-CM

## 2023-07-27 DIAGNOSIS — Z510 Encounter for antineoplastic radiation therapy: Secondary | ICD-10-CM | POA: Diagnosis not present

## 2023-07-27 LAB — RAD ONC ARIA SESSION SUMMARY
Course Elapsed Days: 16
Plan Fractions Treated to Date: 13
Plan Prescribed Dose Per Fraction: 2.5 Gy
Plan Total Fractions Prescribed: 28
Plan Total Prescribed Dose: 70 Gy
Reference Point Dosage Given to Date: 32.5 Gy
Reference Point Session Dosage Given: 2.5 Gy
Session Number: 13

## 2023-07-27 NOTE — Progress Notes (Signed)
   Subjective:    Patient ID: Kenneth [name], male    DOB: 06-04-1956, 67 y.o.   MRN: [id number]  HPI Here to check his lower back. He developed a tightness and a pain in the left lower back about a week ago. No recent trauma. He is receiving radiation treatments for the prostate cancer. He has tried heat and Tylenol,   and it seems to be getting better. No bowel or urinary symptoms.    Review of Systems  Constitutional: Negative.   Respiratory: Negative.    Cardiovascular: Negative.   Gastrointestinal: Negative.   Genitourinary: Negative.   Musculoskeletal:  Positive for back pain.       Objective:   Physical Exam Constitutional:      Appearance: Normal appearance.  Cardiovascular:     Rate and Rhythm: Normal rate and regular rhythm.     Pulses: Normal pulses.     Heart sounds: Normal heart sounds.  Pulmonary:     Effort: Pulmonary effort is normal.     Breath sounds: Normal breath sounds.  Musculoskeletal:     Comments: Mildly tender in the left lower back, the spine has full ROM  Neurological:     Mental Status: He is alert.           Assessment & Plan:  Muscular low back pain. He will continue using Tylenol. Recheck as needed. Gershon Crane, MD

## 2023-07-28 ENCOUNTER — Other Ambulatory Visit: Payer: Self-pay

## 2023-07-28 ENCOUNTER — Ambulatory Visit
Admission: RE | Admit: 2023-07-28 | Discharge: 2023-07-28 | Disposition: A | Discharge status: Home / Self Care | Payer: BC Managed Care – PPO | Source: Ambulatory Visit | Attending: Radiation Oncology | Admitting: Radiation Oncology

## 2023-07-28 DIAGNOSIS — Z510 Encounter for antineoplastic radiation therapy: Secondary | ICD-10-CM | POA: Diagnosis not present

## 2023-07-28 LAB — RAD ONC ARIA SESSION SUMMARY
Course Elapsed Days: 17
Plan Fractions Treated to Date: 14
Plan Prescribed Dose Per Fraction: 2.5 Gy
Plan Total Fractions Prescribed: 28
Plan Total Prescribed Dose: 70 Gy
Reference Point Dosage Given to Date: 35 Gy
Reference Point Session Dosage Given: 2.5 Gy
Session Number: 14

## 2023-07-29 ENCOUNTER — Other Ambulatory Visit: Payer: Self-pay

## 2023-07-29 ENCOUNTER — Ambulatory Visit: Admission: RE | Admit: 2023-07-29 | Payer: BC Managed Care – PPO | Source: Ambulatory Visit

## 2023-07-29 DIAGNOSIS — Z510 Encounter for antineoplastic radiation therapy: Secondary | ICD-10-CM | POA: Diagnosis not present

## 2023-07-29 LAB — RAD ONC ARIA SESSION SUMMARY
Course Elapsed Days: 18
Plan Fractions Treated to Date: 15
Plan Prescribed Dose Per Fraction: 2.5 Gy
Plan Total Fractions Prescribed: 28
Plan Total Prescribed Dose: 70 Gy
Reference Point Dosage Given to Date: 37.5 Gy
Reference Point Session Dosage Given: 2.5 Gy
Session Number: 15

## 2023-08-01 ENCOUNTER — Ambulatory Visit: Payer: BC Managed Care – PPO

## 2023-08-02 ENCOUNTER — Ambulatory Visit
Admission: RE | Admit: 2023-08-02 | Discharge: 2023-08-02 | Disposition: A | Discharge status: Home / Self Care | Payer: BC Managed Care – PPO | Source: Ambulatory Visit | Attending: Radiation Oncology | Admitting: Radiation Oncology

## 2023-08-02 ENCOUNTER — Ambulatory Visit (INDEPENDENT_AMBULATORY_CARE_PROVIDER_SITE_OTHER): Payer: BC Managed Care – PPO | Admitting: Endocrinology

## 2023-08-02 ENCOUNTER — Encounter: Payer: Self-pay | Admitting: Endocrinology

## 2023-08-02 ENCOUNTER — Other Ambulatory Visit: Payer: Self-pay

## 2023-08-02 VITALS — BP 128/64 | HR 65 | Ht 71.0 in | Wt 223.2 lb

## 2023-08-02 DIAGNOSIS — Z794 Long term (current) use of insulin: Secondary | ICD-10-CM | POA: Diagnosis not present

## 2023-08-02 DIAGNOSIS — E1165 Type 2 diabetes mellitus with hyperglycemia: Secondary | ICD-10-CM | POA: Diagnosis not present

## 2023-08-02 DIAGNOSIS — Z510 Encounter for antineoplastic radiation therapy: Secondary | ICD-10-CM | POA: Diagnosis not present

## 2023-08-02 LAB — RAD ONC ARIA SESSION SUMMARY
Course Elapsed Days: 22
Plan Fractions Treated to Date: 16
Plan Prescribed Dose Per Fraction: 2.5 Gy
Plan Total Fractions Prescribed: 28
Plan Total Prescribed Dose: 70 Gy
Reference Point Dosage Given to Date: 40 Gy
Reference Point Session Dosage Given: 2.5 Gy
Session Number: 16

## 2023-08-02 LAB — POCT GLYCOSYLATED HEMOGLOBIN (HGB A1C): Hemoglobin A1C: 6.1 % — AB (ref 4.0–5.6)

## 2023-08-02 NOTE — Progress Notes (Unsigned)
Outpatient Endocrinology Note [place] , MD  08/03/23  Patient's Name: Kenneth [name]    DOB: [birth year]    MRN: [id number]                                                    REASON OF VISIT: Follow up  for type 2 diabetes mellitus  PCP: [name], MD  HISTORY OF PRESENT ILLNESS:   Kenneth [name] is a 67 y.o. old male with past medical history listed below, is here for follow up of type 2 diabetes mellitus.   Pertinent Diabetes History:  _Diagnosed as type 2 in 2008.  He has history of diabetic ketoacidosis in the past around 2011.  He was initially treated with metformin and then insulin therapy was started soon after diagnosis.  Chronic Diabetes Complications : Retinopathy: unknown. Last ophthalmology exam was done on due Nephropathy: no Peripheral neuropathy: no Coronary artery disease: no Stroke: no  Relevant comorbidities and cardiovascular risk factors: Obesity: yes Body mass index is 31.13 kg/m.  Hypertension: yes Hyperlipidemia. Yes, on a statin.  Current / Home Diabetic regimen includes: Tresiba 54 units daily. NovoLog 8 to 14 units with breakfast, lunch and supper depending upon the meal and blood sugar. Jardiance 25 mg daily.  Prior diabetic medications: Metformin stopped due to GI intolerance.  Glycemic data:    CONTINUOUS GLUCOSE MONITORING SYSTEM (CGMS) INTERPRETATION: At today's visit, we reviewed CGM downloads. The full report is scanned in the media. Reviewing the CGM trends, blood glucose are as follows:  Dexcom G7 CGM-  Sensor Download (Sensor download was reviewed and summarized below.) Dates: July 31 to August 02, 2023 last 14 days  Glucose Management Indicator: 6.8% Sensor Average: 144 SD 48  Glycemic Trends:  <54: <1% 54-70: 1% 71-180: 78% 181-250: 17% 251-400: 4%  Interpretation: -Overnight blood sugar trending down with occasional mild hypoglycemia.  Blood sugar after breakfast and lunch mostly acceptable with  occasional hyperglycemia about 250 especially after supper related to his meal.  Occasional hypoglycemia related with lunch.  Hypoglycemia: Patient has minor hypoglycemic episodes. Patient has hypoglycemia awareness.  Factors modifying glucose control: 1.  Diabetic diet assessment: Lately has been eating small meals, especially with lunch and supper.  2.  Staying active or exercising: Active at work.  3.  Medication compliance: compliant all of the time.  Interval history 08/03/23 CGM data as reviewed above.  Hemoglobin A1c today 6.1%.  Denies numbness and tingling of the feet.  No vision problem.  No other complaints today.  He has been eating small meal these days, he has been undergoing radiation therapy for prostate cancer, he has been adjusting the NovoLog based on meals.  REVIEW OF SYSTEMS As per history of present illness.   PAST MEDICAL HISTORY: Past Medical History:  Diagnosis Date   Diabetes mellitus type 2    sees Dr. Romero Belling    Hyperlipidemia    Prostate cancer [facility])     PAST SURGICAL HISTORY: Past Surgical History:  Procedure Laterality Date   COLONOSCOPY  05/06/2014   per Dr. Marina Goodell, clear, repeat in 10 yrs    GOLD SEED IMPLANT N/A 06/16/2023   Procedure: GOLD SEED IMPLANT;  Surgeon: Heloise Purpura, MD;  Location: [facility];  Service: Urology;  Laterality: N/A;   LAPAROSCOPIC APPENDECTOMY N/A 04/06/2017  Procedure: APPENDECTOMY LAPAROSCOPIC with primary repair of umbilical hernia;  Surgeon: Violeta Gelinas, MD;  Location: [facility] OR;  Service: General;  Laterality: N/A;   PROSTATE BIOPSY     SPACE OAR INSTILLATION N/A 06/16/2023   Procedure: SPACE OAR INSTILLATION;  Surgeon: Heloise Purpura, MD;  [facility];  Service: Urology;  Laterality: N/A;    ALLERGIES: Allergies  Allergen Reactions   Metformin And Related Nausea And Vomiting    FAMILY HISTORY:  Family History  Problem Relation Age of Onset   Breast cancer  Mother    Diabetes Father    Hypertension Father    Alcohol abuse Father    Diabetes Sister    Colon cancer Neg Hx    Pancreatic cancer Neg Hx    Stomach cancer Neg Hx     SOCIAL HISTORY: Social History   Socioeconomic History   Marital status: Single    Spouse name: Not on file   Number of children: Not on file   Years of education: Not on file   Highest education level: Not on file  Occupational History   Not on file  Tobacco Use   Smoking status: Former    Current packs/day: 0.00    Types: Cigarettes    Quit date: 01/30/2012    Years since quitting: 11.5   Smokeless tobacco: Never  Vaping Use   Vaping status: Never Used  Substance and Sexual Activity   Alcohol use: Not Currently   Drug use: No   Sexual activity: Not on file  Other Topics Concern   Not on file  Social History Narrative   Not on file   Social Determinants of Health   Financial Resource Strain: Not on file  Food Insecurity: No Food Insecurity (04/13/2023)   Hunger Vital Sign    Worried About Running Out of Food in the Last Year: Never true    Ran Out of Food in the Last Year: Never true  Transportation Needs: No Transportation Needs (04/13/2023)   PRAPARE - Administrator, Civil Service (Medical): No    Lack of Transportation (Non-Medical): No  Physical Activity: Not on file  Stress: Not on file  Social Connections: Not on file    MEDICATIONS:  Current Outpatient Medications  Medication Sig Dispense Refill   acetaminophen (TYLENOL) 500 MG tablet Take 1,000 mg by mouth every 6 (six) hours as needed.     Continuous Glucose Sensor (DEXCOM G7 SENSOR) MISC CHANGE SENSOR EVERY 10 DAYS AS DIRECTED 3 each 3   glucose blood (ONETOUCH VERIO) test strip 1 each by Other route 2 (two) times daily. And lancets 2/day. 100 each 12   JARDIANCE 25 MG TABS tablet TAKE 1 TABLET BY MOUTH ONCE DAILY BEFORE BREAKFAST 30 tablet 0   NOVOLOG FLEXPEN 100 UNIT/ML FlexPen INJECT 8 UNITS SUBCUTANEOUSLY IN THE  MORNING AND 12 AT LUNCH AND 15 AT SUPPER (Patient taking differently: INJECT 8 UNITS SUBCUTANEOUSLY IN THE MORNING AND 12 AT LUNCH AND 14 AT SUPPER) 15 mL 0   omeprazole (PRILOSEC) 40 MG capsule Take 1 capsule (40 mg total) by mouth daily. 90 capsule 3   simvastatin (ZOCOR) 20 MG tablet TAKE 1 TABLET BY MOUTH AT BEDTIME 90 tablet 0   TRESIBA FLEXTOUCH 200 UNIT/ML FlexTouch Pen INJECT 60 UNITS INTO THE SKIN ONCE DAILY 9 mL 0   No current facility-administered medications for this visit.    PHYSICAL EXAM: Vitals:   08/02/23 1046  BP: 128/64  Pulse: 65  SpO2: 99%  Weight: 223 lb 3.2 oz (101.2 kg)  Height: 5\' 11"  (1.803 m)   Body mass index is 31.13 kg/m.  Wt Readings from Last 3 Encounters:  08/02/23 223 lb 3.2 oz (101.2 kg)  07/27/23 224 lb (101.6 kg)  06/16/23 221 lb 11.2 oz (100.6 kg)    General: Well developed, well nourished male in no apparent distress.  HEENT: AT/[place], no external lesions.  Eyes: Conjunctiva clear and no icterus. Neck: Neck supple  Lungs: Respirations not labored Neurologic: Alert, oriented, normal speech Extremities / Skin: Dry. No sores or rashes noted. No acanthosis nigricans Psychiatric: Does not appear depressed or anxious  Diabetic Foot Exam - Simple   No data filed    LABS Reviewed Lab Results  Component Value Date   HGBA1C 6.1 (A) 08/02/2023   HGBA1C 7.3 (H) 03/29/2023   HGBA1C 7.0 (H) 12/27/2022   Lab Results  Component Value Date   FRUCTOSAMINE 319 (H) 12/27/2022   FRUCTOSAMINE 391 (H) 11/03/2021   FRUCTOSAMINE 230 11/19/2019   Lab Results  Component Value Date   CHOL 122 01/04/2023   HDL 35.50 (L) 01/04/2023   LDLCALC 74 01/04/2023   TRIG 64.0 01/04/2023   CHOLHDL 3 01/04/2023   Lab Results  Component Value Date   MICRALBCREAT 7.3 06/03/2022   MICRALBCREAT 0.9 08/02/2016   Lab Results  Component Value Date   CREATININE 1.10 06/16/2023   Lab Results  Component Value Date   GFR 82.62 03/29/2023    ASSESSMENT /  PLAN  1. Type 2 diabetes mellitus with hyperglycemia, with long-term current use of insulin (HCC)     Diabetes Mellitus type 2, complicated by no known complications. - Diabetic status / severity: Controlled.  Lab Results  Component Value Date   HGBA1C 6.1 (A) 08/02/2023    - Hemoglobin A1c goal : <7%  - Medications: Adjusted as follows: - Decrease tresiba to 50 units daily.  - Novolog 8 units with breakfast but for larger breakfast may take upto 14 units. Lunch 5-14 units and supper 5-14 units depending upon meal size.  Continue jardiance 25 mg daily.   - Home glucose testing: CGM and check blood sugar as needed. - Discussed/ Gave Hypoglycemia treatment plan.  # Consult : not required at this time.   # Annual urine for microalbuminuria/ creatinine ratio, no microalbuminuria currently.  Last  Lab Results  Component Value Date   MICRALBCREAT 7.3 06/03/2022    # Foot check nightly / neuropathy.  # Annual dilated diabetic eye exams.   - Diet: Make healthy diabetic food choices - Life style / activity / exercise: discussed.  2. Blood pressure  -  BP Readings from Last 1 Encounters:  08/02/23 128/64    - Control is in target.  - No change in current plans.  3. Lipid status / Hyperlipidemia - Last  Lab Results  Component Value Date   LDLCALC 74 01/04/2023   - Continue simvastatin 40 mg daily.    Diagnoses and all orders for this visit:  Type 2 diabetes mellitus with hyperglycemia, with long-term current use of insulin (HCC) -     POCT glycosylated hemoglobin (Hb A1C)    DISPOSITION Follow up in clinic in 3  months suggested.   All questions answered and patient verbalized understanding of the plan.  [place] , MD [facility] Endocrinology [facility] [address] [place], [address] [place], [place] [address] Phone # [phone]  At least part of this note was generated using  voice recognition software. Inadvertent word errors may have  occurred, which were not recognized during the proofreading process.

## 2023-08-02 NOTE — Patient Instructions (Signed)
Decrease tresiba to 50 units daily.  Novolog 8 units with breakfast but for larger breakfast may take upto 14 units. Lunch 5-14 units and supper 5-14 units depending upon meal size.  Continue jardiance.

## 2023-08-03 ENCOUNTER — Other Ambulatory Visit: Payer: Self-pay

## 2023-08-03 ENCOUNTER — Ambulatory Visit: Admission: RE | Admit: 2023-08-03 | Payer: BC Managed Care – PPO | Source: Ambulatory Visit

## 2023-08-03 DIAGNOSIS — Z510 Encounter for antineoplastic radiation therapy: Secondary | ICD-10-CM | POA: Diagnosis not present

## 2023-08-03 LAB — RAD ONC ARIA SESSION SUMMARY
Course Elapsed Days: 23
Plan Fractions Treated to Date: 17
Plan Prescribed Dose Per Fraction: 2.5 Gy
Plan Total Fractions Prescribed: 28
Plan Total Prescribed Dose: 70 Gy
Reference Point Dosage Given to Date: 42.5 Gy
Reference Point Session Dosage Given: 2.5 Gy
Session Number: 17

## 2023-08-03 MED ORDER — TRESIBA FLEXTOUCH 200 UNIT/ML ~~LOC~~ SOPN
PEN_INJECTOR | SUBCUTANEOUS | 0 refills | Status: DC
Start: 2023-08-03 — End: 2023-09-23

## 2023-08-03 NOTE — Addendum Note (Signed)
Addended by: , [place] on: 08/03/2023 01:17 PM   Modules accepted: Orders

## 2023-08-04 ENCOUNTER — Encounter: Payer: Self-pay | Admitting: Endocrinology

## 2023-08-04 ENCOUNTER — Ambulatory Visit: Admission: RE | Admit: 2023-08-04 | Payer: BC Managed Care – PPO | Source: Ambulatory Visit

## 2023-08-04 ENCOUNTER — Other Ambulatory Visit: Payer: Self-pay

## 2023-08-05 ENCOUNTER — Other Ambulatory Visit: Payer: Self-pay

## 2023-08-05 ENCOUNTER — Ambulatory Visit
Admission: RE | Admit: 2023-08-05 | Discharge: 2023-08-05 | Disposition: A | Discharge status: Home / Self Care | Payer: BC Managed Care – PPO | Source: Ambulatory Visit | Attending: Radiation Oncology | Admitting: Radiation Oncology

## 2023-08-05 DIAGNOSIS — Z510 Encounter for antineoplastic radiation therapy: Secondary | ICD-10-CM | POA: Diagnosis not present

## 2023-08-05 LAB — RAD ONC ARIA SESSION SUMMARY
Course Elapsed Days: 25
Plan Fractions Treated to Date: 18
Plan Prescribed Dose Per Fraction: 2.5 Gy
Plan Total Fractions Prescribed: 28
Plan Total Prescribed Dose: 70 Gy
Reference Point Dosage Given to Date: 45 Gy
Reference Point Session Dosage Given: 2.5 Gy
Session Number: 18

## 2023-08-08 ENCOUNTER — Other Ambulatory Visit: Payer: Self-pay

## 2023-08-08 ENCOUNTER — Ambulatory Visit
Admission: RE | Admit: 2023-08-08 | Discharge: 2023-08-08 | Disposition: A | Discharge status: Home / Self Care | Payer: BC Managed Care – PPO | Source: Ambulatory Visit | Attending: Radiation Oncology | Admitting: Radiation Oncology

## 2023-08-08 DIAGNOSIS — Z510 Encounter for antineoplastic radiation therapy: Secondary | ICD-10-CM | POA: Diagnosis not present

## 2023-08-08 LAB — RAD ONC ARIA SESSION SUMMARY
Course Elapsed Days: 28
Plan Fractions Treated to Date: 19
Plan Prescribed Dose Per Fraction: 2.5 Gy
Plan Total Fractions Prescribed: 28
Plan Total Prescribed Dose: 70 Gy
Reference Point Dosage Given to Date: 47.5 Gy
Reference Point Session Dosage Given: 2.5 Gy
Session Number: 19

## 2023-08-09 ENCOUNTER — Other Ambulatory Visit: Payer: Self-pay

## 2023-08-09 ENCOUNTER — Ambulatory Visit
Admission: RE | Admit: 2023-08-09 | Discharge: 2023-08-09 | Disposition: A | Discharge status: Home / Self Care | Payer: BC Managed Care – PPO | Source: Ambulatory Visit | Attending: Radiation Oncology | Admitting: Radiation Oncology

## 2023-08-09 DIAGNOSIS — Z510 Encounter for antineoplastic radiation therapy: Secondary | ICD-10-CM | POA: Diagnosis not present

## 2023-08-09 LAB — RAD ONC ARIA SESSION SUMMARY
Course Elapsed Days: 29
Plan Fractions Treated to Date: 20
Plan Prescribed Dose Per Fraction: 2.5 Gy
Plan Total Fractions Prescribed: 28
Plan Total Prescribed Dose: 70 Gy
Reference Point Dosage Given to Date: 50 Gy
Reference Point Session Dosage Given: 2.5 Gy
Session Number: 20

## 2023-08-10 ENCOUNTER — Ambulatory Visit: Admission: RE | Admit: 2023-08-10 | Payer: BC Managed Care – PPO | Source: Ambulatory Visit

## 2023-08-10 ENCOUNTER — Other Ambulatory Visit: Payer: Self-pay

## 2023-08-10 ENCOUNTER — Ambulatory Visit
Admission: RE | Admit: 2023-08-10 | Discharge: 2023-08-10 | Disposition: A | Discharge status: Home / Self Care | Payer: BC Managed Care – PPO | Source: Ambulatory Visit | Attending: Radiation Oncology | Admitting: Radiation Oncology

## 2023-08-10 DIAGNOSIS — Z510 Encounter for antineoplastic radiation therapy: Secondary | ICD-10-CM | POA: Diagnosis not present

## 2023-08-10 LAB — RAD ONC ARIA SESSION SUMMARY
Course Elapsed Days: 30
Course Elapsed Days: 30
Plan Fractions Treated to Date: 21
Plan Fractions Treated to Date: 22
Plan Prescribed Dose Per Fraction: 2.5 Gy
Plan Prescribed Dose Per Fraction: 2.5 Gy
Plan Total Fractions Prescribed: 28
Plan Total Fractions Prescribed: 28
Plan Total Prescribed Dose: 70 Gy
Plan Total Prescribed Dose: 70 Gy
Reference Point Dosage Given to Date: 52.5 Gy
Reference Point Dosage Given to Date: 55 Gy
Reference Point Session Dosage Given: 2.5 Gy
Reference Point Session Dosage Given: 2.5 Gy
Session Number: 21
Session Number: 22

## 2023-08-11 ENCOUNTER — Other Ambulatory Visit: Payer: Self-pay

## 2023-08-11 ENCOUNTER — Ambulatory Visit
Admission: RE | Admit: 2023-08-11 | Discharge: 2023-08-11 | Disposition: A | Discharge status: Home / Self Care | Payer: BC Managed Care – PPO | Source: Ambulatory Visit | Attending: Radiation Oncology | Admitting: Radiation Oncology

## 2023-08-11 DIAGNOSIS — Z510 Encounter for antineoplastic radiation therapy: Secondary | ICD-10-CM | POA: Diagnosis not present

## 2023-08-11 LAB — RAD ONC ARIA SESSION SUMMARY
Course Elapsed Days: 31
Plan Fractions Treated to Date: 23
Plan Prescribed Dose Per Fraction: 2.5 Gy
Plan Total Fractions Prescribed: 28
Plan Total Prescribed Dose: 70 Gy
Reference Point Dosage Given to Date: 57.5 Gy
Reference Point Session Dosage Given: 2.5 Gy
Session Number: 23

## 2023-08-12 ENCOUNTER — Ambulatory Visit
Admission: RE | Admit: 2023-08-12 | Discharge: 2023-08-12 | Disposition: A | Discharge status: Home / Self Care | Payer: BC Managed Care – PPO | Source: Ambulatory Visit | Attending: Radiation Oncology | Admitting: Radiation Oncology

## 2023-08-12 ENCOUNTER — Other Ambulatory Visit: Payer: Self-pay

## 2023-08-12 ENCOUNTER — Telehealth: Payer: Self-pay | Admitting: *Deleted

## 2023-08-12 DIAGNOSIS — Z510 Encounter for antineoplastic radiation therapy: Secondary | ICD-10-CM | POA: Diagnosis not present

## 2023-08-12 LAB — RAD ONC ARIA SESSION SUMMARY
Course Elapsed Days: 32
Plan Fractions Treated to Date: 24
Plan Prescribed Dose Per Fraction: 2.5 Gy
Plan Total Fractions Prescribed: 28
Plan Total Prescribed Dose: 70 Gy
Reference Point Dosage Given to Date: 60 Gy
Reference Point Session Dosage Given: 2.5 Gy
Session Number: 24

## 2023-08-12 NOTE — Telephone Encounter (Signed)
Received Secure Chat: Mr. Dala Dock would like to extend his FLMA for his job at [employer]. Let me know if you need me to write letter or anything he said that you will be able to fax something updating his request.   Noted [employer] FMLA form received 06/28/2023 completed 07/13/2023 is beyond time frame of provider signature to simply change or update form.  Advised the following.   He needs to bring a new form, complete the cover sheet describing needs (dates of extension) and sign [place] authorization form.  and we'll add him to the list,  Addendum added to direct patients to form staff offices.

## 2023-08-15 ENCOUNTER — Other Ambulatory Visit: Payer: Self-pay

## 2023-08-15 ENCOUNTER — Ambulatory Visit
Admission: RE | Admit: 2023-08-15 | Discharge: 2023-08-15 | Disposition: A | Discharge status: Home / Self Care | Payer: BC Managed Care – PPO | Source: Ambulatory Visit | Attending: Radiation Oncology | Admitting: Radiation Oncology

## 2023-08-15 DIAGNOSIS — Z510 Encounter for antineoplastic radiation therapy: Secondary | ICD-10-CM | POA: Diagnosis not present

## 2023-08-15 LAB — RAD ONC ARIA SESSION SUMMARY
Course Elapsed Days: 35
Plan Fractions Treated to Date: 25
Plan Prescribed Dose Per Fraction: 2.5 Gy
Plan Total Fractions Prescribed: 28
Plan Total Prescribed Dose: 70 Gy
Reference Point Dosage Given to Date: 62.5 Gy
Reference Point Session Dosage Given: 2.5 Gy
Session Number: 25

## 2023-08-16 ENCOUNTER — Ambulatory Visit
Admission: RE | Admit: 2023-08-16 | Discharge: 2023-08-16 | Disposition: A | Discharge status: Home / Self Care | Payer: BC Managed Care – PPO | Source: Ambulatory Visit | Attending: Radiation Oncology | Admitting: Radiation Oncology

## 2023-08-16 ENCOUNTER — Other Ambulatory Visit: Payer: Self-pay

## 2023-08-16 DIAGNOSIS — Z510 Encounter for antineoplastic radiation therapy: Secondary | ICD-10-CM | POA: Diagnosis not present

## 2023-08-16 LAB — RAD ONC ARIA SESSION SUMMARY
Course Elapsed Days: 36
Course Elapsed Days: 36
Plan Fractions Treated to Date: 26
Plan Fractions Treated to Date: 27
Plan Prescribed Dose Per Fraction: 2.5 Gy
Plan Prescribed Dose Per Fraction: 2.5 Gy
Plan Total Fractions Prescribed: 28
Plan Total Fractions Prescribed: 28
Plan Total Prescribed Dose: 70 Gy
Plan Total Prescribed Dose: 70 Gy
Reference Point Dosage Given to Date: 65 Gy
Reference Point Dosage Given to Date: 67.5 Gy
Reference Point Session Dosage Given: 2.5 Gy
Reference Point Session Dosage Given: 2.5 Gy
Session Number: 26
Session Number: 27

## 2023-08-17 ENCOUNTER — Ambulatory Visit: Payer: BC Managed Care – PPO

## 2023-08-17 ENCOUNTER — Ambulatory Visit
Admission: RE | Admit: 2023-08-17 | Discharge: 2023-08-17 | Disposition: A | Discharge status: Home / Self Care | Payer: BC Managed Care – PPO | Source: Ambulatory Visit | Attending: Radiation Oncology | Admitting: Radiation Oncology

## 2023-08-17 ENCOUNTER — Other Ambulatory Visit: Payer: Self-pay

## 2023-08-17 DIAGNOSIS — C61 Malignant neoplasm of prostate: Secondary | ICD-10-CM

## 2023-08-17 DIAGNOSIS — Z510 Encounter for antineoplastic radiation therapy: Secondary | ICD-10-CM | POA: Diagnosis not present

## 2023-08-17 LAB — RAD ONC ARIA SESSION SUMMARY
Course Elapsed Days: 37
Plan Fractions Treated to Date: 28
Plan Prescribed Dose Per Fraction: 2.5 Gy
Plan Total Fractions Prescribed: 28
Plan Total Prescribed Dose: 70 Gy
Reference Point Dosage Given to Date: 70 Gy
Reference Point Session Dosage Given: 2.5 Gy
Session Number: 28

## 2023-08-18 ENCOUNTER — Ambulatory Visit: Payer: BC Managed Care – PPO

## 2023-08-18 NOTE — Radiation Completion Notes (Addendum)
Radiation Oncology         [phone] ________________________________  Name: Kenneth [name] MRN: [id number]  Date: 08/17/2023  DOB: 1956/07/17  Patient Name: Kenneth [name], Kenneth [name] MRN: [id number] Date of Birth: 12-02-1956 Referring Physician: Modena Slater, M.D. Date of Service: 2023-08-18 Radiation Oncologist: Margaretmary Bayley, M.D. [facility]     RADIATION ONCOLOGY END OF TREATMENT NOTE     Diagnosis:  67 y.o. gentleman with Stage T1c adenocarcinoma of the prostate with Gleason score of 3+4, and PSA of 4.55.   Intent: Curative     ==========DELIVERED PLANS==========  First Treatment Date: 2023-07-11 - Last Treatment Date: 2023-08-17   Plan Name: Prostate Site: Prostate Technique: IMRT Mode: Photon Dose Per Fraction: 2.5 Gy Prescribed Dose (Delivered / Prescribed): 70 Gy / 70 Gy Prescribed Fxs (Delivered / Prescribed): 28 / 28     ==========ON TREATMENT VISIT DATES========== 2023-07-15, 2023-07-22, 2023-07-28, 2023-08-05, 2023-08-12, 2023-08-17   See weekly On Treatment Notes in Epic for details.  He tolerated the treatments relatively well with increased nocturia and modest fatigue.  The patient will receive a call in about one month from the radiation oncology department. He will continue follow up with his urologist, Dr. Alvester Morin, as well.  He is currently scheduled for a follow-up visit in the urology office on 11/10/2023.  ------------------------------------------------   Margaretmary Dys, MD [facility]  Radiation Oncology Direct Dial: [phone]  Fax: [phone] [email or web address]  Skype  LinkedIn

## 2023-08-19 ENCOUNTER — Ambulatory Visit: Payer: BC Managed Care – PPO

## 2023-08-22 ENCOUNTER — Other Ambulatory Visit: Payer: Self-pay | Admitting: Endocrinology

## 2023-08-29 NOTE — Progress Notes (Signed)
Patient was a RadOnc Consult on 04/13/23 for his stage T1c adenocarcinoma of the prostate with Gleason score of 3+4, and PSA of 4.55. Patient proceed with treatment recommendations of 5.5 weeks of external radiation and had his final radiation treatment on 08/18/23.   Patient is scheduled for a post treatment nurse call on 10/11/23 and is now scheduled for post treatment follow up with Dr. Alvester Morin at [facility] on 11/10/23 @ 9:30am.   RN spoke with patient and provided appointment information.  Educated patient on post treatment PSA monitoring.  All questions answered.  No additional needs at this time.

## 2023-09-10 ENCOUNTER — Other Ambulatory Visit: Payer: Self-pay | Admitting: Family Medicine

## 2023-09-23 ENCOUNTER — Other Ambulatory Visit: Payer: Self-pay | Admitting: Endocrinology

## 2023-09-23 DIAGNOSIS — E1165 Type 2 diabetes mellitus with hyperglycemia: Secondary | ICD-10-CM

## 2023-09-29 ENCOUNTER — Other Ambulatory Visit: Payer: Self-pay | Admitting: Urology

## 2023-09-29 DIAGNOSIS — C61 Malignant neoplasm of prostate: Secondary | ICD-10-CM

## 2023-09-29 NOTE — Addendum Note (Signed)
Encounter addended by: Marcello Fennel, PA-C on: 09/29/2023 3:42 PM  Actions taken: Clinical Note Signed, Visit diagnoses modified

## 2023-10-11 ENCOUNTER — Ambulatory Visit
Admission: RE | Admit: 2023-10-11 | Discharge: 2023-10-11 | Disposition: A | Discharge status: Home / Self Care | Payer: BC Managed Care – PPO | Source: Ambulatory Visit | Attending: Internal Medicine | Admitting: Internal Medicine

## 2023-10-11 NOTE — Progress Notes (Signed)
Radiation Oncology         [phone]) [phone] ________________________________  Name: Kenneth [name] MRN: [id number]  Date of Service: 10/11/2023  DOB: 11/22/1956  Post Treatment Telephone Note  Diagnosis:  66 y.o. gentleman with Stage T1c adenocarcinoma of the prostate with Gleason score of 3+4, and PSA of 4.55. (as documented in provider EOT note)  Pre Treatment IPSS Score: 5 (as documented in the provider consult note)  The patient was available for call today.   Symptoms of fatigue have improved since completing therapy.  Symptoms of bladder changes have improved since completing therapy. Current symptoms include dysuria 3/10, and medications for bladder symptoms include none.  Symptoms of bowel changes have improved since completing therapy. Current symptoms include none, and medications for bowel symptoms include none.   Post Treatment IPSS Score: IPSS Questionnaire (AUA-7): Over the past month.   1)  How often have you had a sensation of not emptying your bladder completely after you finish urinating?  3 - About half the time  2)  How often have you had to urinate again less than two hours after you finished urinating? 1 - Less than 1 time in 5  3)  How often have you found you stopped and started again several times when you urinated?  0 - Not at all  4) How difficult have you found it to postpone urination?  3 - About half the time  5) How often have you had a weak urinary stream?  1 - Less than 1 time in 5  6) How often have you had to push or strain to begin urination?  0 - Not at all  7) How many times did you most typically get up to urinate from the time you went to bed until the time you got up in the morning?  2 - 2 times  Total score:  10. Which indicates mild symptoms  0-7 mildly symptomatic   8-19 moderately symptomatic   20-35 severely symptomatic    Patient has a scheduled follow up visit with his urologist, Dr. Alvester Morin, on 12/2023  for ongoing surveillance. He  was counseled that PSA levels will be drawn in the urology office, and was reassured that additional time is expected to improve bowel and bladder symptoms. He was encouraged to call back with concerns or questions regarding radiation.  This concludes the interaction.  Ruel Favors, LPN

## 2023-10-13 ENCOUNTER — Encounter: Payer: Self-pay | Admitting: *Deleted

## 2023-10-20 ENCOUNTER — Encounter: Payer: Self-pay | Admitting: *Deleted

## 2023-10-20 ENCOUNTER — Inpatient Hospital Stay: Payer: BC Managed Care – PPO | Attending: Adult Health | Admitting: *Deleted

## 2023-10-20 DIAGNOSIS — C61 Malignant neoplasm of prostate: Secondary | ICD-10-CM

## 2023-10-20 NOTE — Progress Notes (Signed)
SCP reviewed and completed. Pt will see [facility] for post -tx PSA  labs in November. Last colonoscopy was 5/20215. Pt is due 2025. I will send a copy of  pt's prostate cancer information to PCP, Dr. Gershon Crane.

## 2023-10-26 ENCOUNTER — Other Ambulatory Visit: Payer: Self-pay | Admitting: Endocrinology

## 2023-10-26 DIAGNOSIS — E1165 Type 2 diabetes mellitus with hyperglycemia: Secondary | ICD-10-CM

## 2023-10-26 NOTE — Telephone Encounter (Signed)
Kenneth [name] refill request complete

## 2023-11-01 ENCOUNTER — Ambulatory Visit: Payer: BC Managed Care – PPO | Admitting: Family Medicine

## 2023-11-01 ENCOUNTER — Encounter: Payer: Self-pay | Admitting: Family Medicine

## 2023-11-01 VITALS — BP 118/78 | HR 70 | Temp 98.3°F | Wt 226.0 lb

## 2023-11-01 DIAGNOSIS — D171 Benign lipomatous neoplasm of skin and subcutaneous tissue of trunk: Secondary | ICD-10-CM | POA: Diagnosis not present

## 2023-11-01 NOTE — Progress Notes (Signed)
   Subjective:    Patient ID: Kenneth [name], male    DOB: 1956/07/05, 67 y.o.   MRN: [id number]  HPI Here to check on a lump on his back. This has been there for several years and it is slowly getting larger. This is not painful however and does not other him. He has some similar lumps on the left thigh.    Review of Systems  Constitutional: Negative.   Respiratory: Negative.    Cardiovascular: Negative.        Objective:   Physical Exam Constitutional:      Appearance: Normal appearance.  Cardiovascular:     Rate and Rhythm: Normal rate and regular rhythm.     Pulses: Normal pulses.     Heart sounds: Normal heart sounds.  Pulmonary:     Effort: Pulmonary effort is normal.     Breath sounds: Normal breath sounds.  Skin:    Comments: There are 3 firm, mobile, non-tender masses just under the skin. The largest is on the right lower back. There are 2 smaller ones on the medial left thigh   Neurological:     Mental Status: He is alert.           Assessment & Plan:  These are lipomas, and I explained that they are benign. Since they are not symptomatic we will simply observe them.  Gershon Crane, MD

## 2023-11-02 ENCOUNTER — Ambulatory Visit: Payer: BC Managed Care – PPO | Admitting: Endocrinology

## 2023-11-09 ENCOUNTER — Encounter: Payer: Self-pay | Admitting: Endocrinology

## 2023-11-09 ENCOUNTER — Ambulatory Visit (INDEPENDENT_AMBULATORY_CARE_PROVIDER_SITE_OTHER): Payer: BC Managed Care – PPO | Admitting: Endocrinology

## 2023-11-09 VITALS — BP 124/60 | HR 65 | Resp 20 | Ht 71.0 in | Wt 229.2 lb

## 2023-11-09 DIAGNOSIS — Z794 Long term (current) use of insulin: Secondary | ICD-10-CM

## 2023-11-09 DIAGNOSIS — E1165 Type 2 diabetes mellitus with hyperglycemia: Secondary | ICD-10-CM | POA: Diagnosis not present

## 2023-11-09 LAB — MICROALBUMIN / CREATININE URINE RATIO
Creatinine,U: 39.3 mg/dL
Microalb Creat Ratio: 2.9 mg/g (ref 0.0–30.0)
Microalb, Ur: 1.2 mg/dL (ref 0.0–1.9)

## 2023-11-09 LAB — BASIC METABOLIC PANEL
BUN: 18 mg/dL (ref 6–23)
CO2: 27 meq/L (ref 19–32)
Calcium: 9.4 mg/dL (ref 8.4–10.5)
Chloride: 104 meq/L (ref 96–112)
Creatinine, Ser: 0.98 mg/dL (ref 0.40–1.50)
GFR: 81.26 mL/min (ref >60.00)
Glucose, Bld: 93 mg/dL (ref 70–99)
Potassium: 3.8 meq/L (ref 3.5–5.1)
Sodium: 136 meq/L (ref 135–145)

## 2023-11-09 LAB — LIPID PANEL
Cholesterol: 114 mg/dL (ref 0–200)
HDL: 34.8 mg/dL — ABNORMAL LOW (ref >39.00)
LDL Cholesterol: 64 mg/dL (ref 0–99)
NonHDL: 79.63
Total CHOL/HDL Ratio: 3
Triglycerides: 78 mg/dL (ref 0.0–149.0)
VLDL: 15.6 mg/dL (ref 0.0–40.0)

## 2023-11-09 LAB — POCT GLYCOSYLATED HEMOGLOBIN (HGB A1C): Hemoglobin A1C: 6.8 % — AB (ref 4.0–5.6)

## 2023-11-09 NOTE — Progress Notes (Signed)
Outpatient Endocrinology Note [place] Emillio Ngo, MD  11/09/23  Patient's Name: Kenneth [name]    DOB: Oct 07, 1956    MRN: [id number]                                                    REASON OF VISIT: Follow up  for type 2 diabetes mellitus  PCP: [name], MD  HISTORY OF PRESENT ILLNESS:   Kenneth [name] is a 67 y.o. old male with past medical history listed below, is here for follow up of type 2 diabetes mellitus.   Pertinent Diabetes History:  _Diagnosed as type 2 in 2008.  He has history of diabetic ketoacidosis in the past around 2011.  He was initially treated with metformin and then insulin therapy was started soon after diagnosis.  Chronic Diabetes Complications : Retinopathy: unknown. Last ophthalmology exam was done on due Nephropathy: no Peripheral neuropathy: no Coronary artery disease: no Stroke: no  Relevant comorbidities and cardiovascular risk factors: Obesity: yes Body mass index is 31.97 kg/m.  Hypertension: yes Hyperlipidemia. Yes, on a statin.  Current / Home Diabetic regimen includes: Tresiba 50 units daily. NovoLog 8 to 14 units with breakfast, lunch and supper depending upon the meal and blood sugar. Jardiance 25 mg daily.  Prior diabetic medications: Metformin stopped due to GI intolerance.  Glycemic data:    CONTINUOUS GLUCOSE MONITORING SYSTEM (CGMS) INTERPRETATION: At today's visit, we reviewed CGM downloads. The full report is scanned in the media. Reviewing the CGM trends, blood glucose are as follows:  Dexcom G7 CGM-  Sensor Download (Sensor download was reviewed and summarized below.) Dates: November 7 to November 20 , 2024 last 14 days  Glucose Management Indicator: 7.6% Sensor Average: 180 SD 62 Sensor usage 93%  Glycemic Trends:  <54: 0% 54-70: 0% 71-180: 53% 181-250: 32% 251-400: 15%  Interpretation: Frequent postprandial hyperglycemia, especially at bedtime with blood sugar up to 300 range due to bedtime  snacks/candy.  Trending down blood sugar overnight and by the early morning.  Blood sugar in between the meals acceptable.  No hypoglycemia.  Hypoglycemia: Patient has minor hypoglycemic episodes. Patient has hypoglycemia awareness.  Factors modifying glucose control: 1.  Diabetic diet assessment: Lately has been eating small meals, especially with lunch and supper.  Bedtime snacks.  2.  Staying active or exercising: Active at work.  3.  Medication compliance: compliant all of the time.  Interval history 11/09/23 CGM data as reviewed above.  Diabetes regimen as noted above.  Hemoglobin A1c today 6.8%.  Blood sugar mostly postprandial hyperglycemia including at bedtime.  He has no complaints today.  He states he is eating bedtime candies, has been cutting down and trying to avoid.  REVIEW OF SYSTEMS As per history of present illness.   PAST MEDICAL HISTORY: Past Medical History:  Diagnosis Date   Diabetes mellitus type 2    sees Dr. Romero Belling    Hyperlipidemia    Prostate cancer [facility])     PAST SURGICAL HISTORY: Past Surgical History:  Procedure Laterality Date   COLONOSCOPY  05/06/2014   per Dr. Marina Goodell, clear, repeat in 10 yrs    GOLD SEED IMPLANT N/A 06/16/2023   Procedure: GOLD SEED IMPLANT;  Surgeon: Heloise Purpura, MD;  Location[facility];  Service: Urology;  Laterality: N/A;   LAPAROSCOPIC APPENDECTOMY N/A  04/06/2017   Procedure: APPENDECTOMY LAPAROSCOPIC with primary repair of umbilical hernia;  Surgeon: Violeta Gelinas, MD;  Location: [facility] OR;  Service: General;  Laterality: N/A;   PROSTATE BIOPSY     SPACE OAR INSTILLATION N/A 06/16/2023   Procedure: SPACE OAR INSTILLATION;  Surgeon: Heloise Purpura, MD;  Location[facility];  Service: Urology;  Laterality: N/A;    ALLERGIES: Allergies  Allergen Reactions   Metformin And Related Nausea And Vomiting    FAMILY HISTORY:  Family History  Problem Relation Age of Onset   Breast cancer  Mother    Diabetes Father    Hypertension Father    Alcohol abuse Father    Diabetes Sister    Colon cancer Neg Hx    Pancreatic cancer Neg Hx    Stomach cancer Neg Hx     SOCIAL HISTORY: Social History   Socioeconomic History   Marital status: Single    Spouse name: Not on file   Number of children: Not on file   Years of education: Not on file   Highest education level: Not on file  Occupational History   Not on file  Tobacco Use   Smoking status: Former    Current packs/day: 0.00    Types: Cigarettes    Quit date: 01/30/2012    Years since quitting: 11.7   Smokeless tobacco: Never  Vaping Use   Vaping status: Never Used  Substance and Sexual Activity   Alcohol use: Not Currently   Drug use: No   Sexual activity: Not on file  Other Topics Concern   Not on file  Social History Narrative   Not on file   Social Determinants of Health   Financial Resource Strain: Not on file  Food Insecurity: No Food Insecurity (04/13/2023)   Hunger Vital Sign    Worried About Running Out of Food in the Last Year: Never true    Ran Out of Food in the Last Year: Never true  Transportation Needs: No Transportation Needs (04/13/2023)   PRAPARE - Administrator, Civil Service (Medical): No    Lack of Transportation (Non-Medical): No  Physical Activity: Not on file  Stress: Not on file  Social Connections: Not on file    MEDICATIONS:  Current Outpatient Medications  Medication Sig Dispense Refill   acetaminophen (TYLENOL) 500 MG tablet Take 1,000 mg by mouth every 6 (six) hours as needed.     Continuous Glucose Sensor (DEXCOM G7 SENSOR) MISC CHANGE SENSOR EVERY 10 DAYS AS DIRECTED 3 each 3   empagliflozin (JARDIANCE) 25 MG TABS tablet TAKE 1 TABLET BY MOUTH ONCE DAILY BEFORE BREAKFAST 30 tablet 3   glucose blood (ONETOUCH VERIO) test strip 1 each by Other route 2 (two) times daily. And lancets 2/day. 100 each 12   NOVOLOG FLEXPEN 100 UNIT/ML FlexPen INJECT 8 UNITS  SUBCUTANEOUSLY IN THE MORNING AND 12 AT LUNCH AND 15 AT SUPPER (Patient taking differently: INJECT 8 UNITS SUBCUTANEOUSLY IN THE MORNING AND 12 AT LUNCH AND 14 AT SUPPER) 15 mL 0   omeprazole (PRILOSEC) 40 MG capsule Take 1 capsule (40 mg total) by mouth daily. 90 capsule 3   simvastatin (ZOCOR) 20 MG tablet TAKE 1 TABLET BY MOUTH AT BEDTIME 90 tablet 0   TRESIBA FLEXTOUCH 200 UNIT/ML FlexTouch Pen INJECT 50 UNITS SUBCUTANEOUSLY ONCE DAILY 9 mL 0   No current facility-administered medications for this visit.    PHYSICAL EXAM: Vitals:   11/09/23 0820  BP: 124/60  Pulse:  65  Resp: 20  SpO2: 97%  Weight: 229 lb 3.2 oz (104 kg)  Height: 5\' 11"  (1.803 m)    Body mass index is 31.97 kg/m.  Wt Readings from Last 3 Encounters:  11/09/23 229 lb 3.2 oz (104 kg)  11/01/23 226 lb (102.5 kg)  08/02/23 223 lb 3.2 oz (101.2 kg)    General: Well developed, well nourished male in no apparent distress.  HEENT: AT/[place], no external lesions.  Eyes: Conjunctiva clear and no icterus. Neck: Neck supple  Lungs: Respirations not labored Neurologic: Alert, oriented, normal speech Extremities / Skin: Dry. No sores or rashes noted.  Psychiatric: Does not appear depressed or anxious  Diabetic Foot Exam - Simple   No data filed    LABS Reviewed Lab Results  Component Value Date   HGBA1C 6.8 (A) 11/09/2023   HGBA1C 6.1 (A) 08/02/2023   HGBA1C 7.3 (H) 03/29/2023   Lab Results  Component Value Date   FRUCTOSAMINE 319 (H) 12/27/2022   FRUCTOSAMINE 391 (H) 11/03/2021   FRUCTOSAMINE 230 11/19/2019   Lab Results  Component Value Date   CHOL 122 01/04/2023   HDL 35.50 (L) 01/04/2023   LDLCALC 74 01/04/2023   TRIG 64.0 01/04/2023   CHOLHDL 3 01/04/2023   Lab Results  Component Value Date   MICRALBCREAT 7.3 06/03/2022   MICRALBCREAT 0.9 08/02/2016   Lab Results  Component Value Date   CREATININE 1.10 06/16/2023   Lab Results  Component Value Date   GFR 82.62 03/29/2023    ASSESSMENT /  PLAN  1. Type 2 diabetes mellitus with hyperglycemia, with long-term current use of insulin (HCC)      Diabetes Mellitus type 2, complicated by no known complications. - Diabetic status / severity: Controlled.  Lab Results  Component Value Date   HGBA1C 6.8 (A) 11/09/2023    - Hemoglobin A1c goal : <7%  - Medications: No change today.  Patient is advised to avoid bedtime snack, stop eating candy at bedtime.  Discussed about portion control and avoiding high carb meals.  -Continue tresiba to 50 units daily.  - Novolog 8 units with breakfast but for larger breakfast may take upto 14 units. Lunch 5-14 units and supper 5-14 units depending upon meal size.  Continue jardiance 25 mg daily.   - Home glucose testing: CGM and check blood sugar as needed. - Discussed/ Gave Hypoglycemia treatment plan.  # Consult : not required at this time.   # Annual urine for microalbuminuria/ creatinine ratio, no microalbuminuria currently.  Will check today.  Last  Lab Results  Component Value Date   MICRALBCREAT 7.3 06/03/2022    # Foot check nightly / neuropathy.  # Annual dilated diabetic eye exams.  Advised to have diabetic eye exam.  - Diet: Make healthy diabetic food choices - Life style / activity / exercise: discussed.  2. Blood pressure  -  BP Readings from Last 1 Encounters:  11/09/23 124/60    - Control is in target.  - No change in current plans.  3. Lipid status / Hyperlipidemia - Last  Lab Results  Component Value Date   LDLCALC 74 01/04/2023   - Continue simvastatin 40 mg daily.   -Will check lipid panel today.  Diagnoses and all orders for this visit:  Type 2 diabetes mellitus with hyperglycemia, with long-term current use of insulin (HCC) -     POCT glycosylated hemoglobin (Hb A1C) -     Lipid panel; Future -     Microalbumin /  creatinine urine ratio; Future -     Basic metabolic panel; Future     DISPOSITION Follow up in clinic in 3  months  suggested.   All questions answered and patient verbalized understanding of the plan.  [place] Lavana Huckeba, MD [facility] Endocrinology [facility] [address] [place], [address] [place], [place] [address] Phone # [phone]  At least part of this note was generated using voice recognition software. Inadvertent word errors may have occurred, which were not recognized during the proofreading process.

## 2023-11-26 ENCOUNTER — Other Ambulatory Visit: Payer: Self-pay | Admitting: Endocrinology

## 2023-11-26 DIAGNOSIS — E1165 Type 2 diabetes mellitus with hyperglycemia: Secondary | ICD-10-CM

## 2023-11-28 ENCOUNTER — Other Ambulatory Visit: Payer: Self-pay

## 2023-11-28 DIAGNOSIS — E1165 Type 2 diabetes mellitus with hyperglycemia: Secondary | ICD-10-CM

## 2023-11-28 MED ORDER — NOVOLOG FLEXPEN 100 UNIT/ML ~~LOC~~ SOPN: PEN_INJECTOR | SUBCUTANEOUS | 0 refills | Status: DC

## 2023-12-09 ENCOUNTER — Other Ambulatory Visit: Payer: Self-pay | Admitting: Family Medicine

## 2023-12-18 ENCOUNTER — Other Ambulatory Visit: Payer: Self-pay | Admitting: Family Medicine

## 2023-12-27 ENCOUNTER — Other Ambulatory Visit: Payer: Self-pay | Admitting: Internal Medicine

## 2024-01-05 ENCOUNTER — Other Ambulatory Visit: Payer: Self-pay | Admitting: Endocrinology

## 2024-01-05 DIAGNOSIS — E1165 Type 2 diabetes mellitus with hyperglycemia: Secondary | ICD-10-CM

## 2024-01-29 ENCOUNTER — Other Ambulatory Visit: Payer: Self-pay | Admitting: Internal Medicine

## 2024-02-01 ENCOUNTER — Other Ambulatory Visit: Payer: Self-pay

## 2024-02-01 DIAGNOSIS — Z794 Long term (current) use of insulin: Secondary | ICD-10-CM

## 2024-02-01 DIAGNOSIS — E1165 Type 2 diabetes mellitus with hyperglycemia: Secondary | ICD-10-CM

## 2024-02-01 MED ORDER — EMPAGLIFLOZIN 25 MG PO TABS: 25.0000 mg | ORAL_TABLET | Freq: Every day | ORAL | 3 refills | Status: DC

## 2024-02-07 ENCOUNTER — Ambulatory Visit: Payer: 59 | Admitting: Endocrinology

## 2024-02-07 ENCOUNTER — Encounter: Payer: Self-pay | Admitting: Endocrinology

## 2024-02-07 VITALS — BP 134/80 | HR 72 | Resp 20 | Ht 71.0 in | Wt 228.6 lb

## 2024-02-07 DIAGNOSIS — E1165 Type 2 diabetes mellitus with hyperglycemia: Secondary | ICD-10-CM

## 2024-02-07 DIAGNOSIS — Z794 Long term (current) use of insulin: Secondary | ICD-10-CM | POA: Diagnosis not present

## 2024-02-07 LAB — POCT GLYCOSYLATED HEMOGLOBIN (HGB A1C): Hemoglobin A1C: 6.6 % — AB (ref 4.0–5.6)

## 2024-02-07 MED ORDER — TRESIBA FLEXTOUCH 200 UNIT/ML ~~LOC~~ SOPN: PEN_INJECTOR | SUBCUTANEOUS | 4 refills | Status: DC

## 2024-02-07 MED ORDER — EMPAGLIFLOZIN 25 MG PO TABS: 25.0000 mg | ORAL_TABLET | Freq: Every day | ORAL | 3 refills | Status: DC

## 2024-02-07 MED ORDER — DEXCOM G7 SENSOR MISC: 3 refills | Status: DC

## 2024-02-07 MED ORDER — NOVOLOG FLEXPEN 100 UNIT/ML ~~LOC~~ SOPN: PEN_INJECTOR | SUBCUTANEOUS | 4 refills | Status: AC

## 2024-02-07 NOTE — Patient Instructions (Signed)
 Decrease tresiba to 45 units daily.  Novolog 8 units with breakfast but for larger breakfast may take upto 14 units. Lunch 5-12 units and supper 5 units depending upon meal size.  Continue jardiance.

## 2024-02-07 NOTE — Progress Notes (Signed)
 Outpatient Endocrinology Note [name], MD  02/07/24  Patient's Name: Kenneth [name]    DOB: 10/10/1956    MRN: [id number]                                                    REASON OF VISIT: Follow up  for type 2 diabetes mellitus  PCP: [name], MD  HISTORY OF PRESENT ILLNESS:   Kenneth [name] is a 68 y.o. old male with past medical history listed below, is here for follow up of type 2 diabetes mellitus.   Pertinent Diabetes History:  _Diagnosed as type 2 in 2008.  He has history of diabetic ketoacidosis in the past around 2011.  He was initially treated with metformin and then insulin therapy was started soon after diagnosis.  Chronic Diabetes Complications : Retinopathy: unknown. Last ophthalmology exam was done on due Nephropathy: no Peripheral neuropathy: no Coronary artery disease: no Stroke: no  Relevant comorbidities and cardiovascular risk factors: Obesity: yes Body mass index is 31.88 kg/m.  Hypertension: yes Hyperlipidemia. Yes, on a statin.  Current / Home Diabetic regimen includes: Tresiba 50 units daily. NovoLog 8 units with breakfast, 12 with lunch and none at supper. Jardiance 25 mg daily.  Prior diabetic medications: Metformin stopped due to GI intolerance.  Glycemic data:    CONTINUOUS GLUCOSE MONITORING SYSTEM (CGMS) INTERPRETATION: At today's visit, we reviewed CGM downloads. The full report is scanned in the media. Reviewing the CGM trends, blood glucose are as follows:  Dexcom G7 CGM-  Sensor Download (Sensor download was reviewed and summarized below.) Dates: February 5 to February 07, 2024 last 14 days  Glucose Management Indicator: 7.8%  Sensor usage 95%    Interpretation: Almost every day hyperglycemia at bedtime with blood sugar up to 300 range, and hyperglycemia remained overnight, related to bedtime snack and no mealtime insulin at suppertime.  Blood sugar trending down by the morning and rare hypoglycemia mild in the  morning fasting.  Blood sugar mostly acceptable during the daytime.  Occasional mild hyperglycemia with blood sugar up to 250 range  with lunch.  No significant hypoglycemia.  Hypoglycemia: Patient has minor hypoglycemic episodes. Patient has hypoglycemia awareness.  Factors modifying glucose control: 1.  Diabetic diet assessment: Lately has been eating small meals, especially with lunch and supper.  Bedtime snacks almost every day with cakes, candies.  2.  Staying active or exercising: Active at work.  3.  Medication compliance: compliant all of the time.  Interval history Diabetes regimen as reviewed and noted above.  Dexcom G7 CGM data reviewed and as noted above.  Patient admits that he has been eating bedtime snacks with candy and cakes almost every day.  He does not take NovoLog with supper and bedtime snack.  Hemoglobin A1c today 6.6%.  No other complaints today.   REVIEW OF SYSTEMS As per history of present illness.   PAST MEDICAL HISTORY: Past Medical History:  Diagnosis Date   Diabetes mellitus type 2    sees Dr. Romero Belling    Hyperlipidemia    Prostate cancer [facility])     PAST SURGICAL HISTORY: Past Surgical History:  Procedure Laterality Date   COLONOSCOPY  05/06/2014   per Dr. Marina Goodell, clear, repeat in 10 yrs    GOLD SEED IMPLANT N/A 06/16/2023   Procedure: GOLD SEED IMPLANT;  Surgeon: Heloise Purpura, MD;  Location: [facility];  Service: Urology;  Laterality: N/A;   LAPAROSCOPIC APPENDECTOMY N/A 04/06/2017   Procedure: APPENDECTOMY LAPAROSCOPIC with primary repair of umbilical hernia;  Surgeon: Violeta Gelinas, MD;  Location: [facility] OR;  Service: General;  Laterality: N/A;   PROSTATE BIOPSY     SPACE OAR INSTILLATION N/A 06/16/2023   Procedure: SPACE OAR INSTILLATION;  Surgeon: Heloise Purpura, MD;  Location: [facility];  Service: Urology;  Laterality: N/A;    ALLERGIES: Allergies  Allergen Reactions   Metformin And Related Nausea And  Vomiting    FAMILY HISTORY:  Family History  Problem Relation Age of Onset   Breast cancer Mother    Diabetes Father    Hypertension Father    Alcohol abuse Father    Diabetes Sister    Colon cancer Neg Hx    Pancreatic cancer Neg Hx    Stomach cancer Neg Hx     SOCIAL HISTORY: Social History   Socioeconomic History   Marital status: Single    Spouse name: Not on file   Number of children: Not on file   Years of education: Not on file   Highest education level: Not on file  Occupational History   Not on file  Tobacco Use   Smoking status: Former    Current packs/day: 0.00    Types: Cigarettes    Quit date: 01/30/2012    Years since quitting: 12.0   Smokeless tobacco: Never  Vaping Use   Vaping status: Never Used  Substance and Sexual Activity   Alcohol use: Not Currently   Drug use: No   Sexual activity: Not on file  Other Topics Concern   Not on file  Social History Narrative   Not on file   Social Drivers of Health   Financial Resource Strain: Not on file  Food Insecurity: No Food Insecurity (04/13/2023)   Hunger Vital Sign    Worried About Running Out of Food in the Last Year: Never true    Ran Out of Food in the Last Year: Never true  Transportation Needs: No Transportation Needs (04/13/2023)   PRAPARE - Administrator, Civil Service (Medical): No    Lack of Transportation (Non-Medical): No  Physical Activity: Not on file  Stress: Not on file  Social Connections: Not on file    MEDICATIONS:  Current Outpatient Medications  Medication Sig Dispense Refill   acetaminophen (TYLENOL) 500 MG tablet Take 1,000 mg by mouth every 6 (six) hours as needed.     glucose blood (ONETOUCH VERIO) test strip 1 each by Other route 2 (two) times daily. And lancets 2/day. 100 each 12   omeprazole (PRILOSEC) 40 MG capsule Take 1 capsule by mouth once daily 90 capsule 0   simvastatin (ZOCOR) 20 MG tablet TAKE 1 TABLET BY MOUTH AT BEDTIME 90 tablet 0    Continuous Glucose Sensor (DEXCOM G7 SENSOR) MISC CHANGE SENSOR EVERY 10 DAYS AS DIRECTED 9 each 3   empagliflozin (JARDIANCE) 25 MG TABS tablet Take 1 tablet (25 mg total) by mouth daily before breakfast. 90 tablet 3   NOVOLOG FLEXPEN 100 UNIT/ML FlexPen INJECT 8 UNITS SUBCUTANEOUSLY IN THE MORNING AND 12 units AT LUNCH AND 5 units AT SUPPER 15 mL 4   TRESIBA FLEXTOUCH 200 UNIT/ML FlexTouch Pen INJECT 50 UNITS SUBCUTANEOUSLY ONCE DAILY 18 mL 4   No current facility-administered medications for this visit.    PHYSICAL EXAM: Vitals:   02/07/24 4401  BP: 134/80  Pulse: 72  Resp: 20  SpO2: 98%  Weight: 228 lb 9.6 oz (103.7 kg)  Height: 5\' 11"  (1.803 m)     Body mass index is 31.88 kg/m.  Wt Readings from Last 3 Encounters:  02/07/24 228 lb 9.6 oz (103.7 kg)  11/09/23 229 lb 3.2 oz (104 kg)  11/01/23 226 lb (102.5 kg)    General: Well developed, well nourished male in no apparent distress.  HEENT: AT/[place], no external lesions.  Eyes: Conjunctiva clear and no icterus. Neck: Neck supple  Lungs: Respirations not labored Neurologic: Alert, oriented, normal speech Extremities / Skin: Dry. No sores or rashes noted.  Psychiatric: Does not appear depressed or anxious  Diabetic Foot Exam - Simple   No data filed    LABS Reviewed Lab Results  Component Value Date   HGBA1C 6.6 (A) 02/07/2024   HGBA1C 6.8 (A) 11/09/2023   HGBA1C 6.1 (A) 08/02/2023   Lab Results  Component Value Date   FRUCTOSAMINE 319 (H) 12/27/2022   FRUCTOSAMINE 391 (H) 11/03/2021   FRUCTOSAMINE 230 11/19/2019   Lab Results  Component Value Date   CHOL 114 11/09/2023   HDL 34.80 (L) 11/09/2023   LDLCALC 64 11/09/2023   TRIG 78.0 11/09/2023   CHOLHDL 3 11/09/2023   Lab Results  Component Value Date   MICRALBCREAT 2.9 11/09/2023   MICRALBCREAT 7.3 06/03/2022   Lab Results  Component Value Date   CREATININE 0.98 11/09/2023   Lab Results  Component Value Date   GFR 81.26 11/09/2023    ASSESSMENT  / PLAN  1. Type 2 diabetes mellitus with hyperglycemia, with long-term current use of insulin (HCC)   2. Uncontrolled type 2 diabetes mellitus with hyperglycemia, with long-term current use of insulin (HCC)     Diabetes Mellitus type 2, complicated by no known complications. - Diabetic status / severity: Controlled.  Lab Results  Component Value Date   HGBA1C 6.6 (A) 02/07/2024    - Hemoglobin A1c goal : <6.5%  - Medications:  Patient is advised to avoid bedtime snack, stop eating candy at bedtime.  Discussed about portion control and avoiding high carb meals.  -Decreased tresiba from 50 to 45 units daily.  - Novolog 8 units with breakfast but for larger breakfast may take upto 14 units. Lunch 5-12 units and start taking NovoLog 5 units at supper.  Advised to stop taking bedtime snacks especially candies and cakes.  Continue jardiance 25 mg daily.   - Home glucose testing: CGM and check blood sugar as needed. - Discussed/ Gave Hypoglycemia treatment plan.  # Consult : not required at this time.   # Annual urine for microalbuminuria/ creatinine ratio, no microalbuminuria currently.    Last  Lab Results  Component Value Date   MICRALBCREAT 2.9 11/09/2023    # Foot check nightly / neuropathy.  # Annual dilated diabetic eye exams.  Advised to have diabetic eye exam.  - Diet: Make healthy diabetic food choices - Life style / activity / exercise: discussed.  2. Blood pressure  -  BP Readings from Last 1 Encounters:  02/07/24 134/80    - Control is in target.  - No change in current plans.  3. Lipid status / Hyperlipidemia - Last  Lab Results  Component Value Date   LDLCALC 64 11/09/2023   - Continue simvastatin 40 mg daily.   -Will check lipid panel today.  Diagnoses and all orders for this visit:  Type 2 diabetes mellitus with hyperglycemia, with long-term  current use of insulin (HCC) -     POCT glycosylated hemoglobin (Hb A1C) -     NOVOLOG FLEXPEN 100  UNIT/ML FlexPen; INJECT 8 UNITS SUBCUTANEOUSLY IN THE MORNING AND 12 units AT LUNCH AND 5 units AT SUPPER -     empagliflozin (JARDIANCE) 25 MG TABS tablet; Take 1 tablet (25 mg total) by mouth daily before breakfast. -     Continuous Glucose Sensor (DEXCOM G7 SENSOR) MISC; CHANGE SENSOR EVERY 10 DAYS AS DIRECTED  Uncontrolled type 2 diabetes mellitus with hyperglycemia, with long-term current use of insulin (HCC) -     TRESIBA FLEXTOUCH 200 UNIT/ML FlexTouch Pen; INJECT 50 UNITS SUBCUTANEOUSLY ONCE DAILY    DISPOSITION Follow up in clinic in 3  months suggested.   All questions answered and patient verbalized understanding of the plan.  [place] Griffin Gerrard, MD [facility] Endocrinology [facility] [address] [place], [address] [place], [place] [address] Phone # [phone]  At least part of this note was generated using voice recognition software. Inadvertent word errors may have occurred, which were not recognized during the proofreading process.

## 2024-03-12 ENCOUNTER — Other Ambulatory Visit: Payer: Self-pay | Admitting: Family Medicine

## 2024-04-16 ENCOUNTER — Ambulatory Visit: Admitting: Family Medicine

## 2024-04-16 ENCOUNTER — Encounter: Payer: Self-pay | Admitting: Family Medicine

## 2024-04-16 ENCOUNTER — Ambulatory Visit

## 2024-04-16 ENCOUNTER — Other Ambulatory Visit: Payer: Self-pay

## 2024-04-16 VITALS — BP 124/80 | HR 78 | Temp 98.8°F | Wt 226.0 lb

## 2024-04-16 DIAGNOSIS — R0602 Shortness of breath: Secondary | ICD-10-CM

## 2024-04-16 MED ORDER — OMEPRAZOLE 40 MG PO CPDR: 40.0000 mg | DELAYED_RELEASE_CAPSULE | Freq: Every day | ORAL | 0 refills | Status: DC

## 2024-04-16 NOTE — Progress Notes (Signed)
   Subjective:    Patient ID: Kenneth [name], male    DOB: December 10, 1956, 68 y.o.   MRN: [id number]  HPI Here asking for help with an issue. In 2008 he had a DWI charge, and he has been working to get his driving privileges back since then. The DMV recently said that in order for him to drive again, he would have to have a Breatholizer device installed that he would have to blow into before his car could start. When he tried this, however, the test did not work, apparently due to inadequate are flow they told him to see his doctor about this. He does say he has mild SOB at times, and he coughs more than he used to. He is a former smoker.    Review of Systems  Constitutional: Negative.   Respiratory:  Positive for cough and shortness of breath. Negative for wheezing.   Cardiovascular: Negative.        Objective:   Physical Exam Constitutional:      Appearance: Normal appearance.  Cardiovascular:     Rate and Rhythm: Normal rate and regular rhythm.     Pulses: Normal pulses.     Heart sounds: Normal heart sounds.  Pulmonary:     Effort: Pulmonary effort is normal.     Breath sounds: No wheezing, rhonchi or rales.     Comments: BS are quiet  Neurological:     Mental Status: He is alert.           Assessment & Plan:  He seems to have a problem with forced exhalations. This may be the early signs of COPD. We will get a CXR today, and we will refer him to Pulmonology for further evaluation.  Corita Diego, MD

## 2024-04-23 ENCOUNTER — Encounter: Payer: Self-pay | Admitting: *Deleted

## 2024-05-09 ENCOUNTER — Encounter: Payer: Self-pay | Admitting: Endocrinology

## 2024-05-09 ENCOUNTER — Ambulatory Visit: Payer: 59 | Admitting: Endocrinology

## 2024-05-09 ENCOUNTER — Telehealth: Payer: Self-pay | Admitting: Pharmacy Technician

## 2024-05-09 ENCOUNTER — Other Ambulatory Visit (HOSPITAL_COMMUNITY): Payer: Self-pay

## 2024-05-09 ENCOUNTER — Ambulatory Visit: Payer: Self-pay | Admitting: Endocrinology

## 2024-05-09 VITALS — BP 126/60 | HR 76 | Resp 20 | Ht 71.0 in | Wt 227.4 lb

## 2024-05-09 DIAGNOSIS — E1165 Type 2 diabetes mellitus with hyperglycemia: Secondary | ICD-10-CM | POA: Diagnosis not present

## 2024-05-09 DIAGNOSIS — Z794 Long term (current) use of insulin: Secondary | ICD-10-CM

## 2024-05-09 LAB — POCT GLYCOSYLATED HEMOGLOBIN (HGB A1C): Hemoglobin A1C: 7.5 % — AB (ref 4.0–5.6)

## 2024-05-09 MED ORDER — SEMAGLUTIDE(0.25 OR 0.5MG/DOS) 2 MG/3ML ~~LOC~~ SOPN: PEN_INJECTOR | SUBCUTANEOUS | 4 refills | Status: DC

## 2024-05-09 NOTE — Patient Instructions (Addendum)
  Tresiba  50 units daily. NovoLog  8 units with breakfast, 10 with lunch and none at supper. Jardiance  25 mg daily.  Start Ozempic 0.25 mg weekly for 4 weeks and then increase to 0.5 mg weekly.  After you have been on the Ozempic 0.5 mg weekly decrease Tresiba  to 45 units daily.

## 2024-05-09 NOTE — Progress Notes (Signed)
 Outpatient Endocrinology Note [place] Sacheen Arrasmith, MD  05/09/24  Patient's Name: Kenneth [name]    DOB: 1956-10-09    MRN: [id number]                                                    REASON OF VISIT: Follow up  for type 2 diabetes mellitus  PCP: Donley Furth, MD  HISTORY OF PRESENT ILLNESS:   Kenneth [name] is a 68 y.o. old male with past medical history listed below, is here for follow up of type 2 diabetes mellitus.   Pertinent Diabetes History:  _Diagnosed as type 2 in 2008.  He has history of diabetic ketoacidosis in the past around 2011.  He was initially treated with metformin  and then insulin  therapy was started soon after diagnosis.  Chronic Diabetes Complications : Retinopathy: unknown. Last ophthalmology exam was done on due Nephropathy: no Peripheral neuropathy: no Coronary artery disease: no Stroke: no  Relevant comorbidities and cardiovascular risk factors: Obesity: yes Body mass index is 31.72 kg/m.  Hypertension: yes Hyperlipidemia. Yes, on a statin.  Current / Home Diabetic regimen includes: Tresiba  50 units daily. NovoLog  8 units with breakfast, 10 with lunch and none at supper. Jardiance  25 mg daily.  Prior diabetic medications: Metformin  stopped due to GI intolerance.  Glycemic data:    CONTINUOUS GLUCOSE MONITORING SYSTEM (CGMS) INTERPRETATION: At today's visit, we reviewed CGM downloads. The full report is scanned in the media. Reviewing the CGM trends, blood glucose are as follows:  Dexcom G7 CGM-  Sensor Download (Sensor download was reviewed and summarized below.) Dates: May 8 to May 09, 2024 last 14 days  Glucose Management Indicator: 8.4%  Sensor usage 84%    Interpretation: Almost every day hyperglycemia starting around 9 PM during overnight related to last meal of the day when he is at work and not taking mealtime insulin .  Hyperglycemia up to the range of 250-350 range.  Blood sugar with breakfast and supper variable mostly mild  hyperglycemia followed by trending down of the blood sugar in the normal range related to mealtime insulin .  No hypoglycemia.  Hypoglycemia: Patient has no hypoglycemic episodes. Patient has hypoglycemia awareness.  Factors modifying glucose control: 1.  Diabetic diet assessment: Lately has been eating small meals, especially with lunch and supper.  Evening snacks almost every day with cakes, candies.  2.  Staying active or exercising: Active at work.  Works from 2 PM to 10 PM.  3.  Medication compliance: compliant all of the time.  Interval history CGM data as reviewed above.  Mostly postprandial hyperglycemia.  He does not take NovoLog  with evening meal, he is at work during that time starting from 2 PM to 10 PM.  He is snacks with candies at work as well.  He reports taking NovoLog  with breakfast and lunch.  Hemoglobin A1c 7.5%, worsening.  He does not like to take insulin  pen to the work and take insulin  with the evening meal.  No other complaints today.  REVIEW OF SYSTEMS As per history of present illness.   PAST MEDICAL HISTORY: Past Medical History:  Diagnosis Date   Diabetes mellitus type 2    sees Dr. Gwyndolyn Lerner    Hyperlipidemia    Prostate cancer [facility])     PAST SURGICAL HISTORY: Past Surgical History:  Procedure Laterality Date  COLONOSCOPY  05/06/2014   per Dr. Elvin Hammer, clear, repeat in 10 yrs    GOLD SEED IMPLANT N/A 06/16/2023   Procedure: GOLD SEED IMPLANT;  Surgeon: Florencio Hunting, MD;  Location: [facility];  Service: Urology;  Laterality: N/A;   LAPAROSCOPIC APPENDECTOMY N/A 04/06/2017   Procedure: APPENDECTOMY LAPAROSCOPIC with primary repair of umbilical hernia;  Surgeon: Dorena Gander, MD;  Location: [facility] OR;  Service: General;  Laterality: N/A;   PROSTATE BIOPSY     SPACE OAR INSTILLATION N/A 06/16/2023   Procedure: SPACE OAR INSTILLATION;  Surgeon: Florencio Hunting, MD;  Location: [facility];  Service: Urology;  Laterality: N/A;     ALLERGIES: Allergies  Allergen Reactions   Metformin  And Related Nausea And Vomiting    FAMILY HISTORY:  Family History  Problem Relation Age of Onset   Breast cancer Mother    Diabetes Father    Hypertension Father    Alcohol abuse Father    Diabetes Sister    Colon cancer Neg Hx    Pancreatic cancer Neg Hx    Stomach cancer Neg Hx     SOCIAL HISTORY: Social History   Socioeconomic History   Marital status: Single    Spouse name: Not on file   Number of children: Not on file   Years of education: Not on file   Highest education level: Not on file  Occupational History   Not on file  Tobacco Use   Smoking status: Former    Current packs/day: 0.00    Types: Cigarettes    Quit date: 01/30/2012    Years since quitting: 12.2   Smokeless tobacco: Never  Vaping Use   Vaping status: Never Used  Substance and Sexual Activity   Alcohol use: Not Currently   Drug use: No   Sexual activity: Not on file  Other Topics Concern   Not on file  Social History Narrative   Not on file   Social Drivers of Health   Financial Resource Strain: Not on file  Food Insecurity: No Food Insecurity (04/13/2023)   Hunger Vital Sign    Worried About Running Out of Food in the Last Year: Never true    Ran Out of Food in the Last Year: Never true  Transportation Needs: No Transportation Needs (04/13/2023)   PRAPARE - Administrator, Civil Service (Medical): No    Lack of Transportation (Non-Medical): No  Physical Activity: Not on file  Stress: Not on file  Social Connections: Not on file    MEDICATIONS:  Current Outpatient Medications  Medication Sig Dispense Refill   acetaminophen  (TYLENOL ) 500 MG tablet Take 1,000 mg by mouth every 6 (six) hours as needed.     Continuous Glucose Sensor (DEXCOM G7 SENSOR) MISC CHANGE SENSOR EVERY 10 DAYS AS DIRECTED 9 each 3   empagliflozin  (JARDIANCE ) 25 MG TABS tablet Take 1 tablet (25 mg total) by mouth daily before breakfast. 90  tablet 3   glucose blood (ONETOUCH VERIO) test strip 1 each by Other route 2 (two) times daily. And lancets 2/day. 100 each 12   NOVOLOG  FLEXPEN 100 UNIT/ML FlexPen INJECT 8 UNITS SUBCUTANEOUSLY IN THE MORNING AND 12 units AT LUNCH AND 5 units AT SUPPER (Patient taking differently: INJECT 8 UNITS SUBCUTANEOUSLY IN THE MORNING AND 12 units AT LUNCH AND 5 units AT SUPPER, patient only takes morning dosage) 15 mL 4   omeprazole  (PRILOSEC) 40 MG capsule Take 1 capsule (40 mg total) by mouth daily. 90  capsule 0   Semaglutide,0.25 or 0.5MG /DOS, 2 MG/3ML SOPN Inject 0.25 mg into the skin once a week for 4 weeks and increase to 0.5 mg weekly. 3 mL 4   simvastatin  (ZOCOR ) 20 MG tablet TAKE 1 TABLET BY MOUTH AT BEDTIME 90 tablet 0   TRESIBA  FLEXTOUCH 200 UNIT/ML FlexTouch Pen INJECT 50 UNITS SUBCUTANEOUSLY ONCE DAILY 18 mL 4   No current facility-administered medications for this visit.    PHYSICAL EXAM: Vitals:   05/09/24 0814  BP: 126/60  Pulse: 76  Resp: 20  SpO2: 96%  Weight: 227 lb 6.4 oz (103.1 kg)  Height: 5\' 11"  (1.803 m)      Body mass index is 31.72 kg/m.  Wt Readings from Last 3 Encounters:  05/09/24 227 lb 6.4 oz (103.1 kg)  04/16/24 226 lb (102.5 kg)  02/07/24 228 lb 9.6 oz (103.7 kg)    General: Well developed, well nourished male in no apparent distress.  HEENT: AT/[place], no external lesions.  Eyes: Conjunctiva clear and no icterus. Neck: Neck supple  Lungs: Respirations not labored Neurologic: Alert, oriented, normal speech Extremities / Skin: Dry.  Psychiatric: Does not appear depressed or anxious  Diabetic Foot Exam - Simple   No data filed    LABS Reviewed Lab Results  Component Value Date   HGBA1C 7.5 (A) 05/09/2024   HGBA1C 6.6 (A) 02/07/2024   HGBA1C 6.8 (A) 11/09/2023   Lab Results  Component Value Date   FRUCTOSAMINE 319 (H) 12/27/2022   FRUCTOSAMINE 391 (H) 11/03/2021   FRUCTOSAMINE 230 11/19/2019   Lab Results  Component Value Date   CHOL 114  11/09/2023   HDL 34.80 (L) 11/09/2023   LDLCALC 64 11/09/2023   TRIG 78.0 11/09/2023   CHOLHDL 3 11/09/2023   Lab Results  Component Value Date   MICRALBCREAT 2.9 11/09/2023   MICRALBCREAT 7.3 06/03/2022   Lab Results  Component Value Date   CREATININE 0.98 11/09/2023   Lab Results  Component Value Date   GFR 81.26 11/09/2023    ASSESSMENT / PLAN  1. Type 2 diabetes mellitus with hyperglycemia, with long-term current use of insulin  (HCC)   2. Uncontrolled type 2 diabetes mellitus with hyperglycemia (HCC)      Diabetes Mellitus type 2, complicated by no known complications. - Diabetic status / severity: uncontrolled.  Worsening.  Lab Results  Component Value Date   HGBA1C 7.5 (A) 05/09/2024    - Hemoglobin A1c goal : <6.5%  Patient has not been taking NovoLog  for evening meal, he will be at work and he does not like to take insulin  pen at the work.  Discussed that it is preferably to take NovoLog  for evening meal.  Discussed about avoiding candy and sugary snacks at work.  For the breakfast and lunch as well patient is advised to take NovoLog  10 to 15 minutes before eating preferably.  He has no contraindication for GLP-1 receptor agonist with no personal history of pancreatitis, no family Struve pancreatic cancer, MEN 2 syndrome or medullary thyroid  carcinoma.  - Medications: Adjusted diabetes regimen as follows.  - Continue Tresiba  50 units daily. - Continue NovoLog  8 units with breakfast and 10 units with lunch. - Start Ozempic 0.25 mg weekly for 4 weeks and increase to 0.5 mg weekly. - If he continued to have significant hyperglycemia with evening meal we will consider repaglinide with meal in the future. - Continue Jardiance  25 mg daily.  - Home glucose testing: CGM and check blood sugar as needed. - Discussed/  Gave Hypoglycemia treatment plan.  # Consult : not required at this time.   # Annual urine for microalbuminuria/ creatinine ratio, no  microalbuminuria currently.    Last  Lab Results  Component Value Date   MICRALBCREAT 2.9 11/09/2023    # Foot check nightly / neuropathy.  # Annual dilated diabetic eye exams.  Advised to have diabetic eye exam.  - Diet: Make healthy diabetic food choices - Life style / activity / exercise: discussed.  2. Blood pressure  -  BP Readings from Last 1 Encounters:  05/09/24 126/60    - Control is in target.  - No change in current plans.  3. Lipid status / Hyperlipidemia - Last  Lab Results  Component Value Date   LDLCALC 64 11/09/2023   - Continue simvastatin  40 mg daily.    Diagnoses and all orders for this visit:  Type 2 diabetes mellitus with hyperglycemia, with long-term current use of insulin  (HCC) -     POCT glycosylated hemoglobin (Hb A1C) -     Semaglutide,0.25 or 0.5MG /DOS, 2 MG/3ML SOPN; Inject 0.25 mg into the skin once a week for 4 weeks and increase to 0.5 mg weekly.  Uncontrolled type 2 diabetes mellitus with hyperglycemia (HCC)    DISPOSITION Follow up in clinic in 3  months suggested.   All questions answered and patient verbalized understanding of the plan.  [place] Grainne Knights, MD [facility] Endocrinology [facility] [address] [place], [address] [place], [place] [address] Phone # [phone]  At least part of this note was generated using voice recognition software. Inadvertent word errors may have occurred, which were not recognized during the proofreading process.

## 2024-05-09 NOTE — Telephone Encounter (Signed)
 Pharmacy Patient Advocate Encounter   Received notification from CoverMyMeds that prior authorization for Ozempic (0.25 or 0.5 MG/DOSE) 2MG /3ML pen-injectors is required/requested.   Insurance verification completed.   The patient is insured through [employer] [facility] .   Per test claim: PA required; PA submitted to above mentioned insurance via CoverMyMeds Key/confirmation #/EOC [id number] Status is pending

## 2024-05-10 ENCOUNTER — Other Ambulatory Visit (HOSPITAL_COMMUNITY): Payer: Self-pay

## 2024-05-10 NOTE — Telephone Encounter (Signed)
 Pharmacy Patient Advocate Encounter  Received notification from [employer] [facility] that Prior Authorization for Ozempic (0.25 or 0.5 MG/DOSE) 2MG /3ML pen-injectors has been APPROVED from 05/09/2024 to 05/10/2027   PA #/Case ID/Reference #: [id number]

## 2024-06-10 ENCOUNTER — Other Ambulatory Visit: Payer: Self-pay | Admitting: Family Medicine

## 2024-07-18 ENCOUNTER — Other Ambulatory Visit: Payer: Self-pay | Admitting: Family Medicine

## 2024-07-21 ENCOUNTER — Encounter: Payer: Self-pay | Admitting: Internal Medicine

## 2024-07-23 ENCOUNTER — Other Ambulatory Visit (HOSPITAL_COMMUNITY): Payer: Self-pay

## 2024-07-23 ENCOUNTER — Telehealth: Payer: Self-pay

## 2024-07-23 NOTE — Telephone Encounter (Signed)
 Pharmacy Patient Advocate Encounter   Received notification from Onbase that prior authorization for Dexcom G7 sensor is required/requested.   Insurance verification completed.   The patient is insured through [employer] [facility] .   Per test claim: PA required; PA submitted to above mentioned insurance via CoverMyMeds Key/confirmation #/EOC [id number] Status is pending

## 2024-07-25 NOTE — Telephone Encounter (Signed)
 Pharmacy Patient Advocate Encounter  Received notification from [employer] [facility] that Prior Authorization for Dexcom G7 sensor has been APPROVED from 07/23/24 to 07/23/25   PA #/Case ID/Reference #: [id number]

## 2024-07-31 ENCOUNTER — Encounter: Payer: Self-pay | Admitting: Internal Medicine

## 2024-08-23 ENCOUNTER — Encounter: Payer: Self-pay | Admitting: Endocrinology

## 2024-08-23 ENCOUNTER — Ambulatory Visit: Payer: Self-pay | Admitting: Endocrinology

## 2024-08-23 ENCOUNTER — Other Ambulatory Visit: Payer: Self-pay

## 2024-08-23 ENCOUNTER — Ambulatory Visit: Admitting: Endocrinology

## 2024-08-23 VITALS — BP 138/70 | HR 71 | Resp 20 | Ht 71.0 in | Wt 227.4 lb

## 2024-08-23 DIAGNOSIS — Z794 Long term (current) use of insulin: Secondary | ICD-10-CM

## 2024-08-23 DIAGNOSIS — E1165 Type 2 diabetes mellitus with hyperglycemia: Secondary | ICD-10-CM

## 2024-08-23 LAB — POCT GLYCOSYLATED HEMOGLOBIN (HGB A1C): Hemoglobin A1C: 6.6 % — AB (ref 4.0–5.6)

## 2024-08-23 MED ORDER — DEXCOM G7 SENSOR MISC: 3 refills | Status: AC

## 2024-08-23 MED ORDER — OZEMPIC (0.25 OR 0.5 MG/DOSE) 2 MG/3ML ~~LOC~~ SOPN: 0.5000 mg | PEN_INJECTOR | SUBCUTANEOUS | 3 refills | Status: DC

## 2024-08-23 NOTE — Patient Instructions (Addendum)
 Latest Reference Range & Units 05/09/24 08:16 08/23/24 08:15  Hemoglobin A1C 4.0 - 5.6 % 7.5 ! Pend 6.6 !  !: Data is abnormal  Continue tresiba  50 units daily  Take Novolog  as needed as follows for high glucose before eating.   Sliding Scale Blood Glucose        Insulin  60-150                     None 151-200                   None 201-250                   2 units 251-300                   4 units 301-350                   6 units 351-400                   8 units      >400                        9 units and call provider    Jardiance  25 mg daily.  Increase Ozempic  0.5 mg weekly. Call me after 1 months, will increaser to 1 mg.

## 2024-08-23 NOTE — Progress Notes (Signed)
 Outpatient Endocrinology Note [place] Lauralyn Shadowens, MD  08/23/24  Kenneth [name]'s Name: Kenneth [name]    DOB: 1956/04/16    MRN: [id number]                                                    REASON OF VISIT: Follow up  for type 2 diabetes mellitus  PCP: Johnny Garnette DELENA, MD  HISTORY OF PRESENT ILLNESS:   Kenneth [name] is a 68 y.o. old male with past medical history listed below, is here for follow up of type 2 diabetes mellitus.   Pertinent Diabetes History:  _Diagnosed as type 2 in 2008.  He has history of diabetic ketoacidosis in the past around 2011.  He was initially treated with metformin  and then insulin  therapy was started soon after diagnosis.  He has no contraindication for GLP-1 receptor agonist with no personal history of pancreatitis, no family Kenneth [name] pancreatic cancer, MEN 2 syndrome or medullary thyroid  carcinoma.  Chronic Diabetes Complications : Retinopathy: unknown. Last ophthalmology exam was done on due Nephropathy: no Peripheral neuropathy: no Coronary artery disease: no Stroke: no  Relevant comorbidities and cardiovascular risk factors: Obesity: yes Body mass index is 31.72 kg/m.  Hypertension: yes Hyperlipidemia. Yes, on a statin.  Current / Home Diabetic regimen includes: Tresiba  50 units daily. NovoLog  8 units with breakfast, 10 with lunch and none at supper. Jardiance  25 mg daily. Ozempic  0.25 mg weekly.  Prior diabetic medications: Metformin  stopped due to GI intolerance.  Glycemic data:    CONTINUOUS GLUCOSE MONITORING SYSTEM (CGMS) INTERPRETATION: At today's visit, we reviewed CGM downloads. The full report is scanned in the media. Reviewing the CGM trends, blood glucose are as follows:  Dexcom G7 CGM-  Sensor Download (Sensor download was reviewed and summarized below.) Dates: August 22 to August 23, 2024 last 14 days  Glucose Management Indicator: 6.8%  Sensor usage 86%    Previous:    Interpretation: Significant improvement in  blood sugar compared to last visit.  Acceptable blood sugar overnight and in between the meals.  Occasional mild hyperglycemia with blood sugar in low 200s with breakfast and sometimes with late-night eating.  No concerning hypoglycemia.  Hypoglycemia: Kenneth [name] has no hypoglycemic episodes. Kenneth [name] has hypoglycemia awareness.  Factors modifying glucose control: 1.  Diabetic diet assessment: Lately has been eating small meals, especially with lunch and supper.  Evening snacks almost every day with cakes, candies.  2.  Staying active or exercising: Active at work.  Works from 2 PM to 10 PM.  3.  Medication compliance: compliant all of the time.  Interval history Hemoglobin A1c improved to 6.6%, congratulated him.  CGM data as reviewed above, significantly improved compared to last visit.  Diabetes regimen as reviewed and noted above.  He is still on Ozempic  0.25 mg weekly, tolerating well.  He complains of pain with insulin  injections before not to be on insulin  therapy.  He has been injecting insulin  mostly in thighs and sometimes in abdomen.  REVIEW OF SYSTEMS As per history of present illness.   PAST MEDICAL HISTORY: Past Medical History:  Diagnosis Date   Diabetes mellitus type 2    sees [name]    Hyperlipidemia    Prostate cancer [facility])     PAST SURGICAL HISTORY: Past Surgical History:  Procedure Laterality Date   COLONOSCOPY  05/06/2014  per Dr. Abran, clear, repeat in 10 yrs    GOLD SEED IMPLANT N/A 06/16/2023   Procedure: GOLD SEED IMPLANT;  Surgeon: Renda Glance, MD;  Location: [facility];  Service: Urology;  Laterality: N/A;   LAPAROSCOPIC APPENDECTOMY N/A 04/06/2017   Procedure: APPENDECTOMY LAPAROSCOPIC with primary repair of umbilical hernia;  Surgeon: Dann Hummer, MD;  Location: [facility] OR;  Service: General;  Laterality: N/A;   PROSTATE BIOPSY     SPACE OAR INSTILLATION N/A 06/16/2023   Procedure: SPACE OAR INSTILLATION;  Surgeon: Renda Glance, MD;  Location: [facility];  Service: Urology;  Laterality: N/A;    ALLERGIES: Allergies  Allergen Reactions   Metformin  And Related Nausea And Vomiting    FAMILY HISTORY:  Family History  Problem Relation Age of Onset   Breast cancer Mother    Diabetes Father    Hypertension Father    Alcohol abuse Father    Diabetes Sister    Colon cancer Neg Hx    Pancreatic cancer Neg Hx    Stomach cancer Neg Hx     SOCIAL HISTORY: Social History   Socioeconomic History   Marital status: Single    Spouse name: Not on file   Number of children: Not on file   Years of education: Not on file   Highest education level: Not on file  Occupational History   Not on file  Tobacco Use   Smoking status: Former    Current packs/day: 0.00    Types: Cigarettes    Quit date: 01/30/2012    Years since quitting: 12.5   Smokeless tobacco: Never  Vaping Use   Vaping status: Never Used  Substance and Sexual Activity   Alcohol use: Not Currently   Drug use: No   Sexual activity: Not on file  Other Topics Concern   Not on file  Social History Narrative   Not on file   Social Drivers of Health   Financial Resource Strain: Not on file  Food Insecurity: No Food Insecurity (04/13/2023)   Hunger Vital Sign    Worried About Running Out of Food in the Last Year: Never true    Ran Out of Food in the Last Year: Never true  Transportation Needs: No Transportation Needs (04/13/2023)   PRAPARE - Administrator, Civil Service (Medical): No    Lack of Transportation (Non-Medical): No  Physical Activity: Not on file  Stress: Not on file  Social Connections: Not on file    MEDICATIONS:  Current Outpatient Medications  Medication Sig Dispense Refill   acetaminophen  (TYLENOL ) 500 MG tablet Take 1,000 mg by mouth every 6 (six) hours as needed.     empagliflozin  (JARDIANCE ) 25 MG TABS tablet Take 1 tablet (25 mg total) by mouth daily before breakfast. 90 tablet 3    glucose blood (ONETOUCH VERIO) test strip 1 each by Other route 2 (two) times daily. And lancets 2/day. 100 each 12   NOVOLOG  FLEXPEN 100 UNIT/ML FlexPen INJECT 8 UNITS SUBCUTANEOUSLY IN THE MORNING AND 12 units AT LUNCH AND 5 units AT SUPPER 15 mL 4   omeprazole  (PRILOSEC) 40 MG capsule Take 1 capsule by mouth once daily 90 capsule 0   Semaglutide ,0.25 or 0.5MG /DOS, (OZEMPIC , 0.25 OR 0.5 MG/DOSE,) 2 MG/3ML SOPN Inject 0.5 mg into the skin once a week. 9 mL 3   simvastatin  (ZOCOR ) 20 MG tablet TAKE 1 TABLET BY MOUTH AT BEDTIME 90 tablet 0   TRESIBA  FLEXTOUCH 200 UNIT/ML FlexTouch  Pen INJECT 50 UNITS SUBCUTANEOUSLY ONCE DAILY 18 mL 4   Continuous Glucose Sensor (DEXCOM G7 SENSOR) MISC CHANGE SENSOR EVERY 10 DAYS AS DIRECTED 9 each 3   No current facility-administered medications for this visit.    PHYSICAL EXAM: Vitals:   08/23/24 0810  BP: 138/70  Pulse: 71  Resp: 20  SpO2: 98%  Weight: 227 lb 6.4 oz (103.1 kg)  Height: 5' 11 (1.803 m)       Body mass index is 31.72 kg/m.  Wt Readings from Last 3 Encounters:  08/23/24 227 lb 6.4 oz (103.1 kg)  05/09/24 227 lb 6.4 oz (103.1 kg)  04/16/24 226 lb (102.5 kg)    General: Well developed, well nourished male in no apparent distress.  HEENT: AT/[place], no external lesions.  Eyes: Conjunctiva clear and no icterus. Neck: Neck supple  Lungs: Respirations not labored Neurologic: Alert, oriented, normal speech Extremities / Skin: Dry.  Psychiatric: Does not appear depressed or anxious  Diabetic Foot Exam - Simple   No data filed    LABS Reviewed Lab Results  Component Value Date   HGBA1C 6.6 (A) 08/23/2024   HGBA1C 7.5 (A) 05/09/2024   HGBA1C 6.6 (A) 02/07/2024   Lab Results  Component Value Date   FRUCTOSAMINE 319 (H) 12/27/2022   FRUCTOSAMINE 391 (H) 11/03/2021   FRUCTOSAMINE 230 11/19/2019   Lab Results  Component Value Date   CHOL 114 11/09/2023   HDL 34.80 (L) 11/09/2023   LDLCALC 64 11/09/2023   TRIG 78.0  11/09/2023   CHOLHDL 3 11/09/2023   Lab Results  Component Value Date   MICRALBCREAT 7.6 09/18/2014   MICRALBCREAT 1.2 11/03/2006   Lab Results  Component Value Date   CREATININE 0.98 11/09/2023   Lab Results  Component Value Date   GFR 81.26 11/09/2023    ASSESSMENT / PLAN  1. Uncontrolled type 2 diabetes mellitus with hyperglycemia, with long-term current use of insulin  (HCC)     Diabetes Mellitus type 2, complicated by no known complications. - Diabetic status / severity: Fair control  Lab Results  Component Value Date   HGBA1C 6.6 (A) 08/23/2024    - Hemoglobin A1c goal : <6.5%  Congratulated him, significant improvement on diabetes control.  Mostly acceptable blood sugar on CGM as reviewed above.  He complains of pain/burning sensation with insulin  injection.  Will gradually increase dose of Ozempic  and decrease insulin .  - Medications: Adjusted diabetes regimen as follows.  - Continue Tresiba  50 units daily. - Stop scheduled dose of NovoLog . Take Novolog  as needed as follows for high glucose before eating.   Sliding Scale Blood Glucose        Insulin  60-150                     None 151-200                   None 201-250                   2 units 251-300                   4 units 301-350                   6 units 351-400                   8 units      >400  9 units and call provider   -Increase Ozempic  from 0.25 to 0.5 mg weekly.  Asked Kenneth [name] to call after a month we will increase the dose of Ozempic  in between the visit. - Continue Jardiance  25 mg daily.  - Home glucose testing: CGM and check blood sugar as needed.  - Discussed/ Gave Hypoglycemia treatment plan.  # Consult : not required at this time.   # Annual urine for microalbuminuria/ creatinine ratio, no microalbuminuria currently.  Will check today.  Last  Lab Results  Component Value Date   MICRALBCREAT 7.6 09/18/2014    # Foot check nightly / neuropathy.  #  Annual dilated diabetic eye exams.  Advised to have diabetic eye exam.  - Diet: Make healthy diabetic food choices - Life style / activity / exercise: discussed.  2. Blood pressure  -  BP Readings from Last 1 Encounters:  08/23/24 138/70    - Control is in target.  - No change in current plans.  3. Lipid status / Hyperlipidemia - Last  Lab Results  Component Value Date   LDLCALC 64 11/09/2023   - Continue simvastatin  40 mg daily.  Managed by primary care provider.  Diagnoses and all orders for this visit:  Uncontrolled type 2 diabetes mellitus with hyperglycemia, with long-term current use of insulin  (HCC) -     POCT glycosylated hemoglobin (Hb A1C) -     Semaglutide ,0.25 or 0.5MG /DOS, (OZEMPIC , 0.25 OR 0.5 MG/DOSE,) 2 MG/3ML SOPN; Inject 0.5 mg into the skin once a week. -     Microalbumin / creatinine urine ratio   DISPOSITION Follow up in clinic in 3  months suggested.   All questions answered and Kenneth [name] verbalized understanding of the plan.  [place] Matvey Llanas, MD [facility] Endocrinology [facility] [address] [place], [address] [place], [place] [address] Phone # [phone]  At least part of this note was generated using voice recognition software. Inadvertent word errors may have occurred, which were not recognized during the proofreading process.

## 2024-08-24 LAB — MICROALBUMIN / CREATININE URINE RATIO
Creatinine, Urine: 124 mg/dL (ref 20–320)
Microalb Creat Ratio: 93 mg/g{creat} — ABNORMAL HIGH (ref <30)
Microalb, Ur: 11.5 mg/dL

## 2024-08-29 ENCOUNTER — Ambulatory Visit (AMBULATORY_SURGERY_CENTER)

## 2024-08-29 ENCOUNTER — Encounter: Payer: Self-pay | Admitting: Internal Medicine

## 2024-08-29 VITALS — Ht 71.0 in | Wt 227.0 lb

## 2024-08-29 DIAGNOSIS — Z1211 Encounter for screening for malignant neoplasm of colon: Secondary | ICD-10-CM

## 2024-08-29 MED ORDER — NA SULFATE-K SULFATE-MG SULF 17.5-3.13-1.6 GM/177ML PO SOLN: 1.0000 | Freq: Once | ORAL | 0 refills | Status: AC

## 2024-08-29 NOTE — Patient Instructions (Signed)
 MEDICATION INSTRUCTIONS  Unless otherwise instructed, you should take regular prescription medications with a small sip of water as early as possible the morning of your procedure. You may continue taking your Aspirin . Hold all NSAIDS to include Meloxicam, Motrin, Celebrex, Anaprox, Naproxen, Advil, Ibuprofen, Aleve, Diclofenac, BC Powders, Goody Powders etc for 7 days prior to the colonoscopy- you may take Tylenol  (Acetaminophen ) products.   Take allowed medicines by 4:30 AM the day of your procedure  ORAL DIABETIC MEDICATION INSTRUCTIONS Metformin  Glipizide Jardiance  Glimepiride Farixga Januvia  Rybelsus , Xigduo, Sitagliptin, Synjardy Tradjenta Actos, Alogliptin Invokana  The day before your procedure: 9/23  Do not take your diabetic pill   The day of your procedure: 9/24  Do not take your diabetic pill   We will check your blood sugar levels during the admission process and again in Recovery before discharging you home  _____________________________________________________________________________   INSULIN  (LONG ACTING) MEDICATION INSTRUCTIONS Lantus , Levemir, Basaglar , NPH, 70/30, Humulin, Novolin-N, Toujeo ,Tresiba , Xultophy, Soliqua  The day before your procedure: 9/23  Take  your regular daily dose   The day of your procedure: 9/24  Do not take your morning dose We will check your blood sugar levels during the admission process and again in Recovery before discharging you home  _______________________________________________________________________________________     ONCE A WEEK INJECTIONS Ozempic ,  Mounjaro, Wegovy , Trulicity, Tanzeum, Byetta, Victoza, Bydureon, & SymlinPen  -DO NOT TAKE 7 days prior to the procedure.  Last dose on or before Tuesday 9/16 failure to hold this medication will result in a cancellation or rescheduling of your procedure

## 2024-08-29 NOTE — Progress Notes (Signed)

## 2024-09-05 ENCOUNTER — Other Ambulatory Visit: Payer: Self-pay | Admitting: Family Medicine

## 2024-09-11 ENCOUNTER — Telehealth: Payer: Self-pay | Admitting: Internal Medicine

## 2024-09-11 NOTE — Telephone Encounter (Signed)
 Returned the patient's phone call and reviewed the prep instructions with him. Pt verbalized understanding.

## 2024-09-11 NOTE — Telephone Encounter (Signed)
 Patient requesting to speak with a nurse in regards to prep instructions.

## 2024-09-12 ENCOUNTER — Ambulatory Visit: Admitting: Internal Medicine

## 2024-09-12 ENCOUNTER — Encounter: Payer: Self-pay | Admitting: Internal Medicine

## 2024-09-12 VITALS — BP 115/71 | HR 70 | Temp 97.5°F | Resp 12 | Ht 71.0 in | Wt 227.0 lb

## 2024-09-12 DIAGNOSIS — Z1211 Encounter for screening for malignant neoplasm of colon: Secondary | ICD-10-CM

## 2024-09-12 MED ORDER — SODIUM CHLORIDE 0.9 % IV SOLN: 500.0000 mL | Freq: Once | INTRAVENOUS | Status: DC

## 2024-09-12 NOTE — Progress Notes (Signed)
 HISTORY OF PRESENT ILLNESS:  Kenneth [name] is a 68 y.o. male presents today for screen colonoscopy.  Previous examination 2015 was normal.  REVIEW OF SYSTEMS:  All non-GI ROS negative except for  Past Medical History:  Diagnosis Date   Diabetes mellitus type 2    sees [name]    Hyperlipidemia    Prostate cancer [facility])     Past Surgical History:  Procedure Laterality Date   COLONOSCOPY  05/06/2014   per Dr. Abran, clear, repeat in 10 yrs    GOLD SEED IMPLANT N/A 06/16/2023   Procedure: GOLD SEED IMPLANT;  Surgeon: Renda Glance, MD;  Location: [facility];  Service: Urology;  Laterality: N/A;   LAPAROSCOPIC APPENDECTOMY N/A 04/06/2017   Procedure: APPENDECTOMY LAPAROSCOPIC with primary repair of umbilical hernia;  Surgeon: Dann Hummer, MD;  Location: [facility] OR;  Service: General;  Laterality: N/A;   PROSTATE BIOPSY     SPACE OAR INSTILLATION N/A 06/16/2023   Procedure: SPACE OAR INSTILLATION;  Surgeon: Renda Glance, MD;  Location: [facility];  Service: Urology;  Laterality: N/A;    Social History [name]  reports that he quit smoking about 12 years ago. His smoking use included cigarettes. He has never used smokeless tobacco. He reports that he does not currently use alcohol. He reports that he does not use drugs.  family history includes Alcohol abuse in his father; Breast cancer in his mother; Diabetes in his father and sister; Hypertension in his father.  Allergies  Allergen Reactions   Metformin  And Related Nausea And Vomiting       PHYSICAL EXAMINATION: Vital signs: BP 125/77   Pulse 78   Temp (!) 97.5 F (36.4 C) (Temporal)   Ht 5' 11 (1.803 m)   Wt 227 lb (103 kg)   SpO2 98%   BMI 31.66 kg/m  General: Well-developed, well-nourished, no acute distress HEENT: Sclerae are anicteric, conjunctiva pink. Oral mucosa intact Lungs: Clear Heart: Regular Abdomen: soft, nontender, nondistended, no obvious ascites,  no peritoneal signs, normal bowel sounds. No organomegaly. Extremities: No edema Psychiatric: alert and oriented x3. Cooperative     ASSESSMENT:  Colon cancer screening   PLAN:  Screening colonoscopy

## 2024-09-12 NOTE — Progress Notes (Signed)
 Pt's states no medical or surgical changes since previsit or office visit.

## 2024-09-12 NOTE — Patient Instructions (Signed)
  Resume previous diet  Continue present medications  Repeat colonoscopy in 10 years for screening purposes   YOU HAD AN ENDOSCOPIC PROCEDURE TODAY AT THE [facility]:   Refer to the procedure report that was given to you for any specific questions about what was found during the examination.  If the procedure report does not answer your questions, please call your gastroenterologist to clarify.  If you requested that your care partner not be given the details of your procedure findings, then the procedure report has been included in a sealed envelope for you to review at your convenience later.  YOU SHOULD EXPECT: Some feelings of bloating in the abdomen. Passage of more gas than usual.  Walking can help get rid of the air that was put into your GI tract during the procedure and reduce the bloating. If you had a lower endoscopy (such as a colonoscopy or flexible sigmoidoscopy) you may notice spotting of blood in your stool or on the toilet paper. If you underwent a bowel prep for your procedure, you may not have a normal bowel movement for a few days.  Please Note:  You might notice some irritation and congestion in your nose or some drainage.  This is from the oxygen used during your procedure.  There is no need for concern and it should clear up in a day or so.  SYMPTOMS TO REPORT IMMEDIATELY:  Following lower endoscopy (colonoscopy or flexible sigmoidoscopy):  Excessive amounts of blood in the stool  Significant tenderness or worsening of abdominal pains  Swelling of the abdomen that is new, acute  Fever of 100F or higher  For urgent or emergent issues, a gastroenterologist can be reached at any hour by calling [phone]. Do not use MyChart messaging for urgent concerns.    DIET:  We do recommend a small meal at first, but then you may proceed to your regular diet.  Drink plenty of fluids but you should avoid alcoholic beverages for 24 hours.  ACTIVITY:  You should  plan to take it easy for the rest of today and you should NOT DRIVE or use heavy machinery until tomorrow (because of the sedation medicines used during the test).    FOLLOW UP: Our staff will call the number listed on your records the next business day following your procedure.  We will call around 7:15- 8:00 am to check on you and address any questions or concerns that you may have regarding the information given to you following your procedure. If we do not reach you, we will leave a message.     If any biopsies were taken you will be contacted by phone or by letter within the next 1-3 weeks.  Please call us at [phone] if you have not heard about the biopsies in 3 weeks.    SIGNATURES/CONFIDENTIALITY: You and/or your care partner have signed paperwork which will be entered into your electronic medical record.  These signatures attest to the fact that that the information above on your After Visit Summary has been reviewed and is understood.  Full responsibility of the confidentiality of this discharge information lies with you and/or your care-partner.

## 2024-09-12 NOTE — Progress Notes (Signed)
 Vss nad trans to pacu

## 2024-09-12 NOTE — Op Note (Signed)
 [facility] Patient Name: Kenneth [name] Procedure Date: 09/12/2024 8:50 AM MRN: [id number] Endoscopist: Norleen SAILOR. Abran , MD, [id number] Age: 68 Referring MD:  Date of Birth: Jul 02, 1956 Gender: Male Account #: [id number] Procedure:                Colonoscopy Indications:              Screening for colorectal malignant neoplasm. Index                            exam 2015 was normal Medicines:                Monitored Anesthesia Care Procedure:                Pre-Anesthesia Assessment:                           - Prior to the procedure, a History and Physical                            was performed, and patient medications and                            allergies were reviewed. The patient's tolerance of                            previous anesthesia was also reviewed. The risks                            and benefits of the procedure and the sedation                            options and risks were discussed with the patient.                            All questions were answered, and informed consent                            was obtained. Prior Anticoagulants: The patient has                            taken no anticoagulant or antiplatelet agents. ASA                            Grade Assessment: II - A patient with mild systemic                            disease. After reviewing the risks and benefits,                            the patient was deemed in satisfactory condition to                            undergo the procedure.  After obtaining informed consent, the colonoscope                            was passed under direct vision. Throughout the                            procedure, the patient's blood pressure, pulse, and                            oxygen saturations were monitored continuously. The                            CF [id number] #[id number] was introduced through the anus                            and advanced to the the cecum,  identified by                            appendiceal orifice and ileocecal valve. The                            ileocecal valve, appendiceal orifice, and rectum                            were photographed. The quality of the bowel                            preparation was excellent. The colonoscopy was                            performed without difficulty. The patient tolerated                            the procedure well. The bowel preparation used was                            SUPREP via split dose instruction. Scope In: 9:11:12 AM Scope Out: 9:21:06 AM Scope Withdrawal Time: 0 hours 7 minutes 40 seconds  Total Procedure Duration: 0 hours 9 minutes 54 seconds  Findings:                 The entire examined colon appeared normal on direct                            and retroflexion views. Complications:            No immediate complications. Estimated blood loss:                            None. Estimated Blood Loss:     Estimated blood loss: none. Impression:               - The entire examined colon is normal on direct and  retroflexion views.                           - No specimens collected. Recommendation:           - Repeat colonoscopy in 10 years for screening                            purposes.                           - Patient has a contact number available for                            emergencies. The signs and symptoms of potential                            delayed complications were discussed with the                            patient. Return to normal activities tomorrow.                            Written discharge instructions were provided to the                            patient.                           - Resume previous diet.                           - Continue present medications. Norleen SAILOR. Abran, MD 09/12/2024 9:23:54 AM This report has been signed electronically.

## 2024-09-13 ENCOUNTER — Telehealth: Payer: Self-pay | Admitting: Lactation Services

## 2024-09-13 NOTE — Telephone Encounter (Signed)
  Follow up Call-     09/12/2024    7:35 AM  Call back number  Post procedure Call Back phone  # [phone]  Permission to leave phone message Yes     Patient questions:  Do you have a fever, pain , or abdominal swelling? No. Pain Score  0 *  Have you tolerated food without any problems? Yes.    Have you been able to return to your normal activities? Yes.    Do you have any questions about your discharge instructions: Diet   No. Medications  No. Follow up visit  No.  Do you have questions or concerns about your Care? No.  Actions: * If pain score is 4 or above: No action needed, pain <4.

## 2024-10-10 ENCOUNTER — Other Ambulatory Visit: Payer: Self-pay | Admitting: Family Medicine

## 2024-11-21 ENCOUNTER — Encounter: Payer: Self-pay | Admitting: Endocrinology

## 2024-11-21 ENCOUNTER — Ambulatory Visit: Admitting: Endocrinology

## 2024-11-21 ENCOUNTER — Ambulatory Visit: Payer: Self-pay | Admitting: Endocrinology

## 2024-11-21 VITALS — BP 126/72 | HR 73 | Resp 20 | Ht 71.0 in | Wt 224.2 lb

## 2024-11-21 DIAGNOSIS — Z794 Long term (current) use of insulin: Secondary | ICD-10-CM

## 2024-11-21 DIAGNOSIS — E1165 Type 2 diabetes mellitus with hyperglycemia: Secondary | ICD-10-CM

## 2024-11-21 DIAGNOSIS — E119 Type 2 diabetes mellitus without complications: Secondary | ICD-10-CM

## 2024-11-21 LAB — POCT GLYCOSYLATED HEMOGLOBIN (HGB A1C): Hemoglobin A1C: 5.7 % — AB (ref 4.0–5.6)

## 2024-11-21 MED ORDER — TRESIBA FLEXTOUCH 200 UNIT/ML ~~LOC~~ SOPN: 36.0000 [IU] | PEN_INJECTOR | SUBCUTANEOUS | 4 refills | Status: AC

## 2024-11-21 MED ORDER — OZEMPIC (0.25 OR 0.5 MG/DOSE) 2 MG/3ML ~~LOC~~ SOPN: 0.5000 mg | PEN_INJECTOR | SUBCUTANEOUS | 3 refills | Status: DC

## 2024-11-21 MED ORDER — EMPAGLIFLOZIN 25 MG PO TABS: 25.0000 mg | ORAL_TABLET | Freq: Every day | ORAL | 3 refills | Status: AC

## 2024-11-21 MED ORDER — SEMAGLUTIDE (1 MG/DOSE) 4 MG/3ML ~~LOC~~ SOPN: 1.0000 mg | PEN_INJECTOR | SUBCUTANEOUS | 3 refills | Status: AC

## 2024-11-21 NOTE — Patient Instructions (Addendum)
 Latest Reference Range & Units 02/07/24 08:31 05/09/24 08:16 08/23/24 08:15 11/21/24 08:26  Hemoglobin A1C 4.0 - 5.6 % 6.6 ! Pend 7.5 ! Pend 6.6 ! Pend 5.7 !  !: Data is abnormal  Decrease Tresiba  36 units daily  Take Novolog  as needed as follows for high glucose before eating.   Sliding Scale Blood Glucose        Insulin  60-150                     None 151-200                   None 201-250                   2 units 251-300                   4 units 301-350                   6 units 351-400                   8 units      >400                        9 units and call provider    Jardiance  25 mg daily.  Increase Ozempic  to 1 mg weekly.

## 2024-11-21 NOTE — Progress Notes (Signed)
 Outpatient Endocrinology Note Zyah Gomm, MD  11/21/24  Patient's Name: Kenneth [name]    DOB: 03-Aug-1956    MRN: [id number]                                                    REASON OF VISIT: Follow up  for type 2 diabetes mellitus  PCP: Johnny Garnette DELENA, MD  HISTORY OF PRESENT ILLNESS:   Kenneth [name] is a 68 y.o. old male with past medical history listed below, is here for follow up of type 2 diabetes mellitus.   Pertinent Diabetes History:  _Diagnosed as type 2 in 2008.  He has history of diabetic ketoacidosis in the past around 2011.  He was initially treated with metformin  and then insulin  therapy was started soon after diagnosis.  He has no contraindication for GLP-1 receptor agonist with no personal history of pancreatitis, no family Struve pancreatic cancer, MEN 2 syndrome or medullary thyroid  carcinoma.  Chronic Diabetes Complications : Retinopathy: unknown. Last ophthalmology exam was done on due Nephropathy: no Peripheral neuropathy: no Coronary artery disease: no Stroke: no  Relevant comorbidities and cardiovascular risk factors: Obesity: yes Body mass index is 31.27 kg/m.  Hypertension: yes Hyperlipidemia. Yes, on a statin.  Current / Home Diabetic regimen includes: Tresiba  50 units daily. NovoLog  sliding scale, no longer requiring. Jardiance  25 mg daily. Ozempic  0.5 mg weekly.  Prior diabetic medications: Metformin  stopped due to GI intolerance.  NovoLog  with meals stopped due to improvement of diabetes control, also burning sensation with injection.  Glycemic data:    CONTINUOUS GLUCOSE MONITORING SYSTEM (CGMS) INTERPRETATION: At today's visit, we reviewed CGM downloads. The full report is scanned in the media. Reviewing the CGM trends, blood glucose are as follows:  Dexcom G7 CGM-  Sensor Download (Sensor download was reviewed and summarized below.) Dates: November 20 to November 21, 2024 last 14  days    Previous:    Interpretation: Mostly acceptable blood sugar.  Occasional mild hyperglycemia with blood sugar in low 200s postprandially related to high carb meal.  Rare mild hypoglycemia in the late afternoon and occasionally overnight.  No concerning hypoglycemia.  Hypoglycemia: Patient has no hypoglycemic episodes. Patient has hypoglycemia awareness.  Factors modifying glucose control: 1.  Diabetic diet assessment: Lately has been eating small meals, especially with lunch and supper.  Evening snacks almost every day with cakes, candies.  2.  Staying active or exercising: Active at work.  Works from 2 PM to 10 PM.  3.  Medication compliance: compliant all of the time.  Interval history Hemoglobin A1c improved to 5.7% congratulated him.  CGM data as reviewed above.  He is no longer requiring NovoLog .  He has been taking Ozempic  0.5 mg weekly however last 2 to 3 weeks he has been taking 0.25 mg of Ozempic .  He has been tolerating Ozempic  well, denies any GI issues.  No numbness and ting of the feet.  No vision problem.  No other complaints today.  REVIEW OF SYSTEMS As per history of present illness.   PAST MEDICAL HISTORY: Past Medical History:  Diagnosis Date   Diabetes mellitus type 2    sees [name]    Hyperlipidemia    Prostate cancer [facility])     PAST SURGICAL HISTORY: Past Surgical History:  Procedure Laterality Date   COLONOSCOPY  05/06/2014   per Dr. Abran, clear, repeat in 10 yrs    GOLD SEED IMPLANT N/A 06/16/2023   Procedure: GOLD SEED IMPLANT;  Surgeon: Renda Glance, MD;  Location: [facility];  Service: Urology;  Laterality: N/A;   LAPAROSCOPIC APPENDECTOMY N/A 04/06/2017   Procedure: APPENDECTOMY LAPAROSCOPIC with primary repair of umbilical hernia;  Surgeon: Dann Hummer, MD;  Location: [facility] OR;  Service: General;  Laterality: N/A;   PROSTATE BIOPSY     SPACE OAR INSTILLATION N/A 06/16/2023   Procedure: SPACE OAR INSTILLATION;   Surgeon: Renda Glance, MD;  Location: [facility];  Service: Urology;  Laterality: N/A;    ALLERGIES: Allergies  Allergen Reactions   Metformin  And Related Nausea And Vomiting    FAMILY HISTORY:  Family History  Problem Relation Age of Onset   Breast cancer Mother    Diabetes Father    Hypertension Father    Alcohol abuse Father    Diabetes Sister    Colon cancer Neg Hx    Pancreatic cancer Neg Hx    Stomach cancer Neg Hx     SOCIAL HISTORY: Social History   Socioeconomic History   Marital status: Single    Spouse name: Not on file   Number of children: Not on file   Years of education: Not on file   Highest education level: Not on file  Occupational History   Not on file  Tobacco Use   Smoking status: Former    Current packs/day: 0.00    Types: Cigarettes    Quit date: 01/30/2012    Years since quitting: 12.8   Smokeless tobacco: Never  Vaping Use   Vaping status: Never Used  Substance and Sexual Activity   Alcohol use: Not Currently   Drug use: No   Sexual activity: Not on file  Other Topics Concern   Not on file  Social History Narrative   Not on file   Social Drivers of Health   Financial Resource Strain: Not on file  Food Insecurity: No Food Insecurity (04/13/2023)   Hunger Vital Sign    Worried About Running Out of Food in the Last Year: Never true    Ran Out of Food in the Last Year: Never true  Transportation Needs: No Transportation Needs (04/13/2023)   PRAPARE - Administrator, Civil Service (Medical): No    Lack of Transportation (Non-Medical): No  Physical Activity: Not on file  Stress: Not on file  Social Connections: Not on file    MEDICATIONS:  Current Outpatient Medications  Medication Sig Dispense Refill   acetaminophen  (TYLENOL ) 500 MG tablet Take 1,000 mg by mouth every 6 (six) hours as needed.     Continuous Glucose Sensor (DEXCOM G7 SENSOR) MISC CHANGE SENSOR EVERY 10 DAYS AS DIRECTED 9 each 3    glucose blood (ONETOUCH VERIO) test strip 1 each by Other route 2 (two) times daily. And lancets 2/day. 100 each 12   NOVOLOG  FLEXPEN 100 UNIT/ML FlexPen INJECT 8 UNITS SUBCUTANEOUSLY IN THE MORNING AND 12 units AT LUNCH AND 5 units AT SUPPER 15 mL 4   omeprazole  (PRILOSEC) 40 MG capsule Take 1 capsule by mouth once daily 90 capsule 0   Semaglutide , 1 MG/DOSE, 4 MG/3ML SOPN Inject 1 mg as directed once a week. 9 mL 3   simvastatin  (ZOCOR ) 20 MG tablet TAKE 1 TABLET BY MOUTH AT BEDTIME 90 tablet 3   empagliflozin  (JARDIANCE ) 25 MG TABS tablet Take 1 tablet (25 mg  total) by mouth daily before breakfast. 90 tablet 3   TRESIBA  FLEXTOUCH 200 UNIT/ML FlexTouch Pen Inject 36 Units into the skin daily. INJECT 50 UNITS SUBCUTANEOUSLY ONCE DAILY 18 mL 4   No current facility-administered medications for this visit.    PHYSICAL EXAM: Vitals:   11/21/24 0814  BP: 126/72  Pulse: 73  Resp: 20  SpO2: 97%  Weight: 224 lb 3.2 oz (101.7 kg)  Height: 5' 11 (1.803 m)       Body mass index is 31.27 kg/m.  Wt Readings from Last 3 Encounters:  11/21/24 224 lb 3.2 oz (101.7 kg)  09/12/24 227 lb (103 kg)  08/29/24 227 lb (103 kg)    General: Well developed, well nourished male in no apparent distress.  HEENT: AT/[place], no external lesions.  Eyes: Conjunctiva clear and no icterus. Neck: Neck supple  Lungs: Respirations not labored Neurologic: Alert, oriented, normal speech Extremities / Skin: Dry.  Psychiatric: Does not appear depressed or anxious  Diabetic Foot Exam - Simple   Simple Foot Form Diabetic Foot exam was performed with the following findings: Yes 11/21/2024  8:28 AM  Visual Inspection No deformities, no ulcerations, no other skin breakdown bilaterally: Yes See comments: Yes Sensation Testing Intact to touch and monofilament testing bilaterally: Yes Pulse Check Posterior Tibialis and Dorsalis pulse intact bilaterally: Yes Comments Dystrophic nails+ bilaterally.    LABS  Reviewed Lab Results  Component Value Date   HGBA1C 5.7 (A) 11/21/2024   HGBA1C 6.6 (A) 08/23/2024   HGBA1C 7.5 (A) 05/09/2024   Lab Results  Component Value Date   FRUCTOSAMINE 319 (H) 12/27/2022   FRUCTOSAMINE 391 (H) 11/03/2021   FRUCTOSAMINE 230 11/19/2019   Lab Results  Component Value Date   CHOL 114 11/09/2023   HDL 34.80 (L) 11/09/2023   LDLCALC 64 11/09/2023   TRIG 78.0 11/09/2023   CHOLHDL 3 11/09/2023   Lab Results  Component Value Date   MICRALBCREAT 93 (H) 08/23/2024   MICRALBCREAT 7.6 09/18/2014   Lab Results  Component Value Date   CREATININE 0.98 11/09/2023   Lab Results  Component Value Date   GFR 81.26 11/09/2023    ASSESSMENT / PLAN  1. Type 2 diabetes mellitus with hyperglycemia, with long-term current use of insulin  (HCC)   2. Controlled type 2 diabetes mellitus (HCC)    Diabetes Mellitus type 2, complicated by no known complications. - Diabetic status / severity: control  Lab Results  Component Value Date   HGBA1C 5.7 (A) 11/21/2024    - Hemoglobin A1c goal : <6.5%  Congratulated him, significant improvement on diabetes control.  Mostly acceptable blood sugar on CGM as reviewed above.  - Medications: Adjusted diabetes regimen as follows.  - Decrease Tresiba  from 50 to 36 units daily. - Increase Ozempic  from 0.5 to 1 mg weekly.  -Take Novolog  as needed as follows for high glucose before eating.   Sliding Scale Blood Glucose        Insulin  60-150                     None 151-200                   None 201-250                   2 units 251-300                   4 units 301-350  6 units 351-400                   8 units      >400                        9 units and call provider   - Continue Jardiance  25 mg daily.  - Home glucose testing: CGM and check blood sugar as needed.  - Discussed/ Gave Hypoglycemia treatment plan.  # Consult : not required at this time.   # Annual urine for microalbuminuria/  creatinine ratio, + microalbuminuria currently.    Last  Lab Results  Component Value Date   MICRALBCREAT 93 (H) 08/23/2024    # Foot check nightly / neuropathy.  Offered podiatry referral, he has dystrophic nails.  He declined.  # Annual dilated diabetic eye exams.  Advised to have diabetic eye exam.  - Diet: Make healthy diabetic food choices - Life style / activity / exercise: discussed.  2. Blood pressure  -  BP Readings from Last 1 Encounters:  11/21/24 126/72    - Control is in target.  - No change in current plans.  3. Lipid status / Hyperlipidemia - Last  Lab Results  Component Value Date   LDLCALC 64 11/09/2023   - Continue simvastatin  40 mg daily.  Managed by primary care provider.  Diagnoses and all orders for this visit:  Type 2 diabetes mellitus with hyperglycemia, with long-term current use of insulin  (HCC) -     POCT glycosylated hemoglobin (Hb A1C) -     Semaglutide , 1 MG/DOSE, 4 MG/3ML SOPN; Inject 1 mg as directed once a week. -     empagliflozin  (JARDIANCE ) 25 MG TABS tablet; Take 1 tablet (25 mg total) by mouth daily before breakfast.  Controlled type 2 diabetes mellitus (HCC) -     Discontinue: Semaglutide ,0.25 or 0.5MG /DOS, (OZEMPIC , 0.25 OR 0.5 MG/DOSE,) 2 MG/3ML SOPN; Inject 0.5 mg into the skin once a week. -     TRESIBA  FLEXTOUCH 200 UNIT/ML FlexTouch Pen; Inject 36 Units into the skin daily. INJECT 50 UNITS SUBCUTANEOUSLY ONCE DAILY   DISPOSITION Follow up in clinic in 4 months suggested.   All questions answered and patient verbalized understanding of the plan.  Marcee Jacobs, MD [facility] [address] [place], [address] [place], [place] [address] Phone # [phone]  At least part of this note was generated using voice recognition software. Inadvertent word errors may have occurred, which were not recognized during the proofreading process.

## 2025-03-22 ENCOUNTER — Ambulatory Visit: Admitting: Endocrinology

## 2025-03-26 ENCOUNTER — Ambulatory Visit: Admitting: Endocrinology
